# Patient Record
Sex: Male | Born: 1975 | Race: White | Hispanic: No | Marital: Married | Smoking: Current every day smoker
Health system: Southern US, Community
[De-identification: ages and names within clinical notes are randomized; demographics above are authoritative.]

## PROBLEM LIST (undated history)

## (undated) DIAGNOSIS — E785 Hyperlipidemia, unspecified: Secondary | ICD-10-CM

## (undated) DIAGNOSIS — I639 Cerebral infarction, unspecified: Secondary | ICD-10-CM

## (undated) DIAGNOSIS — E162 Hypoglycemia, unspecified: Secondary | ICD-10-CM

## (undated) DIAGNOSIS — Z9889 Other specified postprocedural states: Secondary | ICD-10-CM

## (undated) DIAGNOSIS — R112 Nausea with vomiting, unspecified: Secondary | ICD-10-CM

## (undated) HISTORY — PX: APPENDECTOMY: SHX54

## (undated) HISTORY — PX: HERNIA REPAIR: SHX51

---

## 2006-11-13 ENCOUNTER — Inpatient Hospital Stay (HOSPITAL_COMMUNITY): Admission: EM | Admit: 2006-11-13 | Discharge: 2006-11-14 | Payer: Self-pay | Admitting: Emergency Medicine

## 2006-11-13 ENCOUNTER — Encounter (INDEPENDENT_AMBULATORY_CARE_PROVIDER_SITE_OTHER): Payer: Self-pay | Admitting: *Deleted

## 2008-07-03 ENCOUNTER — Emergency Department (HOSPITAL_COMMUNITY): Admission: EM | Admit: 2008-07-03 | Discharge: 2008-07-03 | Payer: Self-pay | Admitting: Emergency Medicine

## 2009-10-23 ENCOUNTER — Emergency Department (HOSPITAL_COMMUNITY): Admission: EM | Admit: 2009-10-23 | Discharge: 2009-10-23 | Payer: Self-pay | Admitting: Emergency Medicine

## 2010-01-10 ENCOUNTER — Emergency Department (HOSPITAL_COMMUNITY): Admission: EM | Admit: 2010-01-10 | Discharge: 2010-01-10 | Payer: Self-pay | Admitting: Emergency Medicine

## 2010-05-13 ENCOUNTER — Emergency Department (HOSPITAL_COMMUNITY): Admission: EM | Admit: 2010-05-13 | Discharge: 2010-05-14 | Payer: Self-pay | Admitting: Emergency Medicine

## 2010-06-18 ENCOUNTER — Emergency Department (HOSPITAL_COMMUNITY): Admission: EM | Admit: 2010-06-18 | Discharge: 2010-06-18 | Payer: Self-pay | Admitting: Emergency Medicine

## 2010-10-15 ENCOUNTER — Emergency Department (HOSPITAL_COMMUNITY)
Admission: EM | Admit: 2010-10-15 | Discharge: 2010-10-15 | Payer: Self-pay | Source: Home / Self Care | Admitting: Emergency Medicine

## 2011-01-18 LAB — POCT I-STAT, CHEM 8
Creatinine, Ser: 1.2 mg/dL (ref 0.4–1.5)
HCT: 52 % (ref 39.0–52.0)
Hemoglobin: 17.7 g/dL — ABNORMAL HIGH (ref 13.0–17.0)
TCO2: 25 mmol/L (ref 0–100)

## 2011-01-18 LAB — URINE MICROSCOPIC-ADD ON

## 2011-01-18 LAB — URINALYSIS, ROUTINE W REFLEX MICROSCOPIC
Bilirubin Urine: NEGATIVE
Glucose, UA: NEGATIVE mg/dL
Ketones, ur: NEGATIVE mg/dL
Leukocytes, UA: NEGATIVE
Nitrite: NEGATIVE
Specific Gravity, Urine: 1.02 (ref 1.005–1.030)
Urobilinogen, UA: 0.2 mg/dL (ref 0.0–1.0)
pH: 6 (ref 5.0–8.0)

## 2011-01-20 LAB — BASIC METABOLIC PANEL
BUN: 7 mg/dL (ref 6–23)
CO2: 26 mEq/L (ref 19–32)
Calcium: 10.2 mg/dL (ref 8.4–10.5)
GFR calc Af Amer: >60 mL/min (ref >60)
GFR calc non Af Amer: >60 mL/min (ref >60)

## 2011-01-20 LAB — POCT CARDIAC MARKERS
Myoglobin, poc: 50 ng/mL (ref 12–200)
Troponin i, poc: <0.05 ng/mL (ref 0.00–0.09)

## 2011-01-20 LAB — DIFFERENTIAL
Basophils Absolute: 0.1 10*3/uL (ref 0.0–0.1)
Eosinophils Relative: 3 % (ref 0–5)
Lymphocytes Relative: 32 % (ref 12–46)
Lymphs Abs: 2.8 10*3/uL (ref 0.7–4.0)
Neutro Abs: 5 10*3/uL (ref 1.7–7.7)
Neutrophils Relative %: 58 % (ref 43–77)

## 2011-01-20 LAB — CBC
HCT: 46 % (ref 39.0–52.0)
MCH: 32.2 pg (ref 26.0–34.0)
MCHC: 35.2 g/dL (ref 30.0–36.0)
WBC: 8.6 10*3/uL (ref 4.0–10.5)

## 2011-01-20 LAB — D-DIMER, QUANTITATIVE: D-Dimer, Quant: 0.22 ug/mL-FEU (ref 0.00–0.48)

## 2011-03-22 NOTE — Discharge Summary (Signed)
NAMESEAB, Gillespie NO.:  [id number]   MEDICAL RECORD NO.:  [id number]          PATIENT TYPE:  INP   LOCATION:  4737                         FACILITY:  [facility]   PHYSICIAN:  Genene Churn. Love, M.D.    DATE OF BIRTH:  1976-04-02   DATE OF ADMISSION:  11/13/2006  DATE OF DISCHARGE:  11/14/2006                               DISCHARGE SUMMARY   HISTORY OF PRESENT ILLNESS:  This was the first [facility]  admission for this 35 year old right-handed, white, married male  admitted from the emergency room for evaluation of left hemiparesis  after being at home with that onset of left-sided weakness and numbness.  A code stroke was called.  He was brought from his home in [place] to [facility] emergency room.   HISTORY OF PRESENT ILLNESS:  [name] has no known history of high blood  pressure, diabetes, heart disease or stroke.  He has a history of  migraine headaches, and on November 12, 2006, while watching television,  he developed right frontal right greater than left sided headache,  noticed twisting and tightening of the left side of his mouth, weakness  on the left side and numbness.  He celled his personal physician who  recommended that he go to the hospital.  Code stroke was instituted in  [facility] emergency room.  Ambulance service brought him to  [facility].  His NIH stroke scale was 6.  He had no associated  history of head trauma, drug or alcohol abuse.   PAST MEDICAL HISTORY:  1. One half to one pack per day of cigarettes.  2. Penicillin allergy.  3. Appendectomy.  4. Hernia repair.  5. Migraine headaches.   ALLERGIES:  PENICILLIN, DIMETAPP AND PHENERGAN.   PHYSICAL EXAMINATION:  GENERAL:  He had a left facial contraction,  decreased vision to his left, left-sided weakness which was variable in  strength with poor effort at times and decreased pinprick on the left  side.  HIS NIH stroke scale was still 6.  It  was felt, however, that his  examination did show give-way phenomenon that was very inconsistent.   LABORATORY DATA:  His white blood cell count was 9000, hemoglobin 15.3,  hematocrit 43.3, platelets 240.  Pro time 12.7, INR was 0.9.  His PTT  was 31.  Sodium 139, potassium 3.2, chloride 107, CO2 content 24, BUN  10, creatinine 0.92, glucose 124.  Liver function tests revealed an SGOT  of 29, SGPT of 56, total protein 6.9, albumin 3.7 and calcium 9.1.  His  hemoglobin A1c was 5.0 and his CK was 158, CK-MB 1.7, relative index  1.1, troponin 0.01.  Cholesterol was 201.  His LDL was high at 132.  His  HDL was 545.  CT scan of the brain was normal.  MRI study of the brain  and intracranial MRA were normal.  A 2-D echocardiogram and Doppler  study of the carotids were normal.  His EKG showed normal sinus rhythm.  Chest x-ray showed no acute findings.  Heart size was at  the upper  limits of normal.  His drug screen was negative for any drugs, alcohol  level was less than 5.  Homocystine level was 8.9.  His urine showed  yellow clear.  No significant abnormalities.   HOSPITAL COURSE:  The patient was admitted to telemetry.  Symptoms had  begun to resolve in the emergency room.  With his history of migraines,  there was question whether or not he had a migraine TIA.  There was no  evidence of any stroke.  It was decided to place him on aspirin therapy  and ask him to discontinue cigarettes, and to have a follow-up lipid  profile in the future.   IMPRESSION:  1. Left-sided weakness and numbness (code 345.10 and 782.0), resolved      without vascular the without TIA.  2. Consider migraine (code 246. 10).  3. Hyperlipidemia (code 270.4).  4. Cigarette use (code 496).   PLAN:  Plan at this time is to discharge the patient on aspirin.  He may  return to work on Monday and drive.  Ask him to discontinue cigarettes  and to change his diet.  He should have repeat lipid profile in  approximately 3  months.  He is discharged improved from his prehospital  status .           ______________________________  Genene Churn. Sandria Manly, M.D.     JML/MEDQ  D:  11/14/2006  T:  11/14/2006  Job:  [id number]   cc:   Donna Bernard, M.D.

## 2011-03-22 NOTE — H&P (Signed)
NAMEAHAAN, Andrew Gillespie NO.:  [id number]   MEDICAL RECORD NO.:  [id number]          PATIENT TYPE:  EMS   LOCATION:  MAJO                         FACILITY:  [facility]   PHYSICIAN:  Genene Churn. Love, M.D.    DATE OF BIRTH:  11/07/1975   DATE OF ADMISSION:  11/13/2006  DATE OF DISCHARGE:                              HISTORY & PHYSICAL   This is the first [facility] admission for this 35 year old  right-handed white married male from [facility] admitted as a  code stroke by the EMT service to the [facility] emergency room  for evaluation of left hemiparesis.   HISTORY OF PRESENT ILLNESS:  Andrew Gillespie has a history of migraine  headaches during his teenage years associated with nausea and vomiting.  The evening of admission at 11:00 p.m. on November 12, 2006, he was  watching the news with his wife and noted the onset of headache in the  mid frontal region, nausea, lightheaded sensation, tightening in the  left side of his face, followed by left-sided weakness and numbness.  His primary care physician, Dr. Simone Gillespie, was called, and it is  recommended that a code stroke be activated.  From [facility], he  was brought by ambulance to [facility].  NIH stroke scale was  6.  There was no history of head trauma, drug or alcohol abuse.  He does  smoke cigarettes, a half to one pack per day.  He has no known history  of hypertension, diabetes, heart disease or prior stroke.   PAST MEDICAL HISTORY:  1. Significant for cigarette smoking, one-half to one pack per day.  2. PENICILLIN allergy.  3. Appendectomy in 1995.  4. Hernia repair in 1998.   MEDICATIONS:  He takes no medications.   ALLERGIES:  1. PENICILLIN.  2. DIMETAPP.  3. PHENERGAN.   SOCIAL HISTORY:  He does not drink alcohol.   He has been hospitalized once in the past for an allergic reaction to  Demerol and Phenergan.   FAMILY HISTORY:  His mother is 32, living and  well.  His father is 75  and has had diabetes mellitus and heart disease.  He has brothers 72, 57  and 71.  He has 3 children, daughters 35 and 7 and a son 55, and his wife  is currently pregnant.   OCCUPATIONAL HISTORY:  He works as an Research scientist (medical).   PHYSICAL EXAMINATION:  GENERAL:  A well-developed white male.  VITAL SIGNS:  Blood pressure in the right and left arm of 130/80, heart  rate 64 and regular.  There were no bruits.  NEUROLOGIC:  Mental status - he was alert and oriented x3.  Cranial  nerve examination revealed left facial, but it was a contracted face.  He had decreased vision to his left.  His tongue was midline.  His gags  were present.  Sternocleidomastoid and trapezius testing were normal.  He kept his body contracted, making it hard to evaluate.  His motor  examination revealed 5/5 strength on the  right.  He had a drift on the  left falling to the bed with his left arm and his left leg.  He had  decreased pinprick of left face, arm and leg.  Deep tendon reflexes were  2+ to 3+.  Plantar responses were downgoing.  HEART:  He had no heart murmur.  LUNGS:  Clear to auscultation.  ABDOMEN:  Examination of the abdomen revealed no enlargement of the  liver, spleen or kidneys.  Bowel sounds were normal.  GU:  He was circumcised.   LABORATORY DATA:  CT scan was normal.  White blood cell count was 9000,  hemoglobin 15.3, hematocrit 43.3, platelets 240,000.  Pro time was 12.7  with an INR of 0.9.  Sodium 139, potassium 3.2, chloride 107, CO2  content 24, BUN 10, creatinine 0.92, glucose 124.  Liver function tests  were normal.   IMPRESSION:  1. Left-sided weakness, code 342.10 with variable examination.      Consider psychosomatic versus migraine.  2. History of migraine headaches.  At this time, the patient is not a      candidate for TPA.  He is over 3 hours outside the time of onset.      A stat MRI of the brain and intracranial MRA will be  obtained.           ______________________________  Genene Churn. Sandria Manly, M.D.     JML/MEDQ  D:  11/13/2006  T:  11/13/2006  Job:  [id number]

## 2011-08-17 ENCOUNTER — Encounter: Payer: Self-pay | Admitting: *Deleted

## 2011-08-17 ENCOUNTER — Emergency Department (HOSPITAL_COMMUNITY)
Admission: EM | Admit: 2011-08-17 | Discharge: 2011-08-17 | Disposition: A | Discharge status: Home / Self Care | Payer: Self-pay | Attending: Emergency Medicine | Admitting: Emergency Medicine

## 2011-08-17 ENCOUNTER — Emergency Department (HOSPITAL_COMMUNITY): Payer: Self-pay

## 2011-08-17 DIAGNOSIS — R1031 Right lower quadrant pain: Secondary | ICD-10-CM | POA: Insufficient documentation

## 2011-08-17 DIAGNOSIS — R000 Tachycardia, unspecified: Secondary | ICD-10-CM | POA: Insufficient documentation

## 2011-08-17 DIAGNOSIS — F17200 Nicotine dependence, unspecified, uncomplicated: Secondary | ICD-10-CM | POA: Insufficient documentation

## 2011-08-17 DIAGNOSIS — R109 Unspecified abdominal pain: Secondary | ICD-10-CM | POA: Insufficient documentation

## 2011-08-17 LAB — BASIC METABOLIC PANEL
CO2: 27 mEq/L (ref 19–32)
Chloride: 107 mEq/L (ref 96–112)
Sodium: 140 mEq/L (ref 135–145)

## 2011-08-17 LAB — DIFFERENTIAL
Basophils Relative: 0 % (ref 0–1)
Eosinophils Absolute: 0.2 10*3/uL (ref 0.0–0.7)
Lymphs Abs: 1.9 10*3/uL (ref 0.7–4.0)

## 2011-08-17 LAB — CBC
HCT: 45.8 % (ref 39.0–52.0)
MCH: 31.2 pg (ref 26.0–34.0)
MCV: 90.5 fL (ref 78.0–100.0)
Platelets: 206 10*3/uL (ref 150–400)
RBC: 5.06 MIL/uL (ref 4.22–5.81)
WBC: 8.6 10*3/uL (ref 4.0–10.5)

## 2011-08-17 LAB — URINALYSIS, ROUTINE W REFLEX MICROSCOPIC
Glucose, UA: NEGATIVE mg/dL
Ketones, ur: NEGATIVE mg/dL
Nitrite: NEGATIVE

## 2011-08-17 MED ORDER — IBUPROFEN 800 MG PO TABS: 800.0000 mg | ORAL_TABLET | Freq: Three times a day (TID) | ORAL | Status: AC

## 2011-08-17 MED ORDER — HYDROMORPHONE HCL 1 MG/ML IJ SOLN
1.0000 mg | Freq: Once | INTRAMUSCULAR | Status: AC
  Administered 2011-08-17: 1 mg via INTRAVENOUS
  Filled 2011-08-17: qty 1

## 2011-08-17 MED ORDER — ONDANSETRON HCL 4 MG/2ML IJ SOLN
4.0000 mg | Freq: Once | INTRAMUSCULAR | Status: AC
  Administered 2011-08-17: 4 mg via INTRAVENOUS
  Filled 2011-08-17: qty 2

## 2011-08-17 MED ORDER — SODIUM CHLORIDE 0.9 % IV SOLN
999.0000 mL | Freq: Once | INTRAVENOUS | Status: AC
  Administered 2011-08-17: 999 mL via INTRAVENOUS

## 2011-08-17 NOTE — ED Provider Notes (Signed)
History  Scribed for Dr. Jeraldine Loots, the patient was seen in room APA15. The chart was scribed by Gilman Schmidt. The patients care was started at 1643. CSN: [id number] Arrival date & time: 08/17/2011  4:36 PM  Chief Complaint  Patient presents with  . Abdominal Pain   HPI Andrew Gillespie is a 35 y.o. male with a history of kidney stones who presents to the Emergency Department complaining of sharp right lower abdominal pain onset two days. Pain is exacerbated and shifts by movement. Symptoms are alleviated by hunching over. Additionally notes vomiting (yesterday), urinary frequency, and pressure when urinating. Denies any diarrhea, headache, confusion, difficulty breathing, swelling, or tingling. Pt reports having bladder infection at age 56. There are no other associated symptoms and no other alleviating or aggravating factors.   History reviewed. No pertinent past medical history.  Past Surgical History  Procedure Date  . Appendectomy   . Hernia repair     History reviewed. No pertinent family history.  History  Substance Use Topics  . Smoking status: Current Everyday Smoker -- 0.5 packs/day    Types: Cigarettes  . Smokeless tobacco: Not on file  . Alcohol Use: No      Review of Systems  Cardiovascular: Negative for leg swelling.  Gastrointestinal: Positive for nausea and vomiting. Negative for diarrhea.  Genitourinary: Positive for frequency. Negative for scrotal swelling, penile pain and testicular pain.       Pressure when urinating   Neurological: Negative for headaches.  Psychiatric/Behavioral: Negative for confusion.  All other systems reviewed and are negative.    Allergies  Dimetapp; Penicillins; and Phenergan  Home Medications  No current outpatient prescriptions on file.  BP 119/74  Pulse 103  Temp(Src) 98.2 F (36.8 C) (Oral)  Resp 18  Ht 5\' 9"  (1.753 m)  Wt 165 lb (74.844 kg)  BMI 24.37 kg/m2  SpO2 99%  Physical Exam  Constitutional: He is oriented to  person, place, and time. He appears well-developed and well-nourished.  Non-toxic appearance. He does not have a sickly appearance.  HENT:  Head: Normocephalic and atraumatic.  Eyes: Conjunctivae, EOM and lids are normal. Pupils are equal, round, and reactive to light.  Neck: Trachea normal, normal range of motion and full passive range of motion without pain. Neck supple.  Cardiovascular: Normal rate, regular rhythm and normal heart sounds.   No murmur heard. Pulmonary/Chest: Effort normal and breath sounds normal. No respiratory distress.  Abdominal: Soft. Normal appearance. He exhibits no distension. There is tenderness in the right lower quadrant. There is no rebound and no CVA tenderness.  Musculoskeletal: Normal range of motion.  Neurological: He is alert and oriented to person, place, and time. He has normal strength.  Skin: Skin is warm, dry and intact. No rash noted.    ED Course  Procedures  DIAGNOSTIC STUDIES: Oxygen Saturation is 99% on room air, normal by my interpretation.    COORDINATION OF CARE: 16:43  - Patient evaluated by ED physician, UA, Zofran, Dilaudid, Labs, and Chem 8 ordered  Results for orders placed during the hospital encounter of 08/17/11  URINALYSIS, ROUTINE W REFLEX MICROSCOPIC      Component Value Range   Color, Urine YELLOW  YELLOW    Appearance CLEAR  CLEAR    Specific Gravity, Urine 1.025  1.005 - 1.030    pH 6.0  5.0 - 8.0    Glucose, UA NEGATIVE  NEGATIVE (mg/dL)   Hgb urine dipstick NEGATIVE  NEGATIVE    Bilirubin Urine NEGATIVE  NEGATIVE    Ketones, ur NEGATIVE  NEGATIVE (mg/dL)   Protein, ur NEGATIVE  NEGATIVE (mg/dL)   Urobilinogen, UA 0.2  0.0 - 1.0 (mg/dL)   Nitrite NEGATIVE  NEGATIVE    Leukocytes, UA NEGATIVE  NEGATIVE   CBC      Component Value Range   WBC 8.6  4.0 - 10.5 (K/uL)   RBC 5.06  4.22 - 5.81 (MIL/uL)   Hemoglobin 15.8  13.0 - 17.0 (g/dL)   HCT 16.1  09.6 - 04.5 (%)   MCV 90.5  78.0 - 100.0 (fL)   MCH 31.2  26.0 -  34.0 (pg)   MCHC 34.5  30.0 - 36.0 (g/dL)   RDW 40.9  81.1 - 91.4 (%)   Platelets 206  150 - 400 (K/uL)  DIFFERENTIAL      Component Value Range   Neutrophils Relative 70  43 - 77 (%)   Neutro Abs 6.0  1.7 - 7.7 (K/uL)   Lymphocytes Relative 22  12 - 46 (%)   Lymphs Abs 1.9  0.7 - 4.0 (K/uL)   Monocytes Relative 5  3 - 12 (%)   Monocytes Absolute 0.5  0.1 - 1.0 (K/uL)   Eosinophils Relative 2  0 - 5 (%)   Eosinophils Absolute 0.2  0.0 - 0.7 (K/uL)   Basophils Relative 0  0 - 1 (%)   Basophils Absolute 0.0  0.0 - 0.1 (K/uL)      No diagnosis found.  I personally performed the services described in this documentation, which was scribed in my presence. The recorded information has been reviewed and considered.  CT: No acute findings  MDM  This generally well-appearing male presents with right lower quadrant pain and right flank pain for the past 2 days. I exam the patient is in no distress, and is noted to be tachycardic on initial exam. His evaluation does not demonstrate acute findings. The absence of acute findings, as well as the distress is suggestive of a musculoskeletal etiology of the patient's pain.  Absent acute findings, and with no suggestion of acute GU pathology, the patient will be discharged to follow up with primary care physician in 2 days. He will receive anti-inflammatories instructions for pain control.      Gerhard Munch, MD 08/17/11 [phone]

## 2011-08-17 NOTE — ED Provider Notes (Signed)
History     CSN: [id number] Arrival date & time: 08/17/2011  4:36 PM  Chief Complaint  Patient presents with  . Abdominal Pain    (Consider location/radiation/quality/duration/timing/severity/associated sxs/prior treatment) HPI  History reviewed. No pertinent past medical history.  Past Surgical History  Procedure Date  . Appendectomy   . Hernia repair     History reviewed. No pertinent family history.  History  Substance Use Topics  . Smoking status: Current Everyday Smoker -- 0.5 packs/day    Types: Cigarettes  . Smokeless tobacco: Not on file  . Alcohol Use: No      Review of Systems  Allergies  Dimetapp; Penicillins; and Phenergan  Home Medications  No current outpatient prescriptions on file.  BP 119/74  Pulse 103  Temp(Src) 98.2 F (36.8 C) (Oral)  Resp 18  Ht 5\' 9"  (1.753 m)  Wt 165 lb (74.844 kg)  BMI 24.37 kg/m2  SpO2 99%  Physical Exam  ED Course  Procedures   Results for orders placed during the hospital encounter of 08/17/11  URINALYSIS, ROUTINE W REFLEX MICROSCOPIC      Component Value Range   Color, Urine YELLOW  YELLOW    Appearance CLEAR  CLEAR    Specific Gravity, Urine 1.025  1.005 - 1.030    pH 6.0  5.0 - 8.0    Glucose, UA NEGATIVE  NEGATIVE (mg/dL)   Hgb urine dipstick NEGATIVE  NEGATIVE    Bilirubin Urine NEGATIVE  NEGATIVE    Ketones, ur NEGATIVE  NEGATIVE (mg/dL)   Protein, ur NEGATIVE  NEGATIVE (mg/dL)   Urobilinogen, UA 0.2  0.0 - 1.0 (mg/dL)   Nitrite NEGATIVE  NEGATIVE    Leukocytes, UA NEGATIVE  NEGATIVE   CBC      Component Value Range   WBC 8.6  4.0 - 10.5 (K/uL)   RBC 5.06  4.22 - 5.81 (MIL/uL)   Hemoglobin 15.8  13.0 - 17.0 (g/dL)   HCT 78.2  95.6 - 21.3 (%)   MCV 90.5  78.0 - 100.0 (fL)   MCH 31.2  26.0 - 34.0 (pg)   MCHC 34.5  30.0 - 36.0 (g/dL)   RDW 08.6  57.8 - 46.9 (%)   Platelets 206  150 - 400 (K/uL)  DIFFERENTIAL      Component Value Range   Neutrophils Relative 70  43 - 77 (%)   Neutro Abs  6.0  1.7 - 7.7 (K/uL)   Lymphocytes Relative 22  12 - 46 (%)   Lymphs Abs 1.9  0.7 - 4.0 (K/uL)   Monocytes Relative 5  3 - 12 (%)   Monocytes Absolute 0.5  0.1 - 1.0 (K/uL)   Eosinophils Relative 2  0 - 5 (%)   Eosinophils Absolute 0.2  0.0 - 0.7 (K/uL)   Basophils Relative 0  0 - 1 (%)   Basophils Absolute 0.0  0.0 - 0.1 (K/uL)   Ct Abdomen Pelvis Wo Contrast  08/17/2011  *RADIOLOGY REPORT*  Clinical Data: Right side flank pain.  CT ABDOMEN AND PELVIS WITHOUT CONTRAST  Technique:  Multidetector CT imaging of the abdomen and pelvis was performed following the standard protocol without intravenous contrast.  Comparison: 06/18/2010  Findings: The liver and spleen have normal uninfused features.  The stomach, duodenum, pancreas, gallbladder, and adrenal glands are normal.  No abnormalities seen in either kidney on the study without contrast.  Specifically, there is no evidence for hydronephrosis or renal stones.  No stones are seen in either ureter and there  is no evidence for stone disease in the bladder.  No abdominal aortic aneurysm.  No free fluid or lymphadenopathy in the abdomen.  Abdominal bowel loops are unremarkable.  Imaging through the pelvis shows no free intraperitoneal fluid.  No pelvic sidewall lymphadenopathy.  No appreciable diverticular disease in the colon.  There is no colonic diverticulitis. Terminal ileum is unremarkable. Nonvisualization of the appendix is consistent with the reported history of appendectomy.  Bone windows reveal no worrisome lytic or sclerotic osseous lesions.  IMPRESSION: No acute findings in the abdomen or pelvis.  Specifically, no findings to explain the patient's history of right flank pain.  Original Report Authenticated By: ERIC A. MANSELL, M.D.     No diagnosis found.    MDM  This well-appearing male presents with right lower quadrant pain, principally about the inguinal crease. His radiographic and laboratory studies are unremarkable. The patient  achieved some pain relief in the ED. He was discharged with diagnosis of abdominal pain. This provided excellent return precautions, and will follow up with his primary care physician.        Gerhard Munch, MD 08/18/11 [phone]

## 2011-08-17 NOTE — ED Notes (Signed)
Pt c/o lower abdominal pain, urinary frequency and painful urination x 2 days. Also c/o nausea.

## 2011-11-10 ENCOUNTER — Emergency Department (HOSPITAL_COMMUNITY)
Admission: EM | Admit: 2011-11-10 | Discharge: 2011-11-11 | Disposition: A | Discharge status: Home / Self Care | Payer: Self-pay | Attending: Emergency Medicine | Admitting: Emergency Medicine

## 2011-11-10 ENCOUNTER — Encounter (HOSPITAL_COMMUNITY): Payer: Self-pay | Admitting: Emergency Medicine

## 2011-11-10 DIAGNOSIS — M5380 Other specified dorsopathies, site unspecified: Secondary | ICD-10-CM | POA: Insufficient documentation

## 2011-11-10 DIAGNOSIS — F17200 Nicotine dependence, unspecified, uncomplicated: Secondary | ICD-10-CM | POA: Insufficient documentation

## 2011-11-10 DIAGNOSIS — M62830 Muscle spasm of back: Secondary | ICD-10-CM

## 2011-11-10 DIAGNOSIS — M549 Dorsalgia, unspecified: Secondary | ICD-10-CM | POA: Insufficient documentation

## 2011-11-10 NOTE — ED Notes (Signed)
Patient complaining of lower back pain x 2 days. Denies injury. Also states "when I bend my neck forward, I feel a pulling in the back of my legs and my toes go numb."

## 2011-11-11 MED ORDER — TRAMADOL-ACETAMINOPHEN 37.5-325 MG PO TABS: ORAL_TABLET | ORAL | Status: AC

## 2011-11-11 MED ORDER — KETOROLAC TROMETHAMINE 60 MG/2ML IM SOLN
60.0000 mg | Freq: Once | INTRAMUSCULAR | Status: AC
  Administered 2011-11-11: 60 mg via INTRAMUSCULAR
  Filled 2011-11-11: qty 2

## 2011-11-11 MED ORDER — CYCLOBENZAPRINE HCL 10 MG PO TABS: 10.0000 mg | ORAL_TABLET | Freq: Three times a day (TID) | ORAL | Status: AC | PRN

## 2011-11-11 MED ORDER — DIAZEPAM 5 MG PO TABS
10.0000 mg | ORAL_TABLET | Freq: Once | ORAL | Status: AC
  Administered 2011-11-11: 10 mg via ORAL
  Filled 2011-11-11: qty 2

## 2011-11-11 MED ORDER — MORPHINE SULFATE 4 MG/ML IJ SOLN
4.0000 mg | Freq: Once | INTRAMUSCULAR | Status: AC
  Administered 2011-11-11: 4 mg via INTRAMUSCULAR
  Filled 2011-11-11: qty 1

## 2011-11-11 NOTE — ED Provider Notes (Signed)
History     CSN: [id number]  Arrival date & time 11/10/11  2345   First MD Initiated Contact with Patient 11/11/11 0023      Chief Complaint  Patient presents with  . Back Pain    (Consider location/radiation/quality/duration/timing/severity/associated sxs/prior treatment) HPI  Patient relates he works at [employer] and does a lot of lifting, however he does not recall a specific injury or problem. He relates he started getting discomfort in the right side of his back a couple days ago this got progressively worse. He states this morning he was unable to get out of bed without assistance from his wife. He states when he flexes his head forward he gets pain in his legs and his toes go numb. He denies any prior history of back problems  PCP Dr. Lubertha South  History reviewed. No pertinent past medical history.  Past Surgical History  Procedure Date  . Appendectomy   . Hernia repair     History reviewed. No pertinent family history.  History  Substance Use Topics  . Smoking status: Current Everyday Smoker -- 0.5 packs/day    Types: Cigarettes  . Smokeless tobacco: Not on file  . Alcohol Use: No  Employed Lives with spouse    Review of Systems  All other systems reviewed and are negative.    Allergies  Dimetapp; Penicillins; and Phenergan  Home Medications  No current outpatient prescriptions on file.  BP 118/80  Pulse 84  Temp(Src) 97.8 F (36.6 C) (Oral)  Resp 16  Ht 5\' 10"  (1.778 m)  Wt 175 lb (79.379 kg)  BMI 25.11 kg/m2  SpO2 98%  Vital signs normal    Physical Exam  Nursing note and vitals reviewed. Constitutional: He is oriented to person, place, and time. He appears well-developed and well-nourished.  Non-toxic appearance. He does not appear ill. No distress.  HENT:  Head: Normocephalic and atraumatic.  Right Ear: External ear normal.  Left Ear: External ear normal.  Nose: Nose normal. No mucosal edema or rhinorrhea.  Mouth/Throat: Oropharynx  is clear and moist and mucous membranes are normal. No dental abscesses or uvula swelling.  Eyes: Conjunctivae and EOM are normal. Pupils are equal, round, and reactive to light.  Neck: Normal range of motion and full passive range of motion without pain. Neck supple.  Cardiovascular: Normal rate, regular rhythm and normal heart sounds.  Exam reveals no gallop and no friction rub.   No murmur heard. Pulmonary/Chest: Effort normal and breath sounds normal. No respiratory distress. He has no wheezes. He has no rhonchi. He has no rales. He exhibits no tenderness and no crepitus.  Abdominal: Soft. Normal appearance and bowel sounds are normal. He exhibits no distension. There is no tenderness. There is no rebound and no guarding.  Musculoskeletal: Normal range of motion. He exhibits no edema and no tenderness.       Moves all extremities well. Patient has 1+ patellar reflexes bilaterally. No pain with straight leg raising. Patient has some pain on range of motion of the waist especially when he flexes forward then to the left and the least painful is to the right. He's tender diffusely along his trapezius muscle on the right and extends along the paraspinous muscles on the right into the lower lumbar region.These muscles are the areas that he states are painful.  Neurological: He is alert and oriented to person, place, and time. He has normal strength. No cranial nerve deficit.  Skin: Skin is warm, dry and intact. No  rash noted. No erythema. No pallor.  Psychiatric: He has a normal mood and affect. His speech is normal and behavior is normal. His mood appears not anxious.    ED Course  Procedures (including critical care time)  Pt had CT AP in October, no boney abnormality was noted at that time.  Pt agreeable to getting IM meds, states his wife can drive him home.    Medications  ketorolac (TORADOL) injection 60 mg (not administered)  diazepam (VALIUM) tablet 10 mg (not administered)  morphine  4 MG/ML injection 4 mg (not administered)     Diagnoses that have been ruled out:  Diagnoses that are still under consideration:  Final diagnoses:  Back pain  Muscle spasm of back   New Prescriptions   CYCLOBENZAPRINE (FLEXERIL) 10 MG TABLET    Take 1 tablet (10 mg total) by mouth 3 (three) times daily as needed for muscle spasms.   TRAMADOL-ACETAMINOPHEN (ULTRACET) 37.5-325 MG PER TABLET    2 tabs po QID prn pain   Plan discharge Devoria Albe, MD, Armando Gang    MDM          Ward Givens, MD 11/11/11 0100

## 2012-07-02 ENCOUNTER — Emergency Department (HOSPITAL_COMMUNITY)
Admission: EM | Admit: 2012-07-02 | Discharge: 2012-07-03 | Disposition: A | Discharge status: Home / Self Care | Payer: Self-pay | Attending: Emergency Medicine | Admitting: Emergency Medicine

## 2012-07-02 ENCOUNTER — Encounter (HOSPITAL_COMMUNITY): Payer: Self-pay

## 2012-07-02 DIAGNOSIS — W5501XA Bitten by cat, initial encounter: Secondary | ICD-10-CM

## 2012-07-02 DIAGNOSIS — F17200 Nicotine dependence, unspecified, uncomplicated: Secondary | ICD-10-CM | POA: Insufficient documentation

## 2012-07-02 DIAGNOSIS — S61209A Unspecified open wound of unspecified finger without damage to nail, initial encounter: Secondary | ICD-10-CM | POA: Insufficient documentation

## 2012-07-02 DIAGNOSIS — IMO0001 Reserved for inherently not codable concepts without codable children: Secondary | ICD-10-CM | POA: Insufficient documentation

## 2012-07-02 DIAGNOSIS — Z880 Allergy status to penicillin: Secondary | ICD-10-CM | POA: Insufficient documentation

## 2012-07-02 DIAGNOSIS — Y92009 Unspecified place in unspecified non-institutional (private) residence as the place of occurrence of the external cause: Secondary | ICD-10-CM | POA: Insufficient documentation

## 2012-07-02 NOTE — ED Notes (Addendum)
Pt bitten by kitten small puncture wound noted to right pinky finger. Andrew Gillespie is owned by patient.

## 2012-07-03 MED ORDER — CIPROFLOXACIN HCL 500 MG PO TABS: 500.0000 mg | ORAL_TABLET | Freq: Two times a day (BID) | ORAL | Status: DC

## 2012-07-03 MED ORDER — CIPROFLOXACIN HCL 250 MG PO TABS
500.0000 mg | ORAL_TABLET | Freq: Once | ORAL | Status: AC
  Administered 2012-07-03: 500 mg via ORAL
  Filled 2012-07-03: qty 2

## 2012-07-03 MED ORDER — CLINDAMYCIN HCL 150 MG PO CAPS
150.0000 mg | ORAL_CAPSULE | Freq: Four times a day (QID) | ORAL | Status: DC
Start: 2012-07-03 — End: 2012-07-08

## 2012-07-03 MED ORDER — CLINDAMYCIN HCL 150 MG PO CAPS
300.0000 mg | ORAL_CAPSULE | Freq: Once | ORAL | Status: AC
  Administered 2012-07-03: 300 mg via ORAL
  Filled 2012-07-03: qty 2

## 2012-07-03 NOTE — ED Provider Notes (Signed)
History     CSN: [id number]  Arrival date & time 07/02/12  2259   First MD Initiated Contact with Patient 07/02/12 2349      Chief Complaint  Patient presents with  . Animal Bite    (Consider location/radiation/quality/duration/timing/severity/associated sxs/prior treatment) HPI BODI PALMERI is a 36 y.o. male who presents to the Emergency Department complaining of cat bite to little finger on right hand that occurred as he was feeding the cats. Cat is no immunized but lives at the home.   PCP Dr. Gerda Diss History reviewed. No pertinent past medical history.  Past Surgical History  Procedure Date  . Appendectomy   . Hernia repair     No family history on file.  History  Substance Use Topics  . Smoking status: Current Everyday Smoker -- 0.5 packs/day    Types: Cigarettes  . Smokeless tobacco: Not on file  . Alcohol Use: No      Review of Systems  Constitutional: Negative for fever.       10 Systems reviewed and are negative for acute change except as noted in the HPI.  HENT: Negative for congestion.   Eyes: Negative for discharge and redness.  Respiratory: Negative for cough and shortness of breath.   Cardiovascular: Negative for chest pain.  Gastrointestinal: Negative for vomiting and abdominal pain.  Musculoskeletal: Negative for back pain.  Skin: Negative for rash.       Puncture wound to 5th right finger  Neurological: Negative for syncope, numbness and headaches.  Psychiatric/Behavioral:       No behavior change.    Allergies  Dimetapp; Penicillins; and Promethazine hcl  Home Medications  No current outpatient prescriptions on file.  BP 115/75  Pulse 90  Temp 98.2 F (36.8 C) (Oral)  Resp 18  Ht 5\' 9"  (1.753 m)  Wt 170 lb (77.111 kg)  BMI 25.10 kg/m2  SpO2 97%  Physical Exam  Nursing note and vitals reviewed. Constitutional: He appears well-developed and well-nourished.       Awake, alert, nontoxic appearance.  HENT:  Head: Atraumatic.    Eyes: Right eye exhibits no discharge. Left eye exhibits no discharge.  Neck: Neck supple.  Cardiovascular: Normal heart sounds.   Pulmonary/Chest: Effort normal and breath sounds normal. He exhibits no tenderness.  Abdominal: Soft. There is no tenderness. There is no rebound.  Musculoskeletal: He exhibits no tenderness.       Baseline ROM, no obvious new focal weakness.  Neurological:       Mental status and motor strength appears baseline for patient and situation.  Skin: No rash noted.       10 mm scratch/puncture wound to right 5th finger.  Psychiatric: He has a normal mood and affect.    ED Course  Procedures (including critical care time)  Labs Reviewed - No data to display No results found.   No diagnosis found.    MDM  Patient with cat bite to right 5th finger. He is allergic to penicillin. Initiated treatment with clindamycin and ciprofloxacin. Patient to notify animal control for quarantine of cat. Pt stable in ED with no significant deterioration in condition.The patient appears reasonably screened and/or stabilized for discharge and I doubt any other medical condition or other [facility] requiring further screening, evaluation, or treatment in the ED at this time prior to discharge.  MDM Reviewed: nursing note and vitals             Nicoletta Dress. Colon Branch, MD 07/03/12 [id number]

## 2012-07-08 ENCOUNTER — Encounter (HOSPITAL_COMMUNITY): Payer: Self-pay | Admitting: Emergency Medicine

## 2012-07-08 DIAGNOSIS — Z23 Encounter for immunization: Secondary | ICD-10-CM | POA: Insufficient documentation

## 2012-07-08 DIAGNOSIS — S61209A Unspecified open wound of unspecified finger without damage to nail, initial encounter: Secondary | ICD-10-CM | POA: Insufficient documentation

## 2012-07-08 DIAGNOSIS — F17200 Nicotine dependence, unspecified, uncomplicated: Secondary | ICD-10-CM | POA: Insufficient documentation

## 2012-07-08 DIAGNOSIS — IMO0001 Reserved for inherently not codable concepts without codable children: Secondary | ICD-10-CM | POA: Insufficient documentation

## 2012-07-08 NOTE — ED Notes (Signed)
Pt was bit by cat last Thursday; seen at [facility], reports feeling strange so came here for evaluation.

## 2012-07-09 ENCOUNTER — Emergency Department (HOSPITAL_COMMUNITY)
Admission: EM | Admit: 2012-07-09 | Discharge: 2012-07-09 | Disposition: A | Discharge status: Home / Self Care | Payer: Self-pay | Attending: Emergency Medicine | Admitting: Emergency Medicine

## 2012-07-09 DIAGNOSIS — Z23 Encounter for immunization: Secondary | ICD-10-CM

## 2012-07-09 DIAGNOSIS — W5501XA Bitten by cat, initial encounter: Secondary | ICD-10-CM

## 2012-07-09 MED ORDER — RABIES VACCINE, PCEC IM SUSR
1.0000 mL | Freq: Once | INTRAMUSCULAR | Status: AC
  Administered 2012-07-09: 1 mL via INTRAMUSCULAR
  Filled 2012-07-09: qty 1

## 2012-07-09 MED ORDER — RABIES IMMUNE GLOBULIN 150 UNIT/ML IM INJ
1500.0000 [IU] | INJECTION | Freq: Once | INTRAMUSCULAR | Status: AC
  Administered 2012-07-09: 1500 [IU] via INTRAMUSCULAR
  Filled 2012-07-09: qty 10

## 2012-07-09 NOTE — ED Notes (Signed)
Rabies schedule faxed to pharmacy and to [facility], but pt now sts going to [place] for follow up

## 2012-07-09 NOTE — ED Notes (Signed)
Message left on [facility] mail[facility] at [facility] about f/u for patient.

## 2012-07-09 NOTE — ED Provider Notes (Signed)
History     CSN: [id number]  Arrival date & time 07/08/12  2244   First MD Initiated Contact with Patient 07/09/12 0132      Chief Complaint  Patient presents with  . Animal Bite    (Consider location/radiation/quality/duration/timing/severity/associated sxs/prior treatment) HPI Comments: Patient presents emergency department and status post cat bite to right fifth finger that occurred about one week ago.  Patient was evaluated at [facility] and discharged on clindamycin and Cipro do to a penicillin allergy.  Patient reports that they are uncomfortable notifying animal control because they have many stray cats and they do not want the wrong to be caught.  He reports feeling "funny" and he was advised to return to the emergency department by his PCP.  Patient denies any fevers, night sweats, chills, nausea, vomiting, diarrhea, decreased appetite, headache, sore throat, mental status changes, confusion, hyperactivity, seizures or paralysis.  Patient is a 36 y.o. male presenting with animal bite. The history is provided by the patient.  Animal Bite     History reviewed. No pertinent past medical history.  Past Surgical History  Procedure Date  . Appendectomy   . Hernia repair     History reviewed. No pertinent family history.  History  Substance Use Topics  . Smoking status: Current Everyday Smoker -- 0.5 packs/day    Types: Cigarettes  . Smokeless tobacco: Not on file  . Alcohol Use: No      Review of Systems  All other systems reviewed and are negative.    Allergies  Dimetapp; Penicillins; and Promethazine hcl  Home Medications   Current Outpatient Rx  Name Route Sig Dispense Refill  . CIPROFLOXACIN HCL 500 MG PO TABS Oral Take 500 mg by mouth 2 (two) times daily. For 7 days; Start date 07/04/12    . CLINDAMYCIN HCL 150 MG PO CAPS Oral Take 150 mg by mouth every 6 (six) hours. For 7 days; Start date 07/04/12      BP 138/74  Pulse 94  Temp 98 F (36.7 C) (Oral)   Resp 20  SpO2 98%  Physical Exam  Constitutional: He is oriented to person, place, and time. Vital signs are normal. He appears well-developed and well-nourished. No distress.  HENT:  Head: Normocephalic and atraumatic.  Mouth/Throat: Oropharynx is clear and moist. No oropharyngeal exudate.  Eyes: Conjunctivae and EOM are normal. Pupils are equal, round, and reactive to light. No scleral icterus.       No anisocoria or mydriasis   Neck: Normal range of motion. Neck supple. No tracheal deviation present. No thyromegaly present.  Cardiovascular: Normal rate, regular rhythm, normal heart sounds and intact distal pulses.   Pulmonary/Chest: Effort normal and breath sounds normal. No stridor. No respiratory distress. He has no wheezes.  Abdominal: Soft.  Musculoskeletal: Normal range of motion. He exhibits no edema and no tenderness.  Neurological: He is alert and oriented to person, place, and time. Coordination normal.  Skin: Skin is warm and dry. No rash noted. He is not diaphoretic. No erythema. No pallor.       Cat bite ~1mm on right 5th finger healing well and is non ttp, without warmth erythema or purulent drainage  Psychiatric: He has a normal mood and affect. His behavior is normal.       Normal mood & affect, normal mental status    ED Course  Procedures (including critical care time)  Labs Reviewed - No data to display No results found.   No diagnosis found.  MDM  Cat bite  Pt not willing to call animal control and quarantine feral cat. No current signs of rabies infection. Pt in NAD with normal VS. Will treat in ED with rabies vaccine and dc with specific shot schedule. Puncture wound examined and healing appropriately. Pt advised to continue taking antibiotics as directed by previous provider. Tdap up to date per pt.         Jaci Carrel, [place] 07/09/12 [phone]

## 2012-07-10 ENCOUNTER — Other Ambulatory Visit (HOSPITAL_COMMUNITY): Payer: Self-pay | Admitting: *Deleted

## 2012-07-10 NOTE — Progress Notes (Signed)
Looking for rabies vaccine orders, none seen in order review

## 2012-07-10 NOTE — ED Provider Notes (Signed)
History/physical exam/procedure(s) were performed by non-physician practitioner and as supervising physician I was immediately available for consultation/collaboration. I have reviewed all notes and am in agreement with care and plan.   Hilario Quarry, MD 07/10/12 [phone]

## 2012-07-12 ENCOUNTER — Ambulatory Visit (HOSPITAL_COMMUNITY): Payer: Self-pay

## 2012-07-13 ENCOUNTER — Encounter (HOSPITAL_COMMUNITY): Payer: Self-pay | Attending: Emergency Medicine

## 2012-07-13 VITALS — BP 116/70 | HR 92 | Temp 98.5°F | Resp 18

## 2012-07-13 DIAGNOSIS — Z23 Encounter for immunization: Secondary | ICD-10-CM | POA: Insufficient documentation

## 2012-07-13 DIAGNOSIS — IMO0001 Reserved for inherently not codable concepts without codable children: Secondary | ICD-10-CM | POA: Insufficient documentation

## 2012-07-13 DIAGNOSIS — W5501XA Bitten by cat, initial encounter: Secondary | ICD-10-CM

## 2012-07-13 MED ORDER — RABIES VACCINE, PCEC IM SUSR
1.0000 mL | Freq: Once | INTRAMUSCULAR | Status: AC
  Administered 2012-07-13: 1 mL via INTRAMUSCULAR
  Filled 2012-07-13: qty 1

## 2012-07-13 NOTE — Progress Notes (Signed)
Andrew Gillespie presents today for injection per MD orders. Rabies Vaccine 1ml administered SQ in left Upper Arm. Administration without incident. Patient tolerated well.

## 2012-07-16 ENCOUNTER — Encounter (HOSPITAL_COMMUNITY): Payer: Self-pay

## 2012-07-16 DIAGNOSIS — W5501XA Bitten by cat, initial encounter: Secondary | ICD-10-CM

## 2012-07-16 MED ORDER — RABIES VACCINE, PCEC IM SUSR
1.0000 mL | Freq: Once | INTRAMUSCULAR | Status: AC
  Administered 2012-07-16: 1 mL via INTRAMUSCULAR
  Filled 2012-07-16: qty 1

## 2012-07-16 NOTE — Progress Notes (Signed)
Andrew Gillespie presents today for injection per MD orders. rab avert administered im  in right Upper Arm. Administration without incident. Patient tolerated well.

## 2012-07-23 ENCOUNTER — Ambulatory Visit (HOSPITAL_COMMUNITY): Payer: Self-pay

## 2013-02-16 ENCOUNTER — Ambulatory Visit (INDEPENDENT_AMBULATORY_CARE_PROVIDER_SITE_OTHER): Payer: BC Managed Care – PPO | Admitting: Family Medicine

## 2013-02-16 ENCOUNTER — Encounter: Payer: Self-pay | Admitting: Family Medicine

## 2013-02-16 VITALS — BP 112/86 | HR 80 | Ht 68.0 in | Wt 189.2 lb

## 2013-02-16 DIAGNOSIS — K219 Gastro-esophageal reflux disease without esophagitis: Secondary | ICD-10-CM

## 2013-02-16 DIAGNOSIS — Z0000 Encounter for general adult medical examination without abnormal findings: Secondary | ICD-10-CM

## 2013-02-16 MED ORDER — PANTOPRAZOLE SODIUM 40 MG PO TBEC: 40.0000 mg | DELAYED_RELEASE_TABLET | Freq: Every day | ORAL | Status: DC

## 2013-02-16 NOTE — Progress Notes (Signed)
  Subjective:    Patient ID: Andrew Reach Sr., male    DOB: 1976/01/30, 37 y.o.   MRN: [id number]  HPI Unfortunately smokes a half a pack per day. On feet al days. Working third shift. Father recently sick with diabetes. Pill only. Mo on insulin. No focused exercise. Review of Systems  Constitutional: Negative for fever, activity change and appetite change.  HENT: Negative for congestion, rhinorrhea and neck pain.   Eyes: Negative for discharge.  Respiratory: Negative for cough and wheezing.   Cardiovascular: Negative for chest pain.  Gastrointestinal: Negative for vomiting, abdominal pain and blood in stool.  Genitourinary: Negative for frequency and difficulty urinating.  Skin: Negative for rash.  Allergic/Immunologic: Negative for environmental allergies and food allergies.  Neurological: Negative for weakness and headaches.  Psychiatric/Behavioral: Negative for agitation.   Heartburn difficult. Zantac and prilocec not helping. Omeprazole not helping.   no fam hx of pros or colcon ca Objective:   Physical Exam  Constitutional: He appears well-developed and well-nourished.  HENT:  Head: Normocephalic and atraumatic.  Right Ear: External ear normal.  Left Ear: External ear normal.  Nose: Nose normal.  Mouth/Throat: Oropharynx is clear and moist.  Eyes: EOM are normal. Pupils are equal, round, and reactive to light.  Neck: Normal range of motion. Neck supple. No thyromegaly present.  Cardiovascular: Normal rate, regular rhythm and normal heart sounds.   No murmur heard. Pulmonary/Chest: Effort normal and breath sounds normal. No respiratory distress. He has no wheezes.  Abdominal: Soft. Bowel sounds are normal. He exhibits no distension and no mass. There is no tenderness.  Genitourinary: Penis normal.  Musculoskeletal: Normal range of motion. He exhibits no edema.  Lymphadenopathy:    He has no cervical adenopathy.  Neurological: He is alert. He exhibits normal muscle  tone.  Skin: Skin is warm and dry. No erythema.  Psychiatric: He has a normal mood and affect. His behavior is normal. Judgment normal.          Assessment & Plan:  Impression adult physical. #2 progressive reflux unresponsive to over-the-counter meds. Plan encouraged to stop smoking. Information given. Protonic 40 mg daily. Cut down caffeine of possible. WSL

## 2013-02-16 NOTE — Patient Instructions (Signed)

## 2013-02-18 LAB — LIPID PANEL
Cholesterol: 206 mg/dL — ABNORMAL HIGH (ref 0–200)
Total CHOL/HDL Ratio: 4.1 Ratio
Triglycerides: 220 mg/dL — ABNORMAL HIGH (ref <150)
VLDL: 44 mg/dL — ABNORMAL HIGH (ref 0–40)

## 2013-02-23 ENCOUNTER — Encounter: Payer: Self-pay | Admitting: Family Medicine

## 2013-02-25 ENCOUNTER — Encounter: Payer: Self-pay | Admitting: Family Medicine

## 2014-02-21 ENCOUNTER — Encounter (HOSPITAL_COMMUNITY): Payer: Self-pay | Admitting: Emergency Medicine

## 2014-02-21 ENCOUNTER — Emergency Department (HOSPITAL_COMMUNITY)
Admission: EM | Admit: 2014-02-21 | Discharge: 2014-02-21 | Disposition: A | Discharge status: Home / Self Care | Payer: Self-pay | Attending: Emergency Medicine | Admitting: Emergency Medicine

## 2014-02-21 DIAGNOSIS — M7710 Lateral epicondylitis, unspecified elbow: Secondary | ICD-10-CM | POA: Insufficient documentation

## 2014-02-21 DIAGNOSIS — M7711 Lateral epicondylitis, right elbow: Secondary | ICD-10-CM

## 2014-02-21 DIAGNOSIS — Y929 Unspecified place or not applicable: Secondary | ICD-10-CM | POA: Insufficient documentation

## 2014-02-21 DIAGNOSIS — Z8781 Personal history of (healed) traumatic fracture: Secondary | ICD-10-CM | POA: Insufficient documentation

## 2014-02-21 DIAGNOSIS — Y990 Civilian activity done for income or pay: Secondary | ICD-10-CM | POA: Insufficient documentation

## 2014-02-21 DIAGNOSIS — F17200 Nicotine dependence, unspecified, uncomplicated: Secondary | ICD-10-CM | POA: Insufficient documentation

## 2014-02-21 DIAGNOSIS — IMO0002 Reserved for concepts with insufficient information to code with codable children: Secondary | ICD-10-CM | POA: Insufficient documentation

## 2014-02-21 DIAGNOSIS — Z880 Allergy status to penicillin: Secondary | ICD-10-CM | POA: Insufficient documentation

## 2014-02-21 DIAGNOSIS — Z79899 Other long term (current) drug therapy: Secondary | ICD-10-CM | POA: Insufficient documentation

## 2014-02-21 DIAGNOSIS — Y9389 Activity, other specified: Secondary | ICD-10-CM | POA: Insufficient documentation

## 2014-02-21 MED ORDER — IBUPROFEN 800 MG PO TABS
800.0000 mg | ORAL_TABLET | Freq: Once | ORAL | Status: AC
  Administered 2014-02-21: 800 mg via ORAL
  Filled 2014-02-21: qty 1

## 2014-02-21 MED ORDER — IBUPROFEN 800 MG PO TABS: 800.0000 mg | ORAL_TABLET | Freq: Three times a day (TID) | ORAL | Status: DC

## 2014-02-21 NOTE — ED Notes (Signed)
No elbow brace available in this facility. Notified Dr. Marnette Burgess. Stated to have patient follow up with Dr. Aline Brochure.

## 2014-02-21 NOTE — Discharge Instructions (Signed)
Repetitive Strain Injuries  Repetitive strain injuries (RSIs) result from overuse or misuse of soft tissues including muscles, tendons, or nerves. Tendons are the cord-like structures that attach muscles to bones. RSIs can affect almost any part of the body. However, RSIs are most common in the arms (thumbs, wrists, elbows, shoulders) and legs (ankles, knees). Common medical conditions that are often caused by repetitive strain include carpal tunnel syndrome, tennis or golfer's elbow, bursitis, and tendonitis. If RSIs are treated early, and therepeated activity is reduced or removed, the severity and length of your problems can usually be reduced. RSIs are also called cumulative trauma disorders (CTD).   CAUSES   Many RSIs occur due to repeating the same activity at work over weeks or months without sufficient rest, such as prolonged typing. RSIs also commonly occur when a hobby or sport is done repeatedly without sufficient rest. RSIs can also occur due to repeated strain or stress on a body part in someone who has one or more risk factors for RSIs.  RISK FACTORS  Workplace risk factors   Frequent computer use, especially if your workstation is not adjusted for your body type.   Infrequent rest breaks.   Working in a high-pressure environment.   Working at a fast pace.   Repeating the same motion, such as frequent typing.   Working in an awkward position or holding the same position for a long time.   Forceful movements such as lifting, pulling, or pushing.   Vibration caused by using power tools.   Working in cold temperatures.   Job stress.  Personal risk factors   Poor posture.   Being loose-jointed.   Not exercising regularly.   Being overweight.   Arthritis, diabetes, thyroid problems, or other long-term (chronic)medical conditions.   Vitamin deficiencies.   Keeping your fingernails long.   An unhealthy, stressful, or inactive lifestyle.   Not sleeping well.  SYMPTOMS   Symptoms often  begin at work but become more noticeable after the repeated stress has ended. For example, you may develop fatigue or soreness in your wrist while typingat work, and at night you may develop numbness and tingling in your fingers. Common symptoms include:    Burning, shooting, or aching pain, especially in the fingers, palms, wrists, forearms, or shoulders.   Tenderness.   Swelling.   Tingling, numbness, or loss of feeling.   Pain with certain activities, such as turning a doorknob or reaching above your head.   Weakness, heaviness, or loss of coordination in yourhand.   Muscle spasms or tightness.  In some cases, symptoms can become so intense that it is difficult to perform everyday tasks. Symptoms that do not improve with rest may indicate a more serious condition.   DIAGNOSIS   Your caregiver may determine the type ofRSI you have based on your medical evaluation and a description of your activities.   TREATMENT   Treatment depends on the severity and type of RSI you have. Your caregiver may recommend rest for the affected body part, medicines, and physical or occupational therapy to reduce pain, swelling, and soreness. Discuss the activities you do repeatedly with your caregiver. Your caregiver can help you decide whether you need to change your activities. An RSI may take months or years to heal, especially if the affected body part gets insufficient rest. In some cases, such as severe carpal tunnel syndrome, surgery may be recommended.  PREVENTION   Talk with your supervisor to make sure you have the proper equipment   for your work station.   Maintain good posture at your desk or work station with:   Feet flat on the floor.   Knees directly over the feet, bent at a right angle.   Lower back supported by your chair or a cushion in the curve of your lower back.   Shoulders and arms relaxed and at your sides.   Neck relaxed and not bent forwards or backwards.   Your desk and computer workstation  properly adjusted to your body type.   Your chair adjusted so there is no excess pressure on the back of your thighs.   The keyboard resting above your thighs. You should be able to reach the keys with your elbows at your side, bent at a right angle. Your arms should be supported on forearm rests, with your forearms parallel to the ground.   The computer mouse within easy reach.   The monitor directly in front of you, so that your eyes are aligned with the top of the screen. The screen should be about 15 to 25 inches from your eyes.   While typing, keep your wrist straight, in a neutral position. Move your entire arm when you move your mouse or when typing hard-to-reach keys.   Only use your computer as much as you need to for work. Do not use it during breaks.   Take breaks often from any repeated activity. Alternate with another task which requires you to use different muscles, or rest at least once every hour.   Change positions regularly. If you spend a lot of time sitting, get up, walk around, and stretch.   Do not hold pens or pencils tightly when writing.   Exercise regularly.   Maintain a normal weight.   Eat a diet with plenty of vegetables, whole grains, and fruit.   Get sufficient, restful sleep.  HOME CARE INSTRUCTIONS   If your caregiver prescribed medicine to help reduce swelling, take it as directed.   Only take over-the-counter or prescription medicines for pain, discomfort, or fever as directed by your caregiver.   Reduce, and if needed, stopthe activities that are causing your problems until you have no further symptoms.If your symptoms are work-related, you may need to talk to your supervisor about changing your activities.   When symptoms develop, put ice or a cold pack on the aching area.   Put ice in a plastic bag.   Place a towel between your skin and the bag.   Leave the ice on for 15-20 minutes.   If you were given a splint to keep your wrist from bending, wear it as  instructed. It is important to wear the splint at night. Use the splint for as long as your caregiver recommends.  SEEK MEDICAL CARE IF:   You develop new problems.   Your problems do not get better with medicine.  MAKE SURE YOU:   Understand these instructions.   Will watch your condition.   Will get help right away if you are not doing well or get worse.  Document Released: 10/11/2002 Document Revised: 04/21/2012 Document Reviewed: 12/12/2011  [employer] Patient Information 2014 [employer].

## 2014-02-21 NOTE — ED Notes (Signed)
Pt reports history many years ago of fx to right forearm, states he hit his right elbow earlier Sunday at work and has been having shooting pain through arm.

## 2014-02-21 NOTE — ED Provider Notes (Signed)
CSN: [id number]     Arrival date & time 02/21/14  0104 History   First MD Initiated Contact with Patient 02/21/14 0139     Chief Complaint  Patient presents with  . Arm Injury     (Consider location/radiation/quality/duration/timing/severity/associated sxs/prior Treatment) HPI History provided by patient. Right elbow pain, sharp and shooting radiates to right forearm. He works at [employer] and worked 16 hours today. At one point he had bumped his elbow he complained of severe pain localized to the area of the lateral condyle. Pain worse with movement of the hand, pronation and supination. He has previous history of forearm fracture and is worried that her biggest elbow tonight may have reaggravated something.  No weakness or numbness. He does do lifting and repetitive motions, cooking pizza and folding boxes. No history of same. No other pain injury trauma. No rash.  History reviewed. No pertinent past medical history. Past Surgical History  Procedure Laterality Date  . Appendectomy    . Hernia repair     Family History  Problem Relation Age of Onset  . Diabetes Mother   . Diabetes Father   . Heart attack Father   . Diabetes Maternal Grandfather   . Heart attack Paternal Grandfather    History  Substance Use Topics  . Smoking status: Current Every Day Smoker -- 0.50 packs/day    Types: Cigarettes    Start date: 11/04/1988  . Smokeless tobacco: Former Systems developer    Quit date: 11/13/2012  . Alcohol Use: No    Review of Systems  All other systems reviewed and are negative.     Allergies  Dimetapp; Penicillins; and Promethazine hcl  Home Medications   Prior to Admission medications   Medication Sig Start Date End Date Taking? Authorizing Provider  ibuprofen (ADVIL,MOTRIN) 800 MG tablet Take 1 tablet (800 mg total) by mouth 3 (three) times daily. 02/21/14   Teressa Lower, MD  pantoprazole (PROTONIX) 40 MG tablet Take 1 tablet (40 mg total) by mouth daily. 02/16/13 02/16/14   Mikey Kirschner, MD   BP 136/90  Pulse 89  Temp(Src) 97.9 F (36.6 C) (Oral)  Resp 20  Ht 5\' 9"  (1.753 m)  Wt 185 lb (83.915 kg)  BMI 27.31 kg/m2  SpO2 97% Physical Exam  Constitutional: He is oriented to person, place, and time. He appears well-developed and well-nourished.  HENT:  Head: Normocephalic and atraumatic.  Eyes: EOM are normal. Pupils are equal, round, and reactive to light.  Neck: Neck supple.  Cardiovascular: Regular rhythm and intact distal pulses.   Pulmonary/Chest: Effort normal. No respiratory distress.  Musculoskeletal:  Right upper extremity: Tenderness over the area the lateral condyle. No significant areas of tenderness otherwise. Good range of motion at the elbow. Reproducible symptoms with pronation, supination flexion and extension of the wrist. No tenderness or deformity the wrist. No swelling, deformity, erythema or increased warmth to touch at the elbow. Distal pulses and motor sensory and distally intact.  Neurological: He is alert and oriented to person, place, and time.  Skin: Skin is warm and dry.    ED Course  Procedures (including critical care time) Labs Review Labs Reviewed - No data to display  Imaging Review No results found.  Discussed with patient and x-rays not indicated and he agrees. Motrin and sling provided. No elbow brace available at this facility. Patient requesting referral to orthopedics Dr. Aline Brochure which was given. He agrees to all discharge and followup instructions. Work note provided for no heavy lifting.  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   MDM   Final diagnoses:  Lateral epicondylitis of right elbow   no deformity or significant mechanism to suggest indication for imaging. Patient is working extensive long hours at [employer] - he has tenderness over the lateral aspect of his elbow. No neurovascular deficits. Medication and sling provided.  Vital signs  and nursing notes reviewed and considered   Teressa Lower, MD 02/21/14 [phone]

## 2014-11-11 ENCOUNTER — Emergency Department (HOSPITAL_COMMUNITY)
Admission: EM | Admit: 2014-11-11 | Discharge: 2014-11-11 | Disposition: A | Discharge status: Home / Self Care | Payer: Commercial Indemnity | Attending: Emergency Medicine | Admitting: Emergency Medicine

## 2014-11-11 ENCOUNTER — Encounter (HOSPITAL_COMMUNITY): Payer: Self-pay

## 2014-11-11 DIAGNOSIS — Z720 Tobacco use: Secondary | ICD-10-CM | POA: Insufficient documentation

## 2014-11-11 DIAGNOSIS — M5432 Sciatica, left side: Secondary | ICD-10-CM | POA: Diagnosis not present

## 2014-11-11 DIAGNOSIS — Z791 Long term (current) use of non-steroidal anti-inflammatories (NSAID): Secondary | ICD-10-CM | POA: Insufficient documentation

## 2014-11-11 DIAGNOSIS — Z880 Allergy status to penicillin: Secondary | ICD-10-CM | POA: Insufficient documentation

## 2014-11-11 DIAGNOSIS — Z79899 Other long term (current) drug therapy: Secondary | ICD-10-CM | POA: Diagnosis not present

## 2014-11-11 DIAGNOSIS — M545 Low back pain: Secondary | ICD-10-CM | POA: Diagnosis present

## 2014-11-11 DIAGNOSIS — Z7952 Long term (current) use of systemic steroids: Secondary | ICD-10-CM | POA: Insufficient documentation

## 2014-11-11 MED ORDER — PREDNISONE 10 MG PO TABS: ORAL_TABLET | ORAL | Status: DC

## 2014-11-11 MED ORDER — CYCLOBENZAPRINE HCL 5 MG PO TABS: 5.0000 mg | ORAL_TABLET | Freq: Three times a day (TID) | ORAL | Status: DC | PRN

## 2014-11-11 MED ORDER — OXYCODONE-ACETAMINOPHEN 5-325 MG PO TABS: 1.0000 | ORAL_TABLET | ORAL | Status: DC | PRN

## 2014-11-11 MED ORDER — PREDNISONE 50 MG PO TABS
60.0000 mg | ORAL_TABLET | Freq: Once | ORAL | Status: AC
  Administered 2014-11-11: 60 mg via ORAL
  Filled 2014-11-11 (×2): qty 1

## 2014-11-11 MED ORDER — OXYCODONE-ACETAMINOPHEN 5-325 MG PO TABS
1.0000 | ORAL_TABLET | Freq: Once | ORAL | Status: AC
  Administered 2014-11-11: 1 via ORAL
  Filled 2014-11-11: qty 1

## 2014-11-11 NOTE — ED Notes (Signed)
Patient with continued pain at this time. Respirations even and unlabored. Skin warm/dry. Discharge instructions reviewed with patient at this time. Patient given opportunity to voice concerns/ask questions.  Patient discharged at this time and left Emergency Department with steady gait.

## 2014-11-11 NOTE — ED Notes (Signed)
Lumbar pain w/burning radiating pain bilaterally down legs to bottoms of feet.  No known injury, woke w/ pain 3 days ago.  States he sleeps with lower half of body in one direction and torso in another. Has tried OTC "Sciatic pain relief" and wife's TENS unit.  States both have given marginal relief.

## 2014-11-11 NOTE — ED Provider Notes (Signed)
CSN: [id number]     Arrival date & time 11/11/14  1902 History   First MD Initiated Contact with Patient 11/11/14 1917     Chief Complaint  Patient presents with  . Back Pain     (Consider location/radiation/quality/duration/timing/severity/associated sxs/prior Treatment) The history is provided by the patient and the spouse.   Andrew Everts Burkholder Sr. is a 39 y.o. male  Presenting with new onset of low back pain with radiation into his left leg, currently to the knee, but has been into his foot since this pain started 3 days ago.  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  He denies weakness in his legs but has a burning pain as described.  He denies urinary or fecal retention or incontinence.  He has taken an otc herbal sciatica pain reliever along with using heat and an otc TENS unit which provides temporary improvement.  He has no significant past medical history including no cancer history.  No IVDU.    History reviewed. No pertinent past medical history. Past Surgical History  Procedure Laterality Date  . Appendectomy    . Hernia repair     Family History  Problem Relation Age of Onset  . Diabetes Mother   . Diabetes Father   . Heart attack Father   . Diabetes Maternal Grandfather   . Heart attack Paternal Grandfather    History  Substance Use Topics  . Smoking status: Current Every Day Smoker -- 1.00 packs/day    Types: Cigarettes    Start date: 11/04/1988  . Smokeless tobacco: Former Systems developer    Quit date: 11/13/2012  . Alcohol Use: No    Review of Systems  Constitutional: Negative for fever.  Respiratory: Negative for shortness of breath.   Cardiovascular: Negative for chest pain and leg swelling.  Gastrointestinal: Negative for abdominal pain, constipation and abdominal distention.  Genitourinary: Negative for dysuria, urgency, frequency, flank pain and difficulty  urinating.  Musculoskeletal: Positive for back pain. Negative for joint swelling and gait problem.  Skin: Negative for rash.  Neurological: Negative for weakness and numbness.      Allergies  Dimetapp; Penicillins; and Promethazine hcl  Home Medications   Prior to Admission medications   Medication Sig Start Date End Date Taking? Authorizing Provider  cyclobenzaprine (FLEXERIL) 5 MG tablet Take 1 tablet (5 mg total) by mouth 3 (three) times daily as needed. 11/11/14   Evalee Jefferson, PA-C  ibuprofen (ADVIL,MOTRIN) 800 MG tablet Take 1 tablet (800 mg total) by mouth 3 (three) times daily. 02/21/14   Teressa Lower, MD  oxyCODONE-acetaminophen (PERCOCET/ROXICET) 5-325 MG per tablet Take 1 tablet by mouth every 4 (four) hours as needed. 11/11/14   Evalee Jefferson, PA-C  pantoprazole (PROTONIX) 40 MG tablet Take 1 tablet (40 mg total) by mouth daily. 02/16/13 02/16/14  Mikey Kirschner, MD  predniSONE (DELTASONE) 10 MG tablet 6, 5, 4, 3, 2 then 1 tablet by mouth daily for 6 days total. 11/11/14   Evalee Jefferson, PA-C   BP 123/83 mmHg  Pulse 100  Temp(Src) 98 F (36.7 C) (Oral)  Resp 18  Ht 5\' 10"  (1.778 m)  Wt 190 lb (86.183 kg)  BMI 27.26 kg/m2  SpO2 100% Physical Exam  Constitutional: He appears well-developed and well-nourished.  HENT:  Head: Normocephalic.  Eyes: Conjunctivae are normal.  Neck: Normal range of motion. Neck supple.  Cardiovascular: Normal rate and  intact distal pulses.   Pedal pulses normal.  Pulmonary/Chest: Effort normal.  Abdominal: Soft. Bowel sounds are normal. He exhibits no distension and no mass.  Musculoskeletal: Normal range of motion. He exhibits no edema.       Lumbar back: He exhibits tenderness. He exhibits no swelling, no edema and no spasm.  Neurological: He is alert. He has normal strength. He displays no atrophy and no tremor. No sensory deficit. Gait normal.  Reflex Scores:      Patellar reflexes are 2+ on the right side and 2+ on the left side.      Achilles  reflexes are 2+ on the right side and 2+ on the left side. No strength deficit noted in hip and knee flexor and extensor muscle groups.  Ankle flexion and extension intact.  Skin: Skin is warm and dry.  Psychiatric: He has a normal mood and affect.  Nursing note and vitals reviewed.   ED Course  Procedures (including critical care time) Labs Review Labs Reviewed - No data to display  Imaging Review No results found.   EKG Interpretation None      MDM   Final diagnoses:  Sciatica, left    No neuro deficit on exam or by history to suggest emergent or surgical presentation.  Also discussed worsened sx that should prompt immediate re-evaluation including distal weakness, bowel/bladder retention/incontinence.  Pt was prescribed prednisone taper, flexeril, oxycodone.  Heat, activity as tolerated.  F/u with pcp if not improving over the next 5 days with tx.        Evalee Jefferson, PA-C 11/12/14 [id number]  Virgel Manifold, MD 11/12/14 [phone]

## 2014-11-11 NOTE — ED Notes (Signed)
Patient c/o of mid lower back pain and "burning" which has progressively worsened over the last 3 days

## 2014-11-11 NOTE — Discharge Instructions (Signed)
Sciatica °Sciatica is pain, weakness, numbness, or tingling along the path of the sciatic nerve. The nerve starts in the lower back and runs down the back of each leg. The nerve controls the muscles in the lower leg and in the back of the knee, while also providing sensation to the back of the thigh, lower leg, and the sole of your foot. Sciatica is a symptom of another medical condition. For instance, nerve damage or certain conditions, such as a herniated disk or bone spur on the spine, pinch or put pressure on the sciatic nerve. This causes the pain, weakness, or other sensations normally associated with sciatica. Generally, sciatica only affects one side of the body. °CAUSES  °· Herniated or slipped disc. °· Degenerative disk disease. °· A pain disorder involving the narrow muscle in the buttocks (piriformis syndrome). °· Pelvic injury or fracture. °· Pregnancy. °· Tumor (rare). °SYMPTOMS  °Symptoms can vary from mild to very severe. The symptoms usually travel from the low back to the buttocks and down the back of the leg. Symptoms can include: °· Mild tingling or dull aches in the lower back, leg, or hip. °· Numbness in the back of the calf or sole of the foot. °· Burning sensations in the lower back, leg, or hip. °· Sharp pains in the lower back, leg, or hip. °· Leg weakness. °· Severe back pain inhibiting movement. °These symptoms may get worse with coughing, sneezing, laughing, or prolonged sitting or standing. Also, being overweight may worsen symptoms. °DIAGNOSIS  °Your caregiver will perform a physical exam to look for common symptoms of sciatica. He or she may ask you to do certain movements or activities that would trigger sciatic nerve pain. Other tests may be performed to find the cause of the sciatica. These may include: °· Blood tests. °· X-rays. °· Imaging tests, such as an MRI or CT scan. °TREATMENT  °Treatment is directed at the cause of the sciatic pain. Sometimes, treatment is not necessary  and the pain and discomfort goes away on its own. If treatment is needed, your caregiver may suggest: °· Over-the-counter medicines to relieve pain. °· Prescription medicines, such as anti-inflammatory medicine, muscle relaxants, or narcotics. °· Applying heat or ice to the painful area. °· Steroid injections to lessen pain, irritation, and inflammation around the nerve. °· Reducing activity during periods of pain. °· Exercising and stretching to strengthen your abdomen and improve flexibility of your spine. Your caregiver may suggest losing weight if the extra weight makes the back pain worse. °· Physical therapy. °· Surgery to eliminate what is pressing or pinching the nerve, such as a bone spur or part of a herniated disk. °HOME CARE INSTRUCTIONS  °· Only take over-the-counter or prescription medicines for pain or discomfort as directed by your caregiver. °· Apply ice to the affected area for 20 minutes, 3-4 times a day for the first 48-72 hours. Then try heat in the same way. °· Exercise, stretch, or perform your usual activities if these do not aggravate your pain. °· Attend physical therapy sessions as directed by your caregiver. °· Keep all follow-up appointments as directed by your caregiver. °· Do not wear high heels or shoes that do not provide proper support. °· Check your mattress to see if it is too soft. A firm mattress may lessen your pain and discomfort. °SEEK IMMEDIATE MEDICAL CARE IF:  °· You lose control of your bowel or bladder (incontinence). °· You have increasing weakness in the lower back, pelvis, buttocks,   or legs.  You have redness or swelling of your back.  You have a burning sensation when you urinate.  You have pain that gets worse when you lie down or awakens you at night.  Your pain is worse than you have experienced in the past.  Your pain is lasting longer than 4 weeks.  You are suddenly losing weight without reason. MAKE SURE YOU:  Understand these  instructions.  Will watch your condition.  Will get help right away if you are not doing well or get worse. Document Released: 10/15/2001 Document Revised: 04/21/2012 Document Reviewed: 03/01/2012 [facility] Patient Information 2015 [place], [place]. This information is not intended to replace advice given to you by your health care provider. Make sure you discuss any questions you have with your health care provider.   Take your next dose of prednisone tomorrow evening.  Use the the other medicines as directed.  Do not drive within 4 hours of taking oxycodone as this will make you drowsy.  Avoid lifting,  Bending,  Twisting or any other activity that worsens your pain over the next week.  Apply a heating pad to your low back for 20 minutes every 4-6 hours.  You should get rechecked if your symptoms are not better over the next week,  Or you develop increased pain,  Weakness in your leg(s) or loss of bladder or bowel function - these are symptoms of a worse injury.

## 2015-03-02 ENCOUNTER — Encounter (HOSPITAL_COMMUNITY): Payer: Self-pay

## 2015-03-02 ENCOUNTER — Emergency Department (HOSPITAL_COMMUNITY)
Admission: EM | Admit: 2015-03-02 | Discharge: 2015-03-03 | Disposition: A | Discharge status: Home / Self Care | Payer: Commercial Indemnity | Attending: Emergency Medicine | Admitting: Emergency Medicine

## 2015-03-02 DIAGNOSIS — R519 Headache, unspecified: Secondary | ICD-10-CM

## 2015-03-02 DIAGNOSIS — Z720 Tobacco use: Secondary | ICD-10-CM | POA: Diagnosis not present

## 2015-03-02 DIAGNOSIS — R110 Nausea: Secondary | ICD-10-CM | POA: Insufficient documentation

## 2015-03-02 DIAGNOSIS — Z79899 Other long term (current) drug therapy: Secondary | ICD-10-CM | POA: Insufficient documentation

## 2015-03-02 DIAGNOSIS — Z880 Allergy status to penicillin: Secondary | ICD-10-CM | POA: Insufficient documentation

## 2015-03-02 DIAGNOSIS — R51 Headache: Secondary | ICD-10-CM | POA: Insufficient documentation

## 2015-03-02 DIAGNOSIS — Z7952 Long term (current) use of systemic steroids: Secondary | ICD-10-CM | POA: Insufficient documentation

## 2015-03-02 MED ORDER — SODIUM CHLORIDE 0.9 % IV SOLN
1000.0000 mL | Freq: Once | INTRAVENOUS | Status: AC
  Administered 2015-03-03: 1000 mL via INTRAVENOUS

## 2015-03-02 MED ORDER — SODIUM CHLORIDE 0.9 % IV SOLN: 1000.0000 mL | INTRAVENOUS | Status: DC

## 2015-03-02 MED ORDER — DIPHENHYDRAMINE HCL 50 MG/ML IJ SOLN
25.0000 mg | Freq: Once | INTRAMUSCULAR | Status: AC
  Administered 2015-03-03: 25 mg via INTRAVENOUS
  Filled 2015-03-02: qty 1

## 2015-03-02 MED ORDER — METOCLOPRAMIDE HCL 5 MG/ML IJ SOLN
10.0000 mg | Freq: Once | INTRAMUSCULAR | Status: AC
  Administered 2015-03-03: 10 mg via INTRAVENOUS
  Filled 2015-03-02: qty 2

## 2015-03-02 NOTE — ED Notes (Signed)
Pt c/o headache  X 9 days.  Pt took some imitrex through the day without relief.

## 2015-03-02 NOTE — ED Provider Notes (Signed)
CSN: [id number]     Arrival date & time 03/02/15  2337 History  This chart was scribed for Delora Fuel, MD by Eustaquio Maize, ED Scribe. This patient was seen in room APA02/APA02 and the patient's care was started at 11:53 PM.    Chief Complaint  Patient presents with  . Headache   The history is provided by the patient. No language interpreter was used.     HPI Comments: Andrew Leinberger Sr. is a 39 y.o. male who presents to the Emergency Department complaining of constant occiput headache that began 9 days ago. He describes it as a sharp, throbbing sensation and rates it as a 9/10 on the pain scale. The pain is exacerbated with sudden movements. Pt reports applying ice pack to the head which mildly relieved his symptoms but the headache returned upon sitting up. Pt also complains of nausea. He denies blurry vision, vomiting, weakness, numbness, tingling, or any other symptoms. Pt has never had headaches like this in the past. He saw PCP, Dr. Wolfgang Phoenix, 1 day ago, and was prescribed Imitrex. Pt took 2 Imitrex today without relief.    History reviewed. No pertinent past medical history. Past Surgical History  Procedure Laterality Date  . Appendectomy    . Hernia repair     Family History  Problem Relation Age of Onset  . Diabetes Mother   . Diabetes Father   . Heart attack Father   . Diabetes Maternal Grandfather   . Heart attack Paternal Grandfather    History  Substance Use Topics  . Smoking status: Current Every Day Smoker -- 1.00 packs/day    Types: Cigarettes    Start date: 11/04/1988  . Smokeless tobacco: Former Systems developer    Quit date: 11/13/2012  . Alcohol Use: No    Review of Systems  Eyes: Negative for visual disturbance.  Gastrointestinal: Positive for nausea. Negative for vomiting.  Neurological: Positive for headaches. Negative for weakness and numbness.  All other systems reviewed and are negative.     Allergies  Dimetapp; Penicillins; and Promethazine hcl  Home  Medications   Prior to Admission medications   Medication Sig Start Date End Date Taking? Authorizing Provider  SUMAtriptan (IMITREX) 50 MG tablet Take 50 mg by mouth every 2 (two) hours as needed for migraine. May repeat in 2 hours if headache persists or recurs.   Yes Historical Provider, MD  cyclobenzaprine (FLEXERIL) 5 MG tablet Take 1 tablet (5 mg total) by mouth 3 (three) times daily as needed. 11/11/14   Evalee Jefferson, PA-C  ibuprofen (ADVIL,MOTRIN) 800 MG tablet Take 1 tablet (800 mg total) by mouth 3 (three) times daily. 02/21/14   Teressa Lower, MD  oxyCODONE-acetaminophen (PERCOCET/ROXICET) 5-325 MG per tablet Take 1 tablet by mouth every 4 (four) hours as needed. 11/11/14   Evalee Jefferson, PA-C  pantoprazole (PROTONIX) 40 MG tablet Take 1 tablet (40 mg total) by mouth daily. 02/16/13 02/16/14  Mikey Kirschner, MD  predniSONE (DELTASONE) 10 MG tablet 6, 5, 4, 3, 2 then 1 tablet by mouth daily for 6 days total. 11/11/14   Evalee Jefferson, PA-C   Triage Vitals: BP 109/80 mmHg  Pulse 91  Temp(Src) 97.9 F (36.6 C) (Oral)  Resp 16  Ht 5\' 10"  (1.778 m)  Wt 180 lb (81.647 kg)  BMI 25.83 kg/m2  SpO2 96%   Physical Exam  Constitutional: He is oriented to person, place, and time. He appears well-developed and well-nourished. No distress.  HENT:  Head: Normocephalic and atraumatic.  Eyes: Conjunctivae and EOM are normal. Pupils are equal, round, and reactive to light.  Fundi normal.   Neck: Normal range of motion. Neck supple. No JVD present.  Moderate tenderness right paracervical muscle.   Cardiovascular: Normal rate, regular rhythm and normal heart sounds.   No murmur heard. Pulmonary/Chest: Effort normal and breath sounds normal. He has no wheezes. He has no rales. He exhibits no tenderness.  Abdominal: Soft. Bowel sounds are normal. He exhibits no distension and no mass. There is no tenderness.  Musculoskeletal: Normal range of motion. He exhibits no edema.  Lymphadenopathy:    He has no cervical  adenopathy.  Neurological: He is alert and oriented to person, place, and time. No cranial nerve deficit. He exhibits normal muscle tone. Coordination normal.  Skin: Skin is warm and dry. No rash noted.  Psychiatric: He has a normal mood and affect. His behavior is normal. Judgment and thought content normal.  Nursing note and vitals reviewed.   ED Course  Procedures (including critical care time)  DIAGNOSTIC STUDIES: Oxygen Saturation is 96% on RA, normal by my interpretation.    COORDINATION OF CARE: 11:58 PM-Discussed treatment plan which includes CT Head with pt at bedside and pt agreed to plan.   Imaging Review Ct Head Wo Contrast  03/03/2015   CLINICAL DATA:  39 year old male with 9 day history of occipital headache  EXAM: CT HEAD WITHOUT CONTRAST  TECHNIQUE: Contiguous axial images were obtained from the base of the skull through the vertex without intravenous contrast.  COMPARISON:  Prior CT scan of the head 11/13/2006  FINDINGS: Negative for acute intracranial hemorrhage, acute infarction, mass, mass effect, hydrocephalus or midline shift. Gray-white differentiation is preserved throughout. No acute soft tissue or calvarial abnormality. The globes and orbits are symmetric and unremarkable. Normal aeration of the mastoid air cells and visualized paranasal sinuses.  IMPRESSION: Negative head CT.   Electronically Signed   By: Jacqulynn Cadet M.D.   On: 03/03/2015 01:18   Images viewed by me.  MDM   Final diagnoses:  Headache, unspecified headache type    Headache suspicious for muscle contraction headache. He does have tenderness over his right paracervical muscles. Because he does not usually get headaches, he will be sent for CT scan. Headache cocktail given consisting of IV fluids, metoclopramide, and diphenhydramine.  Following above-noted treatment, headache is substantially improved. CT is unremarkable. He is discharged with prescription for metoclopramide. He is given a  dose of dexamethasone prior to discharge.   I personally performed the services described in this documentation, which was scribed in my presence. The recorded information has been reviewed and is accurate.       Delora Fuel, MD [id number]

## 2015-03-03 ENCOUNTER — Emergency Department (HOSPITAL_COMMUNITY): Payer: Commercial Indemnity

## 2015-03-03 MED ORDER — DEXAMETHASONE SODIUM PHOSPHATE 10 MG/ML IJ SOLN
10.0000 mg | Freq: Once | INTRAMUSCULAR | Status: AC
  Administered 2015-03-03: 10 mg via INTRAVENOUS
  Filled 2015-03-03: qty 1

## 2015-03-03 MED ORDER — METOCLOPRAMIDE HCL 10 MG PO TABS: 10.0000 mg | ORAL_TABLET | Freq: Four times a day (QID) | ORAL | Status: DC | PRN

## 2015-03-03 NOTE — Discharge Instructions (Signed)
Tension Headache A tension headache is a feeling of pain, pressure, or aching often felt over the front and sides of the head. The pain can be dull or can feel tight (constricting). It is the most common type of headache. Tension headaches are not normally associated with nausea or vomiting and do not get worse with physical activity. Tension headaches can last 30 minutes to several days.  CAUSES  The exact cause is not known, but it may be caused by chemicals and hormones in the brain that lead to pain. Tension headaches often begin after stress, anxiety, or depression. Other triggers may include:  Alcohol.  Caffeine (too much or withdrawal).  Respiratory infections (colds, flu, sinus infections).  Dental problems or teeth clenching.  Fatigue.  Holding your head and neck in one position too long while using a computer. SYMPTOMS   Pressure around the head.   Dull, aching head pain.   Pain felt over the front and sides of the head.   Tenderness in the muscles of the head, neck, and shoulders. DIAGNOSIS  A tension headache is often diagnosed based on:   Symptoms.   Physical examination.   A CT scan or MRI of your head. These tests may be ordered if symptoms are severe or unusual. TREATMENT  Medicines may be given to help relieve symptoms.  HOME CARE INSTRUCTIONS   Only take over-the-counter or prescription medicines for pain or discomfort as directed by your caregiver.   Lie down in a dark, quiet room when you have a headache.   Keep a journal to find out what may be triggering your headaches. For example, write down:  What you eat and drink.  How much sleep you get.  Any change to your diet or medicines.  Try massage or other relaxation techniques.   Ice packs or heat applied to the head and neck can be used. Use these 3 to 4 times per day for 15 to 20 minutes each time, or as needed.   Limit stress.   Sit up straight, and do not tense your muscles.    Quit smoking if you smoke.  Limit alcohol use.  Decrease the amount of caffeine you drink, or stop drinking caffeine.  Eat and exercise regularly.  Get 7 to 9 hours of sleep, or as recommended by your caregiver.  Avoid excessive use of pain medicine as recurrent headaches can occur.  SEEK MEDICAL CARE IF:   You have problems with the medicines you were prescribed.  Your medicines do not work.  You have a change from the usual headache.  You have nausea or vomiting. SEEK IMMEDIATE MEDICAL CARE IF:   Your headache becomes severe.  You have a fever.  You have a stiff neck.  You have loss of vision.  You have muscular weakness or loss of muscle control.  You lose your balance or have trouble walking.  You feel faint or pass out.  You have severe symptoms that are different from your first symptoms. MAKE SURE YOU:   Understand these instructions.  Will watch your condition.  Will get help right away if you are not doing well or get worse. Document Released: 10/21/2005 Document Revised: 01/13/2012 Document Reviewed: 10/11/2011 [facility] Patient Information 2015 [place], [place]. This information is not intended to replace advice given to you by your health care provider. Make sure you discuss any questions you have with your health care provider.  Metoclopramide tablets What is this medicine? METOCLOPRAMIDE (met oh kloe PRA mide)  is used to treat the symptoms of gastroesophageal reflux disease (GERD) like heartburn. It is also used to treat people with slow emptying of the stomach and intestinal tract. This medicine may be used for other purposes; ask your health care provider or pharmacist if you have questions. COMMON BRAND NAME(S): Reglan What should I tell my health care provider before I take this medicine? They need to know if you have any of these conditions: -breast cancer -depression -diabetes -heart failure -high blood pressure -kidney  disease -liver disease -Parkinson's disease or a movement disorder -pheochromocytoma -seizures -stomach obstruction, bleeding, or perforation -an unusual or allergic reaction to metoclopramide, procainamide, sulfites, other medicines, foods, dyes, or preservatives -pregnant or trying to get pregnant -breast-feeding How should I use this medicine? Take this medicine by mouth with a glass of water. Follow the directions on the prescription label. Take this medicine on an empty stomach, about 30 minutes before eating. Take your doses at regular intervals. Do not take your medicine more often than directed. Do not stop taking except on the advice of your doctor or health care professional. A special MedGuide will be given to you by the pharmacist with each prescription and refill. Be sure to read this information carefully each time. Talk to your pediatrician regarding the use of this medicine in children. Special care may be needed. Overdosage: If you think you have taken too much of this medicine contact a poison control center or emergency room at once. NOTE: This medicine is only for you. Do not share this medicine with others. What if I miss a dose? If you miss a dose, take it as soon as you can. If it is almost time for your next dose, take only that dose. Do not take double or extra doses. What may interact with this medicine? -acetaminophen -cyclosporine -digoxin -medicines for blood pressure -medicines for diabetes, including insulin -medicines for hay fever and other allergies -medicines for depression, especially an Monoamine Oxidase Inhibitor (MAOI) -medicines for Parkinson's disease, like levodopa -medicines for sleep or for pain -tetracycline This list may not describe all possible interactions. Give your health care provider a list of all the medicines, herbs, non-prescription drugs, or dietary supplements you use. Also tell them if you smoke, drink alcohol, or use illegal  drugs. Some items may interact with your medicine. What should I watch for while using this medicine? It may take a few weeks for your stomach condition to start to get better. However, do not take this medicine for longer than 12 weeks. The longer you take this medicine, and the more you take it, the greater your chances are of developing serious side effects. If you are an elderly patient, a male patient, or you have diabetes, you may be at an increased risk for side effects from this medicine. Contact your doctor immediately if you start having movements you cannot control such as lip smacking, rapid movements of the tongue, involuntary or uncontrollable movements of the eyes, head, arms and legs, or muscle twitches and spasms. Patients and their families should watch out for worsening depression or thoughts of suicide. Also watch out for any sudden or severe changes in feelings such as feeling anxious, agitated, panicky, irritable, hostile, aggressive, impulsive, severely restless, overly excited and hyperactive, or not being able to sleep. If this happens, especially at the beginning of treatment or after a change in dose, call your doctor. Do not treat yourself for high fever. Ask your doctor or health care professional for  advice. You may get drowsy or dizzy. Do not drive, use machinery, or do anything that needs mental alertness until you know how this drug affects you. Do not stand or sit up quickly, especially if you are an older patient. This reduces the risk of dizzy or fainting spells. Alcohol can make you more drowsy and dizzy. Avoid alcoholic drinks. What side effects may I notice from receiving this medicine? Side effects that you should report to your doctor or health care professional as soon as possible: -allergic reactions like skin rash, itching or hives, swelling of the face, lips, or tongue -abnormal production of milk in females -breast enlargement in both males and  females -change in the way you walk -difficulty moving, speaking or swallowing -drooling, lip smacking, or rapid movements of the tongue -excessive sweating -fever -involuntary or uncontrollable movements of the eyes, head, arms and legs -irregular heartbeat or palpitations -muscle twitches and spasms -unusually weak or tired Side effects that usually do not require medical attention (report to your doctor or health care professional if they continue or are bothersome): -change in sex drive or performance -depressed mood -diarrhea -difficulty sleeping -headache -menstrual changes -restless or nervous This list may not describe all possible side effects. Call your doctor for medical advice about side effects. You may report side effects to FDA at 1-800-FDA-1088. Where should I keep my medicine? Keep out of the reach of children. Store at room temperature between 20 and 25 degrees C (68 and 77 degrees F). Protect from light. Keep container tightly closed. Throw away any unused medicine after the expiration date. NOTE: This sheet is a summary. It may not cover all possible information. If you have questions about this medicine, talk to your doctor, pharmacist, or health care provider.  2015, [employer]. (2012-02-18 13:04:38)

## 2015-07-05 ENCOUNTER — Ambulatory Visit (INDEPENDENT_AMBULATORY_CARE_PROVIDER_SITE_OTHER): Payer: Commercial Indemnity | Admitting: Family Medicine

## 2015-07-05 ENCOUNTER — Encounter: Payer: Self-pay | Admitting: Family Medicine

## 2015-07-05 VITALS — BP 112/76 | Ht 68.0 in | Wt 188.0 lb

## 2015-07-05 DIAGNOSIS — F329 Major depressive disorder, single episode, unspecified: Secondary | ICD-10-CM | POA: Diagnosis not present

## 2015-07-05 DIAGNOSIS — Z13220 Encounter for screening for lipoid disorders: Secondary | ICD-10-CM | POA: Diagnosis not present

## 2015-07-05 DIAGNOSIS — R5382 Chronic fatigue, unspecified: Secondary | ICD-10-CM

## 2015-07-05 DIAGNOSIS — G4700 Insomnia, unspecified: Secondary | ICD-10-CM | POA: Diagnosis not present

## 2015-07-05 DIAGNOSIS — R5383 Other fatigue: Secondary | ICD-10-CM | POA: Insufficient documentation

## 2015-07-05 DIAGNOSIS — F32A Depression, unspecified: Secondary | ICD-10-CM

## 2015-07-05 DIAGNOSIS — F3181 Bipolar II disorder: Secondary | ICD-10-CM | POA: Insufficient documentation

## 2015-07-05 DIAGNOSIS — F331 Major depressive disorder, recurrent, moderate: Secondary | ICD-10-CM | POA: Insufficient documentation

## 2015-07-05 MED ORDER — BUPROPION HCL ER (SR) 150 MG PO TB12: 150.0000 mg | ORAL_TABLET | Freq: Two times a day (BID) | ORAL | Status: DC

## 2015-07-05 MED ORDER — CLONAZEPAM 1 MG PO TABS: 1.0000 mg | ORAL_TABLET | Freq: Every day | ORAL | Status: DC

## 2015-07-05 MED ORDER — RANITIDINE HCL 300 MG PO TABS: 300.0000 mg | ORAL_TABLET | Freq: Every day | ORAL | Status: DC

## 2015-07-05 NOTE — Progress Notes (Signed)
   Subjective:    Patient ID: Andrew Mode Sr., male    DOB: 04/06/1976, 39 y.o.   MRN: [id number]  HPI The patient comes in today for a wellness visit.    A review of their health history was completed.  A review of medications was also completed.  Any needed refills; no  Eating habits: not health conscious  Falls/  MVA accidents in past few months: none  Regular exercise: none  Specialist pt sees on regular basis: none  Preventative health issues were discussed.   Additional concerns: was taking protonix for reflux but stopped because of side effects. It caused diarrhea.   Anxiety and depression since father passed away 5 months ago.   pt having a lot of difficulty  nsomnia worse, feeeling down all the time  Having depr thought s at times  Feeling down  Worse ever felt in the past for this   Harder tpo concerntrate, but not himself at work  A beer on occasion.  Finds self ovrprotective with the kids  Appetite diminished,  Patient has had significant loose stools with protonic. Yet reflux has increased markedly.  Also noting fatigue and tiredness. Review of Systems No headache no chest pain no back pain no abdominal pain no change in bowel habits    Objective:   Physical Exam  Alert vital stable HEENT normal lungs clear. Heart regular in rhythm. Abdomen benign.      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 Appropriate blood work. To assess #4. Initiate anti-depressive rationale discussed initiate medicine for insomnia. Follow-up in one month for wellness exam. WSL

## 2015-07-15 ENCOUNTER — Emergency Department (HOSPITAL_COMMUNITY)
Admission: EM | Admit: 2015-07-15 | Discharge: 2015-07-16 | Disposition: A | Discharge status: Home / Self Care | Payer: Commercial Indemnity | Attending: Emergency Medicine | Admitting: Emergency Medicine

## 2015-07-15 ENCOUNTER — Emergency Department (HOSPITAL_COMMUNITY): Payer: Commercial Indemnity

## 2015-07-15 ENCOUNTER — Encounter (HOSPITAL_COMMUNITY): Payer: Self-pay | Admitting: Emergency Medicine

## 2015-07-15 DIAGNOSIS — Z720 Tobacco use: Secondary | ICD-10-CM | POA: Diagnosis not present

## 2015-07-15 DIAGNOSIS — Z79899 Other long term (current) drug therapy: Secondary | ICD-10-CM | POA: Diagnosis not present

## 2015-07-15 DIAGNOSIS — M79651 Pain in right thigh: Secondary | ICD-10-CM | POA: Insufficient documentation

## 2015-07-15 DIAGNOSIS — M25551 Pain in right hip: Secondary | ICD-10-CM | POA: Diagnosis present

## 2015-07-15 DIAGNOSIS — Z880 Allergy status to penicillin: Secondary | ICD-10-CM | POA: Diagnosis not present

## 2015-07-15 MED ORDER — CYCLOBENZAPRINE HCL 10 MG PO TABS
10.0000 mg | ORAL_TABLET | Freq: Two times a day (BID) | ORAL | Status: DC | PRN
Start: 2015-07-15 — End: 2016-04-23

## 2015-07-15 MED ORDER — NAPROXEN 500 MG PO TABS: 500.0000 mg | ORAL_TABLET | Freq: Two times a day (BID) | ORAL | Status: DC

## 2015-07-15 NOTE — ED Provider Notes (Signed)
CSN: [id number]     Arrival date & time 07/15/15  2048 History  This chart was scribed for Fredia Sorrow, MD by Hansel Feinstein, ED Scribe. This patient was seen in room APA08/APA08 and the patient's care was started at 10:08 PM.     Chief Complaint  Patient presents with  . Hip Pain   Patient is a 39 y.o. male presenting with hip pain. The history is provided by the patient. No language interpreter was used.  Hip Pain This is a new problem. The current episode started yesterday. Pertinent negatives include no chest pain, no abdominal pain, no headaches and no shortness of breath. Exacerbated by: sitting. The symptoms are relieved by position (standing). He has tried nothing for the symptoms.    HPI Comments: Andrew Pollan Sr. is a 39 y.o. male who presents to the Emergency Department complaining of moderate right hip 7/10 pain that radiates to the lateral thigh onset last night. Pt states that he thought that he pulled a muscle. He states that pain is worsened with sitting and relieved when standing. Pt denies recent trauma, injury, heavy lifting, falls, twisting.  Pt denies fever, congestion, rhinorrhea, sore throat, visual disturbance, cough, SOB, CP, leg swelling, abdominal pain, diarrhea, nausea, vomiting, dysuria, back pain, neck pain, rash, HA, bruising easily, hematuria, numbness or focal weakness to bilateral feet.    History reviewed. No pertinent past medical history. Past Surgical History  Procedure Laterality Date  . Appendectomy    . Hernia repair     Family History  Problem Relation Age of Onset  . Diabetes Mother   . Diabetes Father   . Heart attack Father   . Diabetes Maternal Grandfather   . Heart attack Paternal Grandfather    Social History  Substance Use Topics  . Smoking status: Current Every Day Smoker -- 1.00 packs/day    Types: Cigarettes    Start date: 11/04/1988  . Smokeless tobacco: Former Systems developer    Quit date: 11/13/2012  . Alcohol Use: No    Review of  Systems  Constitutional: Negative for fever.  HENT: Negative for congestion, rhinorrhea and sore throat.   Eyes: Negative for visual disturbance.  Respiratory: Negative for cough and shortness of breath.   Cardiovascular: Negative for chest pain and leg swelling.  Gastrointestinal: Negative for nausea, vomiting, abdominal pain and diarrhea.  Genitourinary: Negative for dysuria and hematuria.  Musculoskeletal: Positive for arthralgias. Negative for back pain, joint swelling and neck pain.  Skin: Negative for rash.  Neurological: Negative for weakness, numbness and headaches.  Hematological: Does not bruise/bleed easily.  Psychiatric/Behavioral: Negative for confusion.   Allergies  Dimetapp; Penicillins; and Promethazine hcl  Home Medications   Prior to Admission medications   Medication Sig Start Date End Date Taking? Authorizing Provider  buPROPion (WELLBUTRIN SR) 150 MG 12 hr tablet Take 1 tablet (150 mg total) by mouth 2 (two) times daily. 07/05/15  Yes Mikey Kirschner, MD  ranitidine (ZANTAC) 300 MG tablet Take 1 tablet (300 mg total) by mouth at bedtime. 07/05/15  Yes Mikey Kirschner, MD  clonazePAM (KLONOPIN) 1 MG tablet Take 1 tablet (1 mg total) by mouth at bedtime. Patient taking differently: Take 1 mg by mouth at bedtime as needed (Sleep).  07/05/15   Mikey Kirschner, MD  cyclobenzaprine (FLEXERIL) 10 MG tablet Take 1 tablet (10 mg total) by mouth 2 (two) times daily as needed for muscle spasms. 07/15/15   Fredia Sorrow, MD  metoCLOPramide (REGLAN) 10 MG tablet  Take 1 tablet (10 mg total) by mouth every 6 (six) hours as needed for nausea (or headache). 01/26/39   Delora Fuel, MD  naproxen (NAPROSYN) 500 MG tablet Take 1 tablet (500 mg total) by mouth 2 (two) times daily. 07/15/15   Fredia Sorrow, MD  SUMAtriptan (IMITREX) 50 MG tablet Take 50 mg by mouth every 2 (two) hours as needed for migraine. May repeat in 2 hours if headache persists or recurs.    Historical Provider, MD    BP 119/84 mmHg  Pulse 94  Temp(Src) 98.2 F (36.8 C) (Oral)  Resp 20  Ht 5\' 8"  (1.727 m)  Wt 188 lb (85.276 kg)  BMI 28.59 kg/m2  SpO2 100% Physical Exam  Constitutional: He is oriented to person, place, and time. He appears well-developed and well-nourished.  HENT:  Head: Normocephalic and atraumatic.  Mouth/Throat: Oropharynx is clear and moist.  Eyes: Conjunctivae and EOM are normal. Pupils are equal, round, and reactive to light. No scleral icterus.  Eyes tracking normally.   Neck: Normal range of motion. Neck supple.  Cardiovascular: Normal rate, regular rhythm and normal heart sounds.   No murmur heard. Pulses:      Dorsalis pedis pulses are 2+ on the right side.  Pulmonary/Chest: Effort normal and breath sounds normal. No respiratory distress.  Lungs CTA bilaterally.   Abdominal: Soft. Bowel sounds are normal. He exhibits no distension. There is no tenderness.  Musculoskeletal: Normal range of motion. He exhibits no edema.  No swelling to the right or left ankle. No swelling in the right knee. Kneecap is not dislocated. No obvious bruising or deformity to the lateral aspect of the right thigh. No tenderness to the lower back.   Neurological: He is alert and oriented to person, place, and time. No cranial nerve deficit. He exhibits normal muscle tone. Coordination normal.  Skin: Skin is warm and dry.  Psychiatric: He has a normal mood and affect. His behavior is normal.  Nursing note and vitals reviewed.   ED Course  Procedures (including critical care time) DIAGNOSTIC STUDIES: Oxygen Saturation is 100% on RA, normal by my interpretation.    COORDINATION OF CARE: 10:14 PM Discussed treatment plan with pt at bedside and pt agreed to plan.   Labs Review Labs Reviewed - No data to display  Imaging Review No results found. I have personally reviewed and evaluated these images and lab results as part of my medical decision-making.   EKG Interpretation None       MDM   Final diagnoses:  Pain of right thigh   Patient symptoms suggestive of muscular skeletal lateral right thigh pain. X-rays ordered to confirm no bony abnormalities. If no bony abnormalities. Patient will be treated with Naprosyn and Flexeril work note to rest the leg and follow-up if not improving in a few days.  No clinical evidence suggestive of a deep vein thrombosis.  Patient without any history of acute injury. Suspect it may just be a strain.   I personally performed the services described in this documentation, which was scribed in my presence. The recorded information has been reviewed and is accurate.    Fredia Sorrow, MD 07/15/15 [id number]

## 2015-07-15 NOTE — Discharge Instructions (Signed)
X-rays without any significant findings. Take the Naprosyn on a regular basis take the Flexeril as a muscle relaxer. Work note provided to be out of work for the next 3 days. Rest the leg is much as possible. Return for any new or worse symptoms or if not improved in a few days.

## 2015-07-15 NOTE — ED Notes (Signed)
Pain to right hip. States he does not know "how it happened" thought it was a " pulled muscle" but has gotten worse. Has taken tylenol, 1000mg  at 1pm.

## 2015-08-04 LAB — BASIC METABOLIC PANEL
BUN/Creatinine Ratio: 11 (ref 8–19)
BUN: 11 mg/dL (ref 6–20)
CALCIUM: 9 mg/dL (ref 8.7–10.2)
CHLORIDE: 105 mmol/L (ref 97–108)
CO2: 18 mmol/L (ref 18–29)
Creatinine, Ser: 0.98 mg/dL (ref 0.76–1.27)
GFR calc non Af Amer: 97 mL/min/{1.73_m2} (ref >59)
GFR, EST AFRICAN AMERICAN: 113 mL/min/{1.73_m2} (ref >59)
Glucose: 94 mg/dL (ref 65–99)
Potassium: 3.9 mmol/L (ref 3.5–5.2)
Sodium: 141 mmol/L (ref 134–144)

## 2015-08-04 LAB — LIPID PANEL
CHOL/HDL RATIO: 5.4 ratio — AB (ref 0.0–5.0)
Cholesterol, Total: 212 mg/dL — ABNORMAL HIGH (ref 100–199)
HDL: 39 mg/dL — ABNORMAL LOW (ref >39)
LDL CALC: 102 mg/dL — AB (ref 0–99)
Triglycerides: 355 mg/dL — ABNORMAL HIGH (ref 0–149)
VLDL Cholesterol Cal: 71 mg/dL — ABNORMAL HIGH (ref 5–40)

## 2015-08-04 LAB — HEPATIC FUNCTION PANEL
ALBUMIN: 4.2 g/dL (ref 3.5–5.5)
ALT: 20 IU/L (ref 0–44)
AST: 12 IU/L (ref 0–40)
Alkaline Phosphatase: 95 IU/L (ref 39–117)
BILIRUBIN TOTAL: 0.3 mg/dL (ref 0.0–1.2)
Bilirubin, Direct: 0.08 mg/dL (ref 0.00–0.40)
Total Protein: 6.7 g/dL (ref 6.0–8.5)

## 2015-08-04 LAB — TSH: TSH: 2.15 u[IU]/mL (ref 0.450–4.500)

## 2015-08-07 ENCOUNTER — Ambulatory Visit (INDEPENDENT_AMBULATORY_CARE_PROVIDER_SITE_OTHER): Payer: Commercial Indemnity | Admitting: Family Medicine

## 2015-08-07 ENCOUNTER — Encounter: Payer: Self-pay | Admitting: Family Medicine

## 2015-08-07 VITALS — BP 120/78 | Ht 68.0 in | Wt 190.5 lb

## 2015-08-07 DIAGNOSIS — Z0000 Encounter for general adult medical examination without abnormal findings: Secondary | ICD-10-CM

## 2015-08-07 NOTE — Patient Instructions (Signed)
Results for orders placed or performed in visit on 07/05/15  Lipid panel  Result Value Ref Range   Cholesterol, Total 212 (H) 100 - 199 mg/dL   Triglycerides 355 (H) 0 - 149 mg/dL   HDL 39 (L) >39 mg/dL   VLDL Cholesterol Cal 71 (H) 5 - 40 mg/dL   LDL Calculated 102 (H) 0 - 99 mg/dL   Chol/HDL Ratio 5.4 (H) 0.0 - 5.0 ratio units  Hepatic function panel  Result Value Ref Range   Total Protein 6.7 6.0 - 8.5 g/dL   Albumin 4.2 3.5 - 5.5 g/dL   Bilirubin Total 0.3 0.0 - 1.2 mg/dL   Bilirubin, Direct 0.08 0.00 - 0.40 mg/dL   Alkaline Phosphatase 95 39 - 117 IU/L   AST 12 0 - 40 IU/L   ALT 20 0 - 44 IU/L  Basic metabolic panel  Result Value Ref Range   Glucose 94 65 - 99 mg/dL   BUN 11 6 - 20 mg/dL   Creatinine, Ser 0.98 0.76 - 1.27 mg/dL   GFR calc non Af Amer 97 >59 mL/min/1.73   GFR calc Af Amer 113 >59 mL/min/1.73   BUN/Creatinine Ratio 11 8 - 19   Sodium 141 134 - 144 mmol/L   Potassium 3.9 3.5 - 5.2 mmol/L   Chloride 105 97 - 108 mmol/L   CO2 18 18 - 29 mmol/L   Calcium 9.0 8.7 - 10.2 mg/dL  TSH  Result Value Ref Range   TSH 2.150 0.450 - 4.500 uIU/mL

## 2015-08-07 NOTE — Progress Notes (Signed)
Subjective:    Patient ID: Andrew Mode Sr., male    DOB: [birth year], 39 y.o.   MRN: [id number]  HPI The patient comes in today for a wellness visit. Patient reports depression is tremendously improved on medication. Also notes reflux is tremendously improved   A review of their health history was completed.  A review of medications was also completed.  Any needed refills: none  Eating habits: not good  Falls/  MVA accidents in past few months: none  Regular exercise: none  Specialist pt sees on regular basis: none  Preventative health issues were discussed.   Additional concerns: Possibly borderline diabetic  Results for orders placed or performed in visit on 07/05/15  Lipid panel  Result Value Ref Range   Cholesterol, Total 212 (H) 100 - 199 mg/dL   Triglycerides 355 (H) 0 - 149 mg/dL   HDL 39 (L) >39 mg/dL   VLDL Cholesterol Cal 71 (H) 5 - 40 mg/dL   LDL Calculated 102 (H) 0 - 99 mg/dL   Chol/HDL Ratio 5.4 (H) 0.0 - 5.0 ratio units  Hepatic function panel  Result Value Ref Range   Total Protein 6.7 6.0 - 8.5 g/dL   Albumin 4.2 3.5 - 5.5 g/dL   Bilirubin Total 0.3 0.0 - 1.2 mg/dL   Bilirubin, Direct 0.08 0.00 - 0.40 mg/dL   Alkaline Phosphatase 95 39 - 117 IU/L   AST 12 0 - 40 IU/L   ALT 20 0 - 44 IU/L  Basic metabolic panel  Result Value Ref Range   Glucose 94 65 - 99 mg/dL   BUN 11 6 - 20 mg/dL   Creatinine, Ser 0.98 0.76 - 1.27 mg/dL   GFR calc non Af Amer 97 >59 mL/min/1.73   GFR calc Af Amer 113 >59 mL/min/1.73   BUN/Creatinine Ratio 11 8 - 19   Sodium 141 134 - 144 mmol/L   Potassium 3.9 3.5 - 5.2 mmol/L   Chloride 105 97 - 108 mmol/L   CO2 18 18 - 29 mmol/L   Calcium 9.0 8.7 - 10.2 mg/dL  TSH  Result Value Ref Range   TSH 2.150 0.450 - 4.500 uIU/mL    Review of Systems  Constitutional: Negative for fever, activity change and appetite change.  HENT: Negative for congestion and rhinorrhea.   Eyes: Negative for discharge.  Respiratory:  Negative for cough and wheezing.   Cardiovascular: Negative for chest pain.  Gastrointestinal: Negative for vomiting, abdominal pain and blood in stool.  Genitourinary: Negative for frequency and difficulty urinating.  Musculoskeletal: Negative for neck pain.  Skin: Negative for rash.  Allergic/Immunologic: Negative for environmental allergies and food allergies.  Neurological: Negative for weakness and headaches.  Psychiatric/Behavioral: Negative for agitation.  All other systems reviewed and are negative.      Objective:   Physical Exam  Constitutional: He appears well-developed and well-nourished.  HENT:  Head: Normocephalic and atraumatic.  Right Ear: External ear normal.  Left Ear: External ear normal.  Nose: Nose normal.  Mouth/Throat: Oropharynx is clear and moist.  Eyes: EOM are normal. Pupils are equal, round, and reactive to light.  Neck: Normal range of motion. Neck supple. No thyromegaly present.  Cardiovascular: Normal rate, regular rhythm and normal heart sounds.   No murmur heard. Pulmonary/Chest: Effort normal and breath sounds normal. No respiratory distress. He has no wheezes.  Abdominal: Soft. Bowel sounds are normal. He exhibits no distension and no mass. There is no tenderness.  Genitourinary: Penis normal.  Prostate  within normal limits  Musculoskeletal: Normal range of motion. He exhibits no edema.  Lymphadenopathy:    He has no cervical adenopathy.  Neurological: He is alert. He exhibits normal muscle tone.  Skin: Skin is warm and dry. No erythema.  Psychiatric: He has a normal mood and affect. His behavior is normal. Judgment normal.  Vitals reviewed.         Assessment & Plan:  Impression 1 wellness exam #2 depression much improved #3 reflux much improved plan blood work discussed at length. Diet exercise discussed. Maintain same meds recheck in several months WSL

## 2015-11-07 ENCOUNTER — Ambulatory Visit: Payer: Managed Care, Other (non HMO) | Admitting: Family Medicine

## 2016-04-23 ENCOUNTER — Encounter (HOSPITAL_COMMUNITY): Payer: Self-pay | Admitting: Emergency Medicine

## 2016-04-23 ENCOUNTER — Emergency Department (HOSPITAL_COMMUNITY)
Admission: EM | Admit: 2016-04-23 | Discharge: 2016-04-23 | Disposition: A | Discharge status: Home / Self Care | Payer: Managed Care, Other (non HMO) | Attending: Emergency Medicine | Admitting: Emergency Medicine

## 2016-04-23 ENCOUNTER — Emergency Department (HOSPITAL_COMMUNITY): Payer: Managed Care, Other (non HMO)

## 2016-04-23 DIAGNOSIS — F17210 Nicotine dependence, cigarettes, uncomplicated: Secondary | ICD-10-CM | POA: Insufficient documentation

## 2016-04-23 DIAGNOSIS — Z79899 Other long term (current) drug therapy: Secondary | ICD-10-CM | POA: Insufficient documentation

## 2016-04-23 DIAGNOSIS — M25531 Pain in right wrist: Secondary | ICD-10-CM | POA: Insufficient documentation

## 2016-04-23 DIAGNOSIS — M79601 Pain in right arm: Secondary | ICD-10-CM | POA: Diagnosis present

## 2016-04-23 MED ORDER — DICLOFENAC SODIUM 50 MG PO TBEC: 50.0000 mg | DELAYED_RELEASE_TABLET | Freq: Two times a day (BID) | ORAL | Status: DC

## 2016-04-23 NOTE — ED Provider Notes (Signed)
CSN: [id number]     Arrival date & time 04/23/16  2024 History   First MD Initiated Contact with Patient 04/23/16 2135     Chief Complaint  Patient presents with  . Arm Pain     (Consider location/radiation/quality/duration/timing/severity/associated sxs/prior Treatment) Patient is a 40 y.o. male presenting with arm pain. The history is provided by the patient. No language interpreter was used.  Arm Pain This is a new problem. The current episode started yesterday. The problem has been gradually worsening. Associated symptoms include arthralgias and numbness. Nothing aggravates the symptoms. He has tried nothing for the symptoms. The treatment provided moderate relief.  Pt reports he fractured his wrist 18 years ago.  Pt reports recent increase in pain and swelling  History reviewed. No pertinent past medical history. Past Surgical History  Procedure Laterality Date  . Appendectomy    . Hernia repair     Family History  Problem Relation Age of Onset  . Diabetes Mother   . Diabetes Father   . Heart attack Father   . Diabetes Maternal Grandfather   . Heart attack Paternal Grandfather    Social History  Substance Use Topics  . Smoking status: Current Every Day Smoker -- 1.00 packs/day    Types: Cigarettes    Start date: 11/04/1988  . Smokeless tobacco: Former Systems developer    Quit date: 11/13/2012  . Alcohol Use: No    Review of Systems  Musculoskeletal: Positive for arthralgias.  Neurological: Positive for numbness.  All other systems reviewed and are negative.     Allergies  Dimetapp; Penicillins; and Promethazine hcl  Home Medications   Prior to Admission medications   Medication Sig Start Date End Date Taking? Authorizing Provider  buPROPion (WELLBUTRIN SR) 150 MG 12 hr tablet Take 1 tablet (150 mg total) by mouth 2 (two) times daily. 07/05/15   Mikey Kirschner, MD  clonazePAM (KLONOPIN) 1 MG tablet Take 1 tablet (1 mg total) by mouth at bedtime. Patient taking  differently: Take 1 mg by mouth at bedtime as needed (Sleep).  07/05/15   Mikey Kirschner, MD  cyclobenzaprine (FLEXERIL) 10 MG tablet Take 1 tablet (10 mg total) by mouth 2 (two) times daily as needed for muscle spasms. 07/15/15   Fredia Sorrow, MD  metoCLOPramide (REGLAN) 10 MG tablet Take 1 tablet (10 mg total) by mouth every 6 (six) hours as needed for nausea (or headache). [id number]   Delora Fuel, MD  naproxen (NAPROSYN) 500 MG tablet Take 1 tablet (500 mg total) by mouth 2 (two) times daily. 07/15/15   Fredia Sorrow, MD  ranitidine (ZANTAC) 300 MG tablet Take 1 tablet (300 mg total) by mouth at bedtime. 07/05/15   Mikey Kirschner, MD  SUMAtriptan (IMITREX) 50 MG tablet Take 50 mg by mouth every 2 (two) hours as needed for migraine. May repeat in 2 hours if headache persists or recurs.    Historical Provider, MD   BP 130/80 mmHg  Pulse 90  Temp(Src) 98.3 F (36.8 C) (Oral)  Resp 18  Ht 5\' 9"  (1.753 m)  Wt 86.183 kg  BMI 28.05 kg/m2  SpO2 97% Physical Exam  Constitutional: He is oriented to person, place, and time. He appears well-developed and well-nourished.  HENT:  Head: Normocephalic.  Eyes: EOM are normal.  Neck: Normal range of motion.  Pulmonary/Chest: Effort normal.  Abdominal: He exhibits no distension.  Musculoskeletal: He exhibits tenderness.  Decreased range of motion,  Swelling,  Pain with movement, nv and ns intact  Neurological: He is alert and oriented to person, place, and time.  Psychiatric: He has a normal mood and affect.  Nursing note and vitals reviewed.   ED Course  Procedures (including critical care time) Labs Review Labs Reviewed - No data to display  Imaging Review Dg Wrist Complete Right  04/23/2016  CLINICAL DATA:  Remote history of a wrist fracture. Intermittent wrist pain. EXAM: RIGHT WRIST - COMPLETE 3+ VIEW COMPARISON:  None. FINDINGS: Evidence of a remote distal radius fracture and ulnar styloid avulsion fracture. Moderate radiocarpal  degenerative changes, likely posttraumatic. No acute bony findings. The intercarpal joint spaces are maintained. IMPRESSION: Remote posttraumatic changes involving the distal radius and ulna. 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 Electronically Signed   By: Marijo Sanes M.D.   On: 04/23/2016 21:12   I have personally reviewed and evaluated these images and lab results as part of my medical decision-making.   EKG Interpretation None      MDM   Final diagnoses:  Right wrist pain    Wrist splint Meds ordered this encounter  Medications  . diclofenac (VOLTAREN) 50 MG EC tablet    Sig: Take 1 tablet (50 mg total) by mouth 2 (two) times daily.    Dispense:  20 tablet    Refill:  0    Order Specific Question:  Supervising Provider    Answer:  Noemi Chapel [[id number]]      Hollace Kinnier Hardy, PA-C 04/23/16 2156  Ezequiel Essex, MD 04/24/16 [phone]

## 2016-04-23 NOTE — Discharge Instructions (Signed)
Wrist Pain There are many things that can cause wrist pain. Some common causes include:  An injury to the wrist area, such as a sprain, strain, or fracture.  Overuse of the joint.  A condition that causes increased pressure on a nerve in the wrist (carpal tunnel syndrome).  Wear and tear of the joints that occurs with aging (osteoarthritis).  A variety of other types of arthritis. Sometimes, the cause of wrist pain is not known. The pain often goes away when you follow your health care provider's instructions for relieving pain at home. If your wrist pain continues, tests may need to be done to diagnose your condition. HOME CARE INSTRUCTIONS Pay attention to any changes in your symptoms. Take these actions to help with your pain:  Rest the wrist area for at least 48 hours or as told by your health care provider.  If directed, apply ice to the injured area:  Put ice in a plastic bag.  Place a towel between your skin and the bag.  Leave the ice on for 20 minutes, 2-3 times per day.  Keep your arm raised (elevated) above the level of your heart while you are sitting or lying down.  If a splint or elastic bandage has been applied, use it as told by your health care provider.  Remove the splint or bandage only as told by your health care provider.  Loosen the splint or bandage if your fingers become numb or have a tingling feeling, or if they turn cold or blue.  Take over-the-counter and prescription medicines only as told by your health care provider.  Keep all follow-up visits as told by your health care provider. This is important. SEEK MEDICAL CARE IF:  Your pain is not helped by treatment.  Your pain gets worse. SEEK IMMEDIATE MEDICAL CARE IF:  Your fingers become swollen.  Your fingers turn white, very red, or cold and blue.  Your fingers are numb or have a tingling feeling.  You have difficulty moving your fingers.   This information is not intended to replace  advice given to you by your health care provider. Make sure you discuss any questions you have with your health care provider.   Document Released: 07/31/2005 Document Revised: 07/12/2015 Document Reviewed: 03/08/2015 Elsevier Interactive Patient Education 2016 [employer]  

## 2016-04-23 NOTE — ED Notes (Signed)
Pt c/o rt wrist pain when he puts pressure on the area. Pt denies any injury.

## 2016-07-09 IMAGING — DX DG HIP (WITH OR WITHOUT PELVIS) 5+V BILAT
5 series · 5 of 5 positions shown · non-contrast
Comparison: None.

CLINICAL DATA: Right posterior thigh pain starting below the
buttocks and shooting down to the knee. Pain started yesterday at 11
p.m.. No known injury.

EXAM:
DG HIP (WITH OR WITHOUT PELVIS) 5+V BILAT

[pelvis ap]
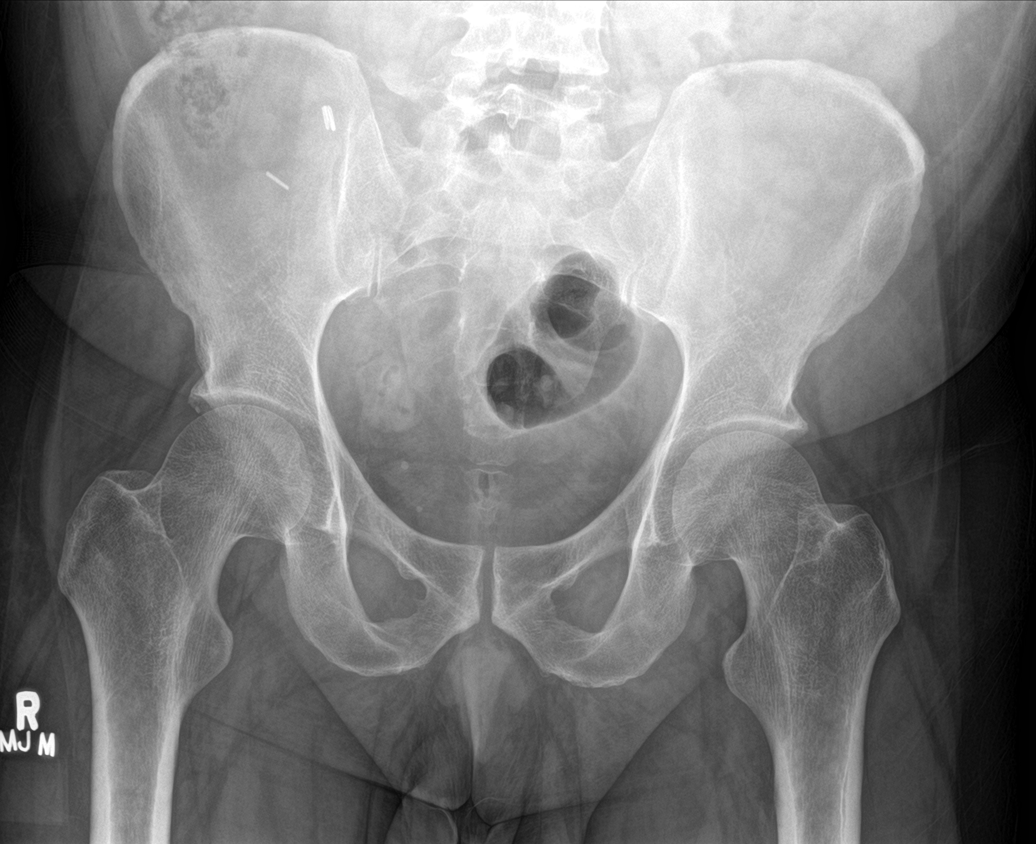

[hip ap (1 of 2)]
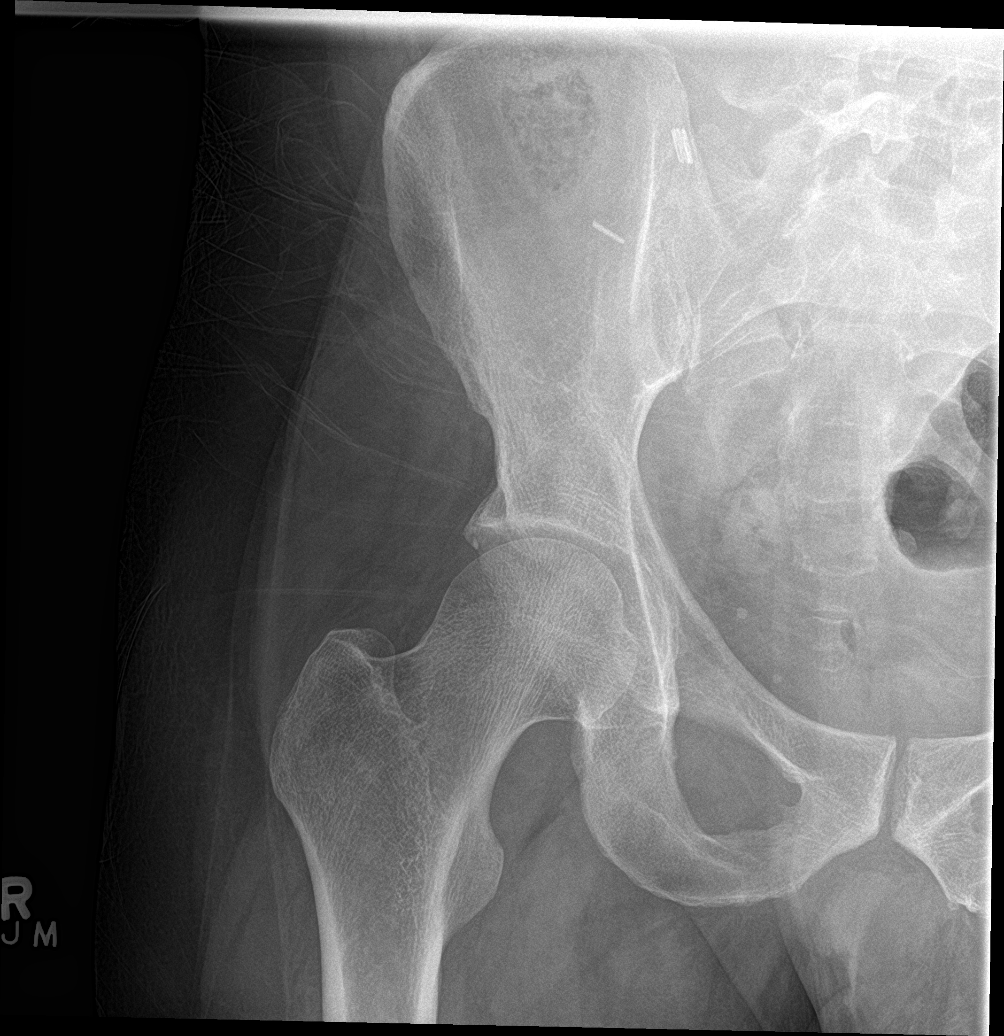

[hip lat (1 of 2)]
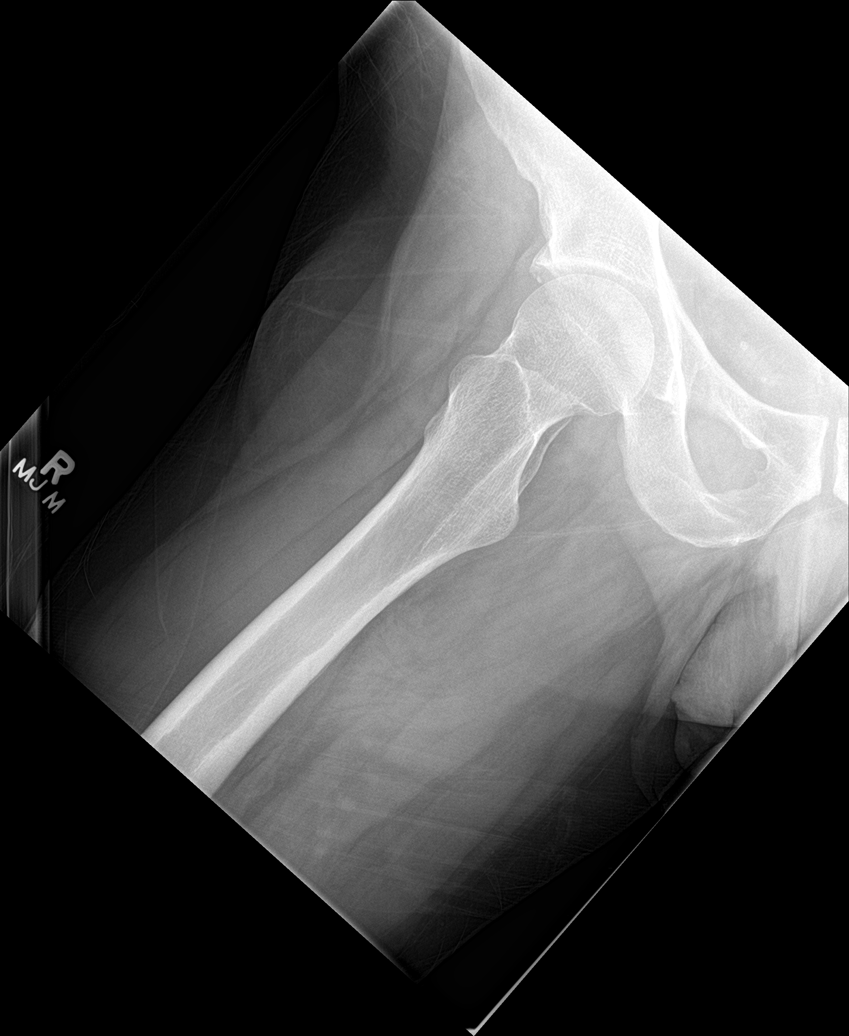

[hip ap (2 of 2)]
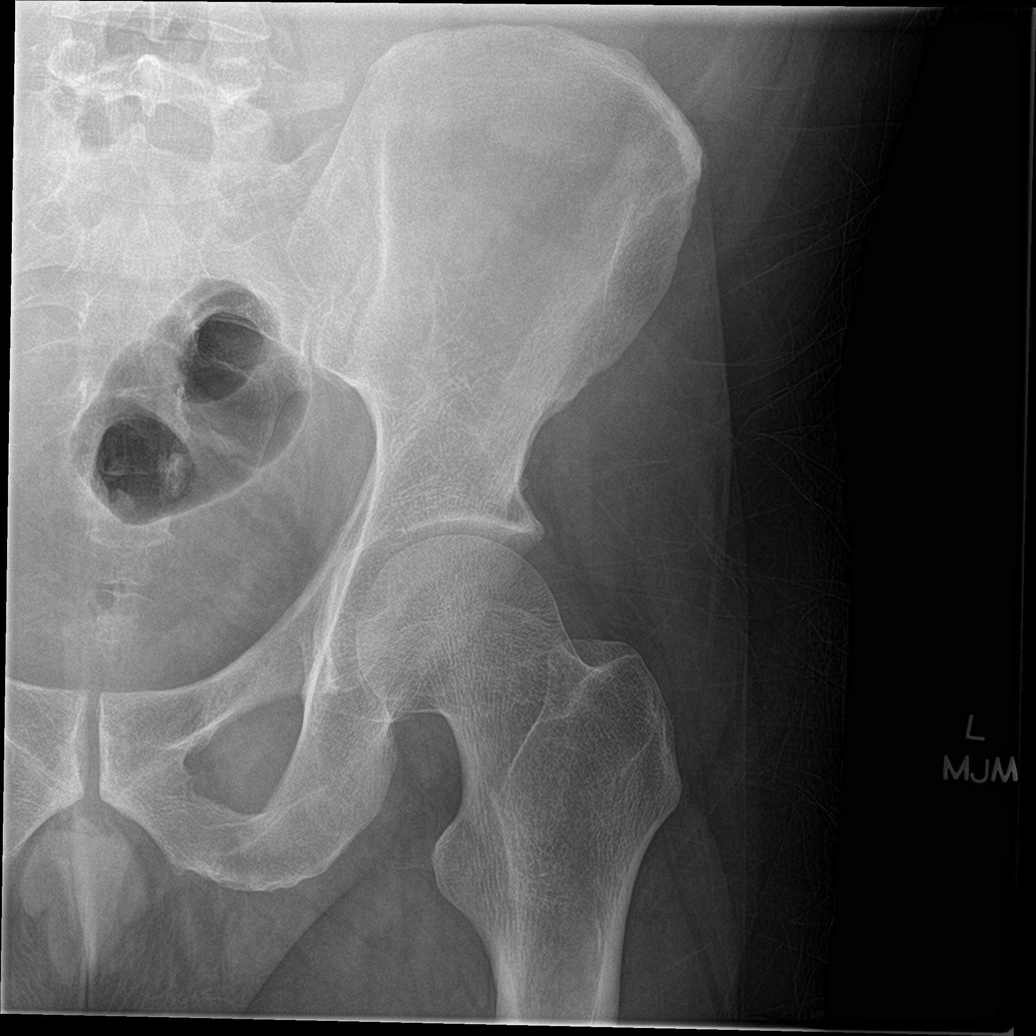

[hip lat (2 of 2)]
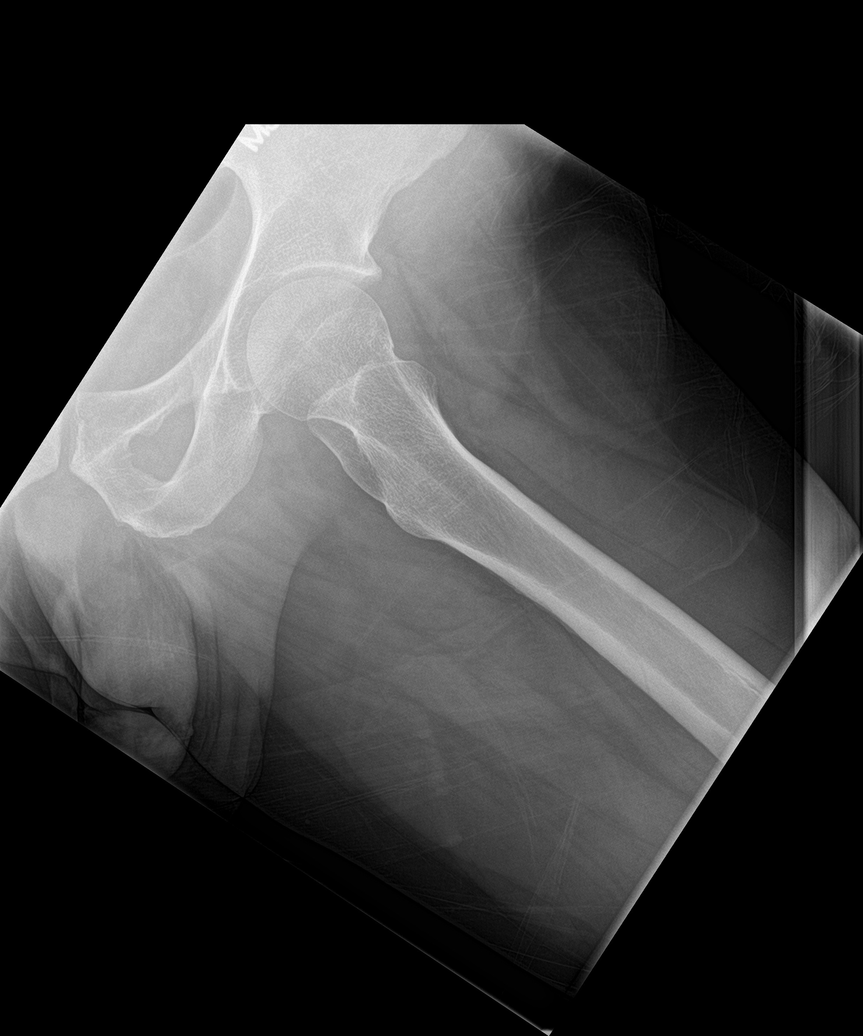

[5 of 5 positions shown; findings below may reference images not displayed]

FINDINGS: There is no evidence of hip fracture or dislocation. There is no
evidence of arthropathy or other focal bone abnormality.
IMPRESSION: Negative.

## 2016-07-09 IMAGING — DX DG FEMUR 2+V*R*
4 series · 4 of 4 positions shown · non-contrast
Comparison: None.

CLINICAL DATA: Right posterior thigh pain starting below the
buttocks and shooting down to the knee. Pain started 11 p.m.
yesterday. No known injury.

EXAM:
RIGHT FEMUR 2 VIEWS

[femur ap (1 of 2)]
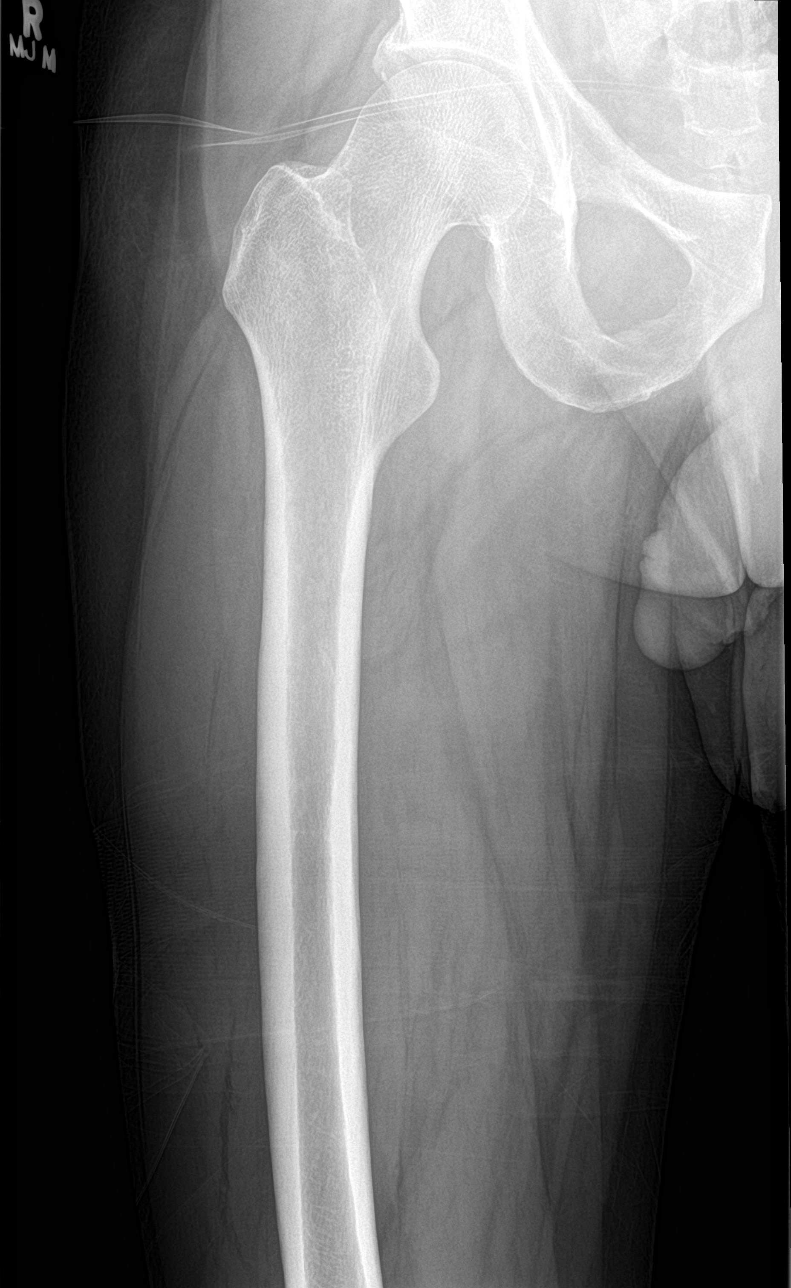

[femur ap (2 of 2)]
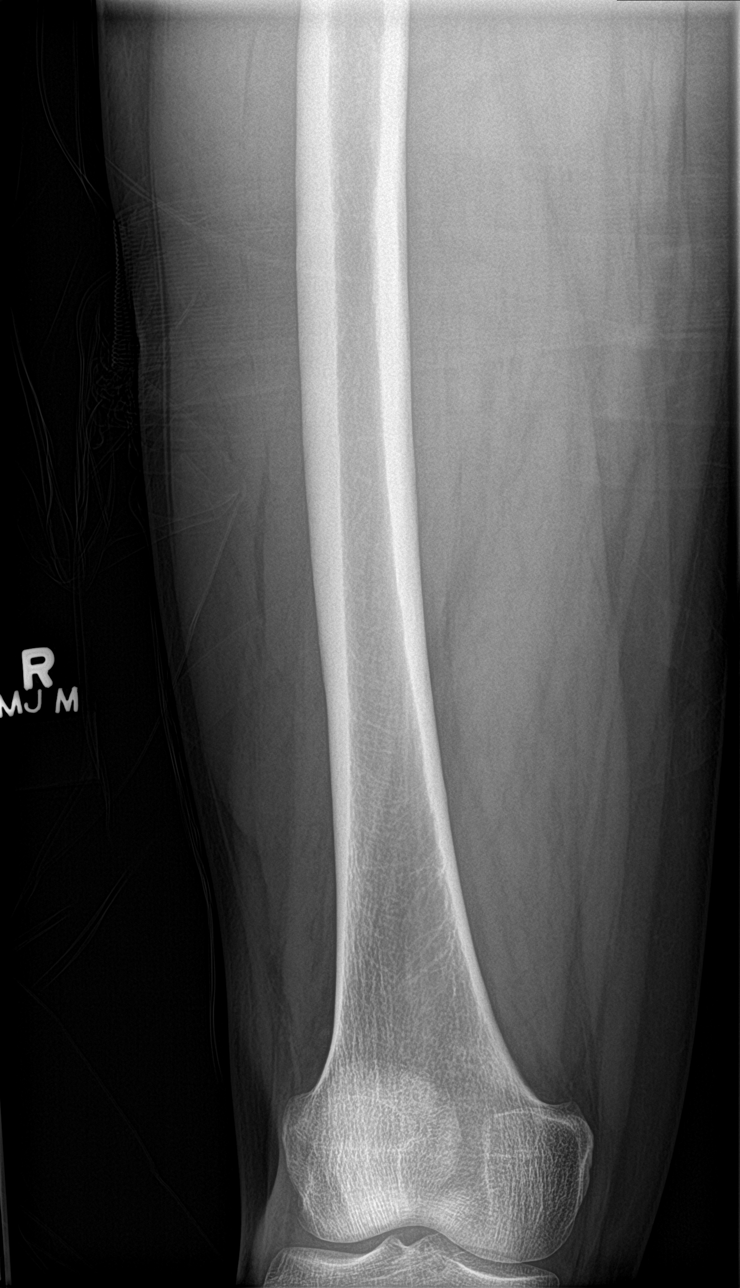

[femur lat (1 of 2)]
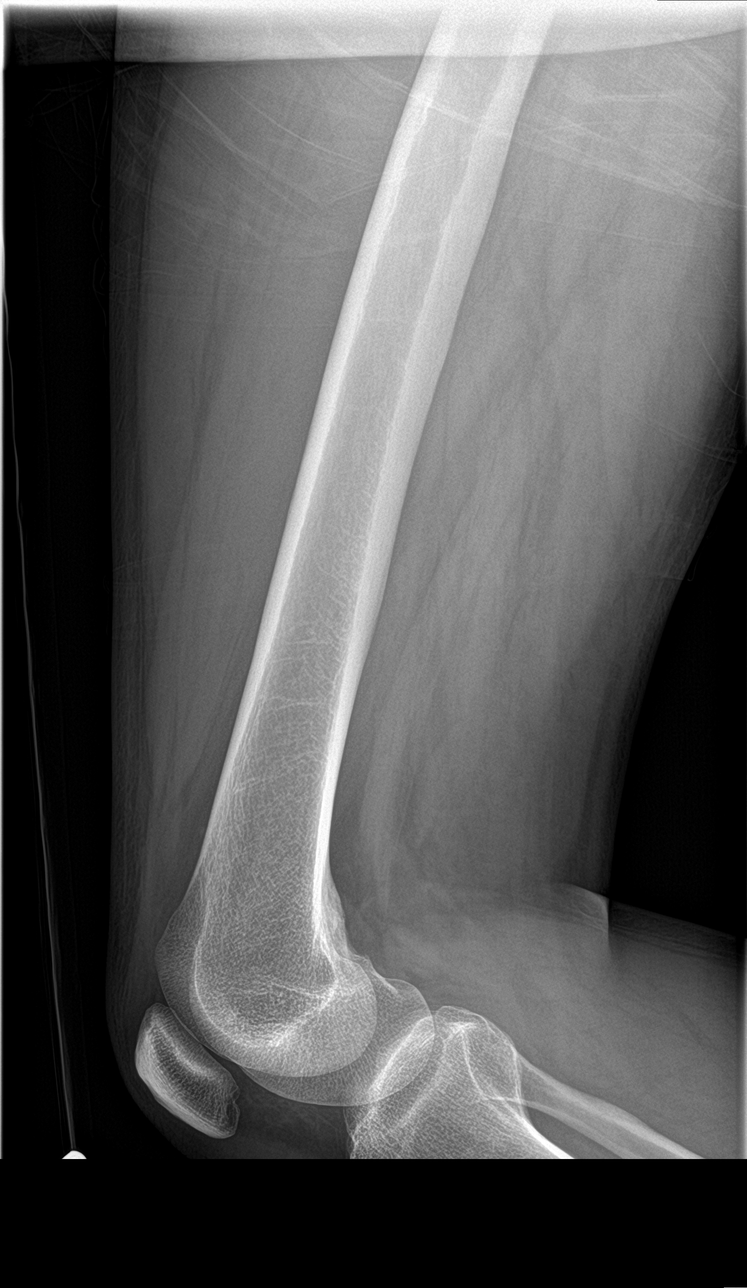

[femur lat (2 of 2)]
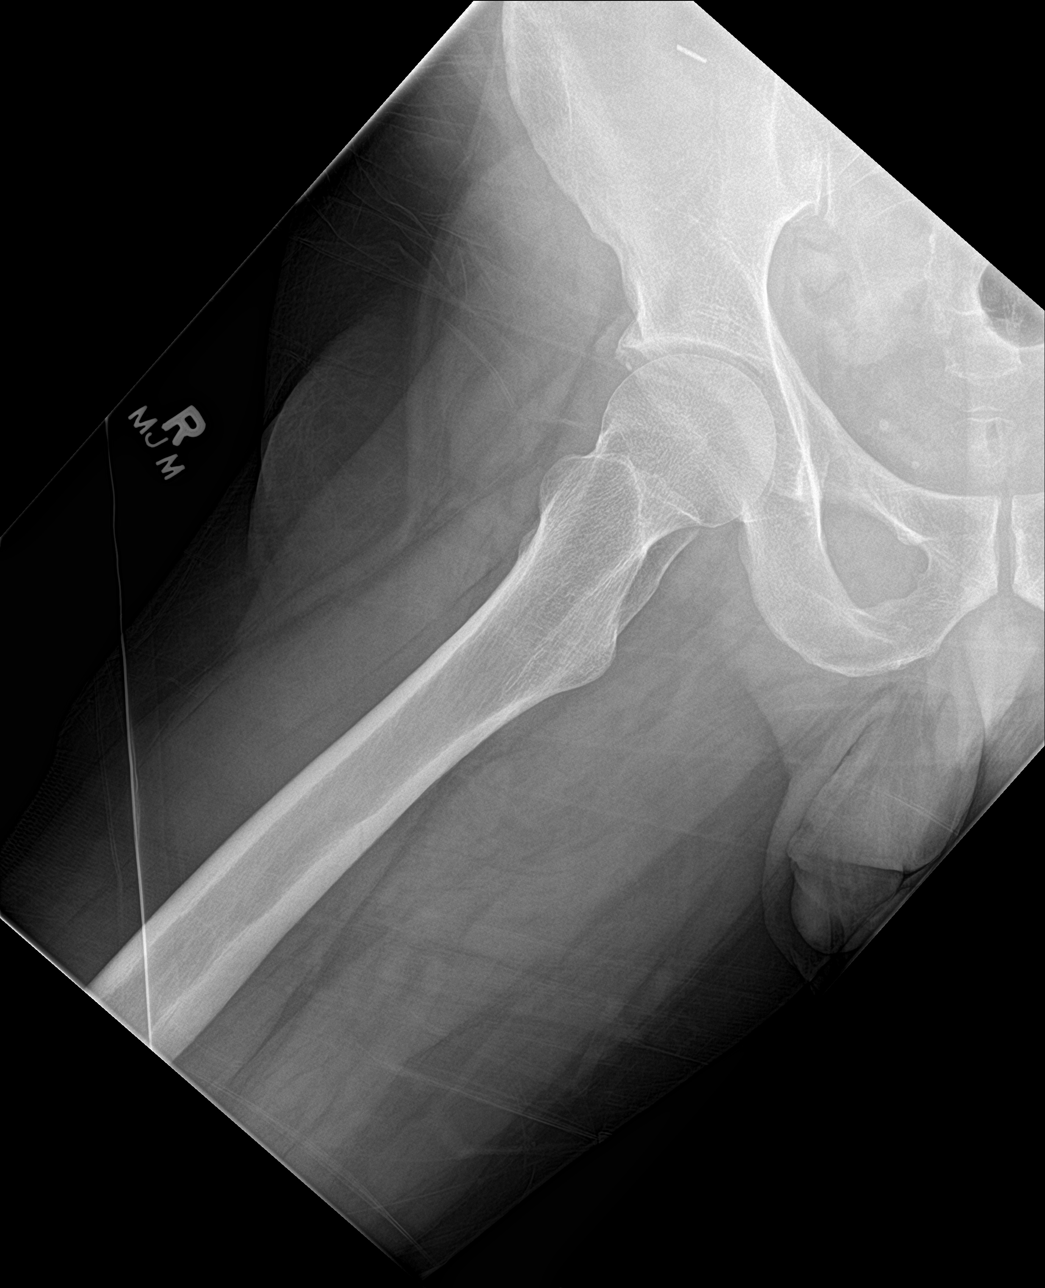

[4 of 4 positions shown; findings below may reference images not displayed]

FINDINGS: There is no evidence of fracture or other focal bone lesions. Soft
tissues are unremarkable.
IMPRESSION: Negative.

## 2016-07-18 ENCOUNTER — Encounter (HOSPITAL_COMMUNITY): Payer: Self-pay

## 2016-07-18 ENCOUNTER — Emergency Department (HOSPITAL_COMMUNITY)
Admission: EM | Admit: 2016-07-18 | Discharge: 2016-07-19 | Disposition: A | Discharge status: Home / Self Care | Payer: Managed Care, Other (non HMO) | Attending: Emergency Medicine | Admitting: Emergency Medicine

## 2016-07-18 DIAGNOSIS — Y939 Activity, unspecified: Secondary | ICD-10-CM | POA: Insufficient documentation

## 2016-07-18 DIAGNOSIS — Z23 Encounter for immunization: Secondary | ICD-10-CM | POA: Insufficient documentation

## 2016-07-18 DIAGNOSIS — S61210A Laceration without foreign body of right index finger without damage to nail, initial encounter: Secondary | ICD-10-CM | POA: Diagnosis not present

## 2016-07-18 DIAGNOSIS — Z79899 Other long term (current) drug therapy: Secondary | ICD-10-CM | POA: Diagnosis not present

## 2016-07-18 DIAGNOSIS — F17210 Nicotine dependence, cigarettes, uncomplicated: Secondary | ICD-10-CM | POA: Diagnosis not present

## 2016-07-18 DIAGNOSIS — S61219A Laceration without foreign body of unspecified finger without damage to nail, initial encounter: Secondary | ICD-10-CM

## 2016-07-18 DIAGNOSIS — Y999 Unspecified external cause status: Secondary | ICD-10-CM | POA: Insufficient documentation

## 2016-07-18 DIAGNOSIS — S6991XA Unspecified injury of right wrist, hand and finger(s), initial encounter: Secondary | ICD-10-CM | POA: Diagnosis present

## 2016-07-18 DIAGNOSIS — Y929 Unspecified place or not applicable: Secondary | ICD-10-CM | POA: Insufficient documentation

## 2016-07-18 DIAGNOSIS — W268XXA Contact with other sharp object(s), not elsewhere classified, initial encounter: Secondary | ICD-10-CM | POA: Diagnosis not present

## 2016-07-18 NOTE — ED Triage Notes (Signed)
Laceration to right index finger.  Happened around 1 hour ago.  Cut it at work over an oven hood vent when cleaning it.  Bandaged and bleeding controlled.

## 2016-07-19 ENCOUNTER — Encounter (HOSPITAL_COMMUNITY): Payer: Self-pay | Admitting: *Deleted

## 2016-07-19 ENCOUNTER — Emergency Department (HOSPITAL_COMMUNITY)
Admission: EM | Admit: 2016-07-19 | Discharge: 2016-07-19 | Disposition: A | Discharge status: Home / Self Care | Payer: Managed Care, Other (non HMO) | Source: Home / Self Care | Attending: Emergency Medicine | Admitting: Emergency Medicine

## 2016-07-19 DIAGNOSIS — X58XXXD Exposure to other specified factors, subsequent encounter: Secondary | ICD-10-CM

## 2016-07-19 DIAGNOSIS — S61219D Laceration without foreign body of unspecified finger without damage to nail, subsequent encounter: Secondary | ICD-10-CM

## 2016-07-19 DIAGNOSIS — F17210 Nicotine dependence, cigarettes, uncomplicated: Secondary | ICD-10-CM

## 2016-07-19 DIAGNOSIS — S61210D Laceration without foreign body of right index finger without damage to nail, subsequent encounter: Secondary | ICD-10-CM

## 2016-07-19 MED ORDER — TETANUS-DIPHTH-ACELL PERTUSSIS 5-2.5-18.5 LF-MCG/0.5 IM SUSP
0.5000 mL | Freq: Once | INTRAMUSCULAR | Status: AC
  Administered 2016-07-19: 0.5 mL via INTRAMUSCULAR
  Filled 2016-07-19: qty 0.5

## 2016-07-19 MED ORDER — GELATIN ABSORBABLE 12-7 MM EX MISC
1.0000 | Freq: Once | CUTANEOUS | Status: AC
Start: 2016-07-19 — End: 2016-07-19
  Administered 2016-07-19: 1 via TOPICAL
  Filled 2016-07-19: qty 1

## 2016-07-19 NOTE — Discharge Instructions (Signed)
Keep the finger clean and bandaged.  Return if needed

## 2016-07-19 NOTE — ED Provider Notes (Signed)
[facility] DEPT Provider Note   CSN: [id number] Arrival date & time: 07/18/16  2146     History   Chief Complaint Chief Complaint  Patient presents with  . Laceration    HPI Andrew Dufek Sr. is a 40 y.o. male.  The history is provided by the patient. No language interpreter was used.  Laceration   The incident occurred 3 to 5 hours ago. The laceration is 1 cm in size. The laceration mechanism is unknown.The pain is mild. The pain has been constant since onset. He reports no foreign bodies present. His tetanus status is out of date.   Pt cut his finger at work.  Pt reports bleeding would not stop History reviewed. No pertinent past medical history.  Patient Active Problem List   Diagnosis Date Noted  . Fatigue 07/05/2015  . Insomnia 07/05/2015  . Depression 07/05/2015  . Esophageal reflux 02/16/2013    Past Surgical History:  Procedure Laterality Date  . APPENDECTOMY    . HERNIA REPAIR         Home Medications    Prior to Admission medications   Medication Sig Start Date End Date Taking? Authorizing Provider  diclofenac (VOLTAREN) 50 MG EC tablet Take 1 tablet (50 mg total) by mouth 2 (two) times daily. 04/23/16   Fransico Meadow, PA-C  ranitidine (ZANTAC) 300 MG tablet Take 1 tablet (300 mg total) by mouth at bedtime. 07/05/15   Mikey Kirschner, MD  SUMAtriptan (IMITREX) 50 MG tablet Take 50 mg by mouth every 2 (two) hours as needed for migraine. May repeat in 2 hours if headache persists or recurs.    Historical Provider, MD    Family History Family History  Problem Relation Age of Onset  . Diabetes Maternal Grandfather   . Heart attack Paternal Grandfather   . Diabetes Mother   . Diabetes Father   . Heart attack Father     Social History Social History  Substance Use Topics  . Smoking status: Current Every Day Smoker    Packs/day: 1.00    Types: Cigarettes    Start date: 11/04/1988  . Smokeless tobacco: Former Systems developer    Quit date: 11/13/2012  .  Alcohol use No     Allergies   Dimetapp [albertsons di bromm]; Penicillins; and Promethazine hcl   Review of Systems Review of Systems  All other systems reviewed and are negative.    Physical Exam Updated Vital Signs BP 113/73 (BP Location: Left Arm)   Pulse 83   Temp 98.1 F (36.7 C) (Oral)   Resp 18   Ht 5\' 9"  (1.753 m)   Wt 86.2 kg   SpO2 100%   BMI 28.06 kg/m   Physical Exam  Constitutional: He appears well-developed and well-nourished.  Neurological: He is alert.  Skin:  11mm laceration nongapping laceration no bleeding  nv and ns intact  Nursing note and vitals reviewed.    ED Treatments / Results  Labs (all labs ordered are listed, but only abnormal results are displayed) Labs Reviewed - No data to display  EKG  EKG Interpretation None       Radiology No results found.  Procedures Procedures (including critical care time)  Medications Ordered in ED Medications  gelatin adsorbable (GELFOAM/SURGIFOAM) sponge 12-7 mm 1 each (not administered)  Tdap (BOOSTRIX) injection 0.5 mL (not administered)     Initial Impression / Assessment and Plan / ED Course  I have reviewed the triage vital signs and the nursing notes.  Pertinent labs & imaging results that were available during my care of the patient were reviewed by me and considered in my medical decision making (see chart for details).  Clinical Course    Gelfoam to wound tetanus  Final Clinical Impressions(s) / ED Diagnoses   Final diagnoses:  Laceration of finger, initial encounter    New Prescriptions New Prescriptions   No medications on file     Fransico Meadow, PA-C 07/19/16 Alexander, Andrew Gillespie 07/19/16 0022

## 2016-07-19 NOTE — ED Notes (Signed)
Pt has small laceration to right index finger with dried blood.  States he took off the dressing and it began bleeding.  Asked pt if he applied any pressure, which he denies.  Pt washed hands in sink and pressure applied with washcloth and bleeding stopped immediately.  Will apply bandaid after provider assessment.

## 2016-07-19 NOTE — ED Triage Notes (Signed)
Pt was seen here for a small laceration to his right hand index finger. Area has no stitches or dermabond. He states this finger was wrapped and he was sent home. Pt pulled his dressing off and pulled this wound apart. No bleeding noted in triage.

## 2016-07-21 NOTE — ED Provider Notes (Signed)
[facility] DEPT Provider Note   CSN: [id number] Arrival date & time: 07/19/16  1721     History   Chief Complaint Chief Complaint  Patient presents with  . Follow-up    HPI Andrew Punzalan Sr. is a 40 y.o. male.  HPI  Andrew Kocik Sr. is a 40 y.o. male who presents to the Emergency Department requesting recheck of a finger laceration.  He was seen here on 9/15 for a laceration of his right index finger.  Gelfoam was applied and Td updated.  Pt removed the gel foam shortly before arrival and noticed bleeding to the wound.  He denies numbness, swelling    History reviewed. No pertinent past medical history.  Patient Active Problem List   Diagnosis Date Noted  . Fatigue 07/05/2015  . Insomnia 07/05/2015  . Depression 07/05/2015  . Esophageal reflux 02/16/2013    Past Surgical History:  Procedure Laterality Date  . APPENDECTOMY    . HERNIA REPAIR         Home Medications    Prior to Admission medications   Medication Sig Start Date End Date Taking? Authorizing Provider  diclofenac (VOLTAREN) 50 MG EC tablet Take 1 tablet (50 mg total) by mouth 2 (two) times daily. 04/23/16   Fransico Meadow, PA-C  ranitidine (ZANTAC) 300 MG tablet Take 1 tablet (300 mg total) by mouth at bedtime. 07/05/15   Mikey Kirschner, MD  SUMAtriptan (IMITREX) 50 MG tablet Take 50 mg by mouth every 2 (two) hours as needed for migraine. May repeat in 2 hours if headache persists or recurs.    Historical Provider, MD    Family History Family History  Problem Relation Age of Onset  . Diabetes Maternal Grandfather   . Heart attack Paternal Grandfather   . Diabetes Mother   . Diabetes Father   . Heart attack Father     Social History Social History  Substance Use Topics  . Smoking status: Current Every Day Smoker    Packs/day: 1.00    Types: Cigarettes    Start date: 11/04/1988  . Smokeless tobacco: Former Systems developer    Quit date: 11/13/2012  . Alcohol use No     Allergies     Dimetapp [albertsons di bromm]; Penicillins; and Promethazine hcl   Review of Systems Review of Systems  Constitutional: Negative for chills and fever.  Musculoskeletal: Negative for arthralgias, back pain and joint swelling.  Skin: Positive for wound.       Finger Laceration   Neurological: Negative for dizziness, weakness and numbness.  Hematological: Does not bruise/bleed easily.  All other systems reviewed and are negative.    Physical Exam Updated Vital Signs BP 121/78 (BP Location: Left Arm)   Pulse 95   Temp 98.4 F (36.9 C) (Oral)   Resp 16   Ht 5\' 9"  (1.753 m)   Wt 86.2 kg   SpO2 100%   BMI 28.06 kg/m   Physical Exam  Constitutional: He is oriented to person, place, and time. He appears well-developed and well-nourished. No distress.  HENT:  Head: Normocephalic and atraumatic.  Cardiovascular: Normal rate, regular rhythm, normal heart sounds and intact distal pulses.   No murmur heard. Pulmonary/Chest: Effort normal and breath sounds normal. No respiratory distress.  Musculoskeletal: He exhibits no edema or tenderness.  Neurological: He is alert and oriented to person, place, and time. He exhibits normal muscle tone. Coordination normal.  Skin: Skin is warm. Laceration noted.  <1 cm superficial lac to the  distal right index finger.  Wound edges well approximated.  No active bleeding.  No erythema.  Sensation intact.    Nursing note and vitals reviewed.    ED Treatments / Results  Labs (all labs ordered are listed, but only abnormal results are displayed) Labs Reviewed - No data to display  EKG  EKG Interpretation None       Radiology No results found.  Procedures Procedures (including critical care time)  Medications Ordered in ED Medications - No data to display   Initial Impression / Assessment and Plan / ED Course  I have reviewed the triage vital signs and the nursing notes.  Pertinent labs & imaging results that were available during  my care of the patient were reviewed by me and considered in my medical decision making (see chart for details).  Clinical Course    Small, superficial lac to the index finger.  Bleeding controlled, NV intact.  No concerning sx's for infection.  Finger bandaged.  Pt reassured.    Final Clinical Impressions(s) / ED Diagnoses   Final diagnoses:  Laceration of finger, subsequent encounter    New Prescriptions Discharge Medication List as of 07/19/2016  6:22 PM       Uma Jerde Ammie Ferrier 07/21/16 0117    Noemi Chapel, MD 07/21/16 [phone]

## 2016-09-24 ENCOUNTER — Encounter: Payer: Self-pay | Admitting: Family Medicine

## 2016-09-24 ENCOUNTER — Ambulatory Visit (INDEPENDENT_AMBULATORY_CARE_PROVIDER_SITE_OTHER): Payer: Managed Care, Other (non HMO) | Admitting: Family Medicine

## 2016-09-24 VITALS — BP 114/76 | Ht 68.0 in | Wt 186.0 lb

## 2016-09-24 DIAGNOSIS — F321 Major depressive disorder, single episode, moderate: Secondary | ICD-10-CM

## 2016-09-24 DIAGNOSIS — Z0000 Encounter for general adult medical examination without abnormal findings: Secondary | ICD-10-CM

## 2016-09-24 DIAGNOSIS — Z23 Encounter for immunization: Secondary | ICD-10-CM

## 2016-09-24 DIAGNOSIS — K219 Gastro-esophageal reflux disease without esophagitis: Secondary | ICD-10-CM

## 2016-09-24 MED ORDER — PANTOPRAZOLE SODIUM 40 MG PO TBEC: 40.0000 mg | DELAYED_RELEASE_TABLET | Freq: Every day | ORAL | 11 refills | Status: DC

## 2016-09-24 MED ORDER — ESCITALOPRAM OXALATE 10 MG PO TABS: 10.0000 mg | ORAL_TABLET | Freq: Every day | ORAL | 5 refills | Status: DC

## 2016-09-24 NOTE — Progress Notes (Signed)
   Subjective:    Patient ID: Andrew Mode Sr., male    DOB: February 21, 1976, 40 y.o.   MRN: [id number]  HPI The patient comes in today for a wellness visit.  Watches diet so so, eats one meal a fday   Hx of hand arthritis  Zantac off and on, take sfaithfully  Full ppd   Sig breakthru reflux o zantac  Pt pretty motivated  No hx seizures   Works at [employer] staying active  Place to walk nearby    A review of their health history was completed.  A review of medications was also completed.  Any needed refills; none  Eating habits: not health conscious  Falls/  MVA accidents in past few months: none  Regular exercise: none  Specialist pt sees on regular basis: Mauri Pole posttraumatic arthritis (Hand)  Preventative health issues were discussed.   Additional concerns: none   Wants flu vaccine.   Had s e from wellbutrin . Patient still having issues with depression. Feeling down. No suicidal thoughts. Just frustrated about his ongoing challenges. All precipitated when his father passed away. Would like to try another medication  No b w yet  Review of Systems  Constitutional: Negative for activity change, appetite change and fever.  HENT: Negative for congestion and rhinorrhea.   Eyes: Negative for discharge.  Respiratory: Negative for cough and wheezing.   Cardiovascular: Negative for chest pain.  Gastrointestinal: Negative for abdominal pain, blood in stool and vomiting.  Genitourinary: Negative for difficulty urinating and frequency.  Musculoskeletal: Negative for neck pain.  Skin: Negative for rash.  Allergic/Immunologic: Negative for environmental allergies and food allergies.  Neurological: Negative for weakness and headaches.  Psychiatric/Behavioral: Negative for agitation.  All other systems reviewed and are negative.      Objective:   Physical Exam  Constitutional: He appears well-developed and well-nourished.  HENT:  Head: Normocephalic and  atraumatic.  Right Ear: External ear normal.  Left Ear: External ear normal.  Nose: Nose normal.  Mouth/Throat: Oropharynx is clear and moist.  Eyes: EOM are normal. Pupils are equal, round, and reactive to light.  Neck: Normal range of motion. Neck supple. No thyromegaly present.  Cardiovascular: Normal rate, regular rhythm and normal heart sounds.   No murmur heard. Pulmonary/Chest: Effort normal and breath sounds normal. No respiratory distress. He has no wheezes.  Abdominal: Soft. Bowel sounds are normal. He exhibits no distension and no mass. There is no tenderness.  Genitourinary: Penis normal.  Musculoskeletal: Normal range of motion. He exhibits no edema.  Lymphadenopathy:    He has no cervical adenopathy.  Neurological: He is alert. He exhibits normal muscle tone.  Skin: Skin is warm and dry. No erythema.  Psychiatric: He has a normal mood and affect. His behavior is normal. Judgment normal.  Vitals reviewed.         Assessment & Plan:  Flu vaccineImpression well adult exam.. We'll do appropriate blood work. And flu vaccine. Diet exercise discussed. #2 depression reemergence of symptoms with need for medication discussed. #3 reflux. Zantac not covering reflux. Switch to Protonix. Rationale discussed. Exercise encouraged. Initiate Lexapro 10 mg daily  Chage to protonix

## 2016-11-22 ENCOUNTER — Telehealth: Payer: Self-pay | Admitting: Pediatrics

## 2016-11-22 MED ORDER — OMEPRAZOLE 40 MG PO CPDR: 40.0000 mg | DELAYED_RELEASE_CAPSULE | Freq: Every day | ORAL | 5 refills | Status: DC

## 2016-11-22 NOTE — Telephone Encounter (Signed)
We rec'd a fax from pt's pharmacy requesting we change pantoprazole to a different PPI, ins will no longer cover pantoprazole. Please advise.

## 2016-11-22 NOTE — Telephone Encounter (Signed)
Ok try omeprazole 40 mg numb 30 five ref

## 2016-11-22 NOTE — Telephone Encounter (Signed)
Does fax suggest alternative? Pt's insurance?

## 2016-11-22 NOTE — Telephone Encounter (Signed)
No alternative. Pt has [place]

## 2016-11-26 ENCOUNTER — Ambulatory Visit (INDEPENDENT_AMBULATORY_CARE_PROVIDER_SITE_OTHER): Payer: Managed Care, Other (non HMO) | Admitting: Family Medicine

## 2016-11-26 ENCOUNTER — Telehealth: Payer: Self-pay | Admitting: Pediatrics

## 2016-11-26 ENCOUNTER — Encounter: Payer: Self-pay | Admitting: Family Medicine

## 2016-11-26 VITALS — BP 108/72 | Ht 68.0 in | Wt 192.1 lb

## 2016-11-26 DIAGNOSIS — F321 Major depressive disorder, single episode, moderate: Secondary | ICD-10-CM

## 2016-11-26 MED ORDER — ESCITALOPRAM OXALATE 20 MG PO TABS: 20.0000 mg | ORAL_TABLET | Freq: Every day | ORAL | 3 refills | Status: DC

## 2016-11-26 NOTE — Progress Notes (Signed)
   Subjective:    Patient ID: Andrew Mode Sr., male    DOB: 1976/03/22, 41 y.o.   MRN: [id number]  HPI Patient is here today for a depression follow up visit. Patient is taking Lexapro 10 mg daily. He states that it is working very well.depression   Patient states that his acid reflux has improved also.   Patient wants to discuss his anxiety, more irritable in nature than usual  Family situation overall ood, normal stresses,     and possible ADHD. Pt has a lot of adhd issues, very forgetful at times, physically hperactive,      Not walking much these days    Review of Systems No headache, no major weight loss or weight gain, no chest pain no back pain abdominal pain no change in bowel habits complete ROS otherwise negative     Objective:   Physical Exam  Alert vitals stable, NAD. Blood pressure good on repeat. HEENT normal. Lungs clear. Heart regular rate and rhythm. Alert no acute distress      Assessment & Plan:  Impression depression clinically much improved. Patient still notes difficulty with focusing and attention but notes it has improved. Also feeling down. Handling Lexapro well plan increase Lexapro to full dose. Follow-up as scheduled. If attention and focusing issues then her still a major issue will do further DSM{ discussion to see if qualifies for adult ADHD diagnosis and treatment

## 2016-11-26 NOTE — Telephone Encounter (Signed)
We rec'd fax from pharmacy last week requesting rx change from pantoprazole to another PPI, ins would not cover.  We switched to omeprazole, they will not cover that either.  I called [employer].  They state this patient's plan will not cover any PPIs at all.  Please advise how you would like to proceed.

## 2016-11-28 NOTE — Telephone Encounter (Signed)
Discussed with pt. Pt verbalized understanding.  °

## 2016-11-28 NOTE — Telephone Encounter (Signed)
Contact his pt now that many of these meds are otc some insur are not covering, Andrew Gillespie is not, needs to purchase otc omeprazle 20 mg daily I f 20 does not work go up to 5

## 2016-11-28 NOTE — Telephone Encounter (Signed)
Left message to return call 

## 2017-02-10 ENCOUNTER — Ambulatory Visit (INDEPENDENT_AMBULATORY_CARE_PROVIDER_SITE_OTHER): Payer: Managed Care, Other (non HMO) | Admitting: Family Medicine

## 2017-02-10 ENCOUNTER — Encounter: Payer: Self-pay | Admitting: Family Medicine

## 2017-02-10 VITALS — BP 112/78 | Ht 68.0 in | Wt 195.0 lb

## 2017-02-10 DIAGNOSIS — F321 Major depressive disorder, single episode, moderate: Secondary | ICD-10-CM | POA: Diagnosis not present

## 2017-02-10 DIAGNOSIS — R5382 Chronic fatigue, unspecified: Secondary | ICD-10-CM | POA: Diagnosis not present

## 2017-02-10 DIAGNOSIS — K219 Gastro-esophageal reflux disease without esophagitis: Secondary | ICD-10-CM

## 2017-02-10 DIAGNOSIS — R739 Hyperglycemia, unspecified: Secondary | ICD-10-CM | POA: Diagnosis not present

## 2017-02-10 LAB — POCT GLUCOSE (DEVICE FOR HOME USE): Glucose Fasting, POC: 117 mg/dL — AB (ref 70–99)

## 2017-02-10 LAB — POCT GLYCOSYLATED HEMOGLOBIN (HGB A1C): Hemoglobin A1C: 4.7

## 2017-02-10 MED ORDER — ESCITALOPRAM OXALATE 20 MG PO TABS: 20.0000 mg | ORAL_TABLET | Freq: Every day | ORAL | 5 refills | Status: DC

## 2017-02-10 NOTE — Progress Notes (Signed)
   Subjective:    Patient ID: Andrew Mode Sr., male    DOB: 15-Jan-1976, 41 y.o.   MRN: [id number]  HPIMed check up on depression.   Dizziness. Of and on with popping sensation in the ears, and then felt drained, if ate orange or apple or something  occas low sugar at times   Started March 25th.  Wants to have blood sugar tested. fbs today 117. Pt states he checks blood sugar at home and has gotten numbers as high as 160. A1c today 4.7   fam hx of diabetes, and g parents,,   Mood overall better, handling meds well, once every couple week, moved in better direction  Exercise not doing good  States overall reflux is improved. Compliant with medication. No obvious side effects.  Review of Systems No headache, no major weight loss or weight gain, no chest pain no back pain abdominal pain no change in bowel habits complete ROS otherwise negative     Objective:   Physical Exam Alert and oriented, vitals reviewed and stable, NAD ENT-TM's and ext canals WNL bilat via otoscopic exam Soft palate, tonsils and post pharynx WNL via oropharyngeal exam Neck-symmetric, no masses; thyroid nonpalpable and nontender Pulmonary-no tachypnea or accessory muscle use; Clear without wheezes via auscultation Card--no abnrml murmurs, rhythm reg and rate WNL Carotid pulses symmetric, without bruits        Assessment & Plan:  Impression #1 intermittent vertigo likely in her ear doubt serious etiology discussed EPRESSION CLINICALLY IMPROVED DISCUSSED MAI#3 reflux stable discussed maintain same medication 4 elevated sugars, with concern regarding diabetes. A1c very low-dose of not initiate. Plan maintain Lexapro. Maintain Prilosec. Diet exercise discussed. Try not to skip meals cut down sugar intake

## 2017-02-14 ENCOUNTER — Ambulatory Visit: Payer: Managed Care, Other (non HMO) | Admitting: Family Medicine

## 2017-03-11 ENCOUNTER — Other Ambulatory Visit: Payer: Self-pay | Admitting: *Deleted

## 2017-03-11 MED ORDER — ESCITALOPRAM OXALATE 20 MG PO TABS: 20.0000 mg | ORAL_TABLET | Freq: Every day | ORAL | 1 refills | Status: DC

## 2017-07-08 ENCOUNTER — Emergency Department (HOSPITAL_COMMUNITY)
Admission: EM | Admit: 2017-07-08 | Discharge: 2017-07-08 | Disposition: A | Discharge status: Home Health | Payer: Managed Care, Other (non HMO) | Attending: Emergency Medicine | Admitting: Emergency Medicine

## 2017-07-08 ENCOUNTER — Emergency Department (HOSPITAL_COMMUNITY): Payer: Managed Care, Other (non HMO)

## 2017-07-08 ENCOUNTER — Encounter (HOSPITAL_COMMUNITY): Payer: Self-pay | Admitting: *Deleted

## 2017-07-08 DIAGNOSIS — R202 Paresthesia of skin: Secondary | ICD-10-CM | POA: Diagnosis not present

## 2017-07-08 DIAGNOSIS — R51 Headache: Secondary | ICD-10-CM | POA: Insufficient documentation

## 2017-07-08 DIAGNOSIS — R519 Headache, unspecified: Secondary | ICD-10-CM

## 2017-07-08 DIAGNOSIS — R531 Weakness: Secondary | ICD-10-CM | POA: Diagnosis not present

## 2017-07-08 DIAGNOSIS — F17210 Nicotine dependence, cigarettes, uncomplicated: Secondary | ICD-10-CM | POA: Insufficient documentation

## 2017-07-08 LAB — CBC WITH DIFFERENTIAL/PLATELET
BASOS ABS: 0 10*3/uL (ref 0.0–0.1)
Basophils Relative: 0 %
Eosinophils Absolute: 0.1 10*3/uL (ref 0.0–0.7)
Eosinophils Relative: 1 %
HEMATOCRIT: 46.5 % (ref 39.0–52.0)
HEMOGLOBIN: 16.1 g/dL (ref 13.0–17.0)
LYMPHS PCT: 23 %
Lymphs Abs: 1.7 10*3/uL (ref 0.7–4.0)
MCH: 31.3 pg (ref 26.0–34.0)
MCHC: 34.6 g/dL (ref 30.0–36.0)
MCV: 90.5 fL (ref 78.0–100.0)
Monocytes Absolute: 0.4 10*3/uL (ref 0.1–1.0)
Monocytes Relative: 6 %
NEUTROS ABS: 5.3 10*3/uL (ref 1.7–7.7)
Neutrophils Relative %: 70 %
Platelets: 201 10*3/uL (ref 150–400)
RBC: 5.14 MIL/uL (ref 4.22–5.81)
RDW: 12.7 % (ref 11.5–15.5)
WBC: 7.6 10*3/uL (ref 4.0–10.5)

## 2017-07-08 LAB — COMPREHENSIVE METABOLIC PANEL
ALBUMIN: 4.3 g/dL (ref 3.5–5.0)
ALT: 37 U/L (ref 17–63)
ANION GAP: 7 (ref 5–15)
AST: 21 U/L (ref 15–41)
Alkaline Phosphatase: 88 U/L (ref 38–126)
BILIRUBIN TOTAL: 0.4 mg/dL (ref 0.3–1.2)
BUN: 13 mg/dL (ref 6–20)
CO2: 23 mmol/L (ref 22–32)
Calcium: 8.9 mg/dL (ref 8.9–10.3)
Chloride: 107 mmol/L (ref 101–111)
Creatinine, Ser: 0.83 mg/dL (ref 0.61–1.24)
GFR calc Af Amer: >60 mL/min (ref >60)
Glucose, Bld: 103 mg/dL — ABNORMAL HIGH (ref 65–99)
POTASSIUM: 3.9 mmol/L (ref 3.5–5.1)
Sodium: 137 mmol/L (ref 135–145)
TOTAL PROTEIN: 7.3 g/dL (ref 6.5–8.1)

## 2017-07-08 MED ORDER — METOCLOPRAMIDE HCL 10 MG PO TABS: 10.0000 mg | ORAL_TABLET | Freq: Four times a day (QID) | ORAL | 0 refills | Status: DC

## 2017-07-08 MED ORDER — METOCLOPRAMIDE HCL 5 MG/ML IJ SOLN
10.0000 mg | Freq: Once | INTRAMUSCULAR | Status: AC
  Administered 2017-07-08: 10 mg via INTRAVENOUS
  Filled 2017-07-08: qty 2

## 2017-07-08 MED ORDER — KETOROLAC TROMETHAMINE 30 MG/ML IJ SOLN
30.0000 mg | Freq: Once | INTRAMUSCULAR | Status: AC
  Administered 2017-07-08: 30 mg via INTRAVENOUS
  Filled 2017-07-08: qty 1

## 2017-07-08 MED ORDER — IBUPROFEN 800 MG PO TABS: 800.0000 mg | ORAL_TABLET | Freq: Three times a day (TID) | ORAL | 0 refills | Status: DC | PRN

## 2017-07-08 MED ORDER — SODIUM CHLORIDE 0.9 % IV BOLUS (SEPSIS)
1000.0000 mL | Freq: Once | INTRAVENOUS | Status: AC
  Administered 2017-07-08: 1000 mL via INTRAVENOUS

## 2017-07-08 MED ORDER — SODIUM CHLORIDE 0.9 % IV BOLUS (SEPSIS)
500.0000 mL | Freq: Once | INTRAVENOUS | Status: AC
  Administered 2017-07-08: 500 mL via INTRAVENOUS

## 2017-07-08 NOTE — Discharge Instructions (Signed)

## 2017-07-08 NOTE — ED Triage Notes (Signed)
Pt states he had a sudden onset of head pain yesterday afternoon at 1700. When this started he began having a sharp feeling pain on his right side (face, neck, arm, and legs). Denies any unilateral weakness. NAD noted

## 2017-07-08 NOTE — ED Triage Notes (Signed)
Pt states at 0100 today pt was unable to move his right leg, states he went back to sleep at woke up at 0900 today and was able to move his leg. Pt can move this leg presently.

## 2017-07-08 NOTE — ED Provider Notes (Signed)
Emergency Department Provider Note   I have reviewed the triage vital signs and the nursing notes.   HISTORY  Chief Complaint Headache   HPI Andrew Gillespie. is a 41 y.o. male with PMH of depression, insomnia, GERD, and prior HA history presents to the emergency department for evaluation of "pins and needles" sensation on the right side of his body with associated sharp HA. Patient states at 5 PM yesterday he had a sudden onset sharp pain in the back right side of his head. He denies any provoking factors. No vision changes. No speech changes. Afterwards he noticed a "pins and needles" sensation any time someone would touch him. Denies any numbness or weakness. The headache was present for approximately 3 hours. The headache resolves spontaneously. Patient was up watching TV at 1 AM with his daughter when the HA returned. This time the patient had a tingling sensation on the right and weakness in the right leg. He did not seek care at this time but did go to bed. When he woke up with AM he had no residual weakness. He reports intermittently having sharp pain in the back of his head last for several minutes and then goes away. No radiation of symptoms. No modifying factors. No fever or chills.    History reviewed. No pertinent past medical history.  Patient Active Problem List   Diagnosis Date Noted  . Fatigue 07/05/2015  . Insomnia 07/05/2015  . Depression 07/05/2015  . Esophageal reflux 02/16/2013    Past Surgical History:  Procedure Laterality Date  . APPENDECTOMY    . HERNIA REPAIR      Current Outpatient Rx  . Order #: [id number] Class: Historical Med  . Order #: [id number] Class: Print  . Order #: [id number] Class: Normal  . Order #: [id number] Class: Normal  . Order #: [id number] Class: Historical Med  . Order #: [id number] Class: Print  . Order #: [id number] Class: Print    Allergies Penicillins; Dimetapp [albertsons di bromm]; Promethazine hcl; and Wellbutrin  [bupropion]  Family History  Problem Relation Age of Onset  . Diabetes Maternal Grandfather   . Heart attack Paternal Grandfather   . Diabetes Mother   . Diabetes Father   . Heart attack Father     Social History Social History  Substance Use Topics  . Smoking status: Current Every Day Smoker    Packs/day: 1.00    Types: Cigarettes    Start date: 11/04/1988  . Smokeless tobacco: Former Systems developer    Quit date: 11/13/2012  . Alcohol use No    Review of Systems  Constitutional: No fever/chills Eyes: No visual changes. ENT: No sore throat. Cardiovascular: Denies chest pain. Respiratory: Denies shortness of breath. Gastrointestinal: No abdominal pain.  No nausea, no vomiting.  No diarrhea.  No constipation. Genitourinary: Negative for dysuria. Musculoskeletal: Negative for back pain. Skin: Negative for rash. Neurological: Negative for intermittent HA, tingling on right side of body, and right leg weakness since resolved.   10-point ROS otherwise negative.  ____________________________________________   PHYSICAL EXAM:  VITAL SIGNS: ED Triage Vitals  Enc Vitals Group     BP 07/08/17 1222 110/82     Pulse Rate 07/08/17 1222 89     Resp 07/08/17 1222 18     Temp 07/08/17 1222 97.9 F (36.6 C)     Temp Source 07/08/17 1222 Oral     SpO2 07/08/17 1222 97 %     Weight 07/08/17 1222 190 lb (86.2 kg)     Height 07/08/17 1222 5'  9" (1.753 m)     Pain Score 07/08/17 1224 8   Constitutional: Alert and oriented. Well appearing and in no acute distress. Eyes: Conjunctivae are normal. PERRL. EOMI. Head: Atraumatic. Nose: No congestion/rhinnorhea. Mouth/Throat: Mucous membranes are moist.  Neck: No stridor.  No meningeal signs. Cardiovascular: Normal rate, regular rhythm. Good peripheral circulation. Grossly normal heart sounds.   Respiratory: Normal respiratory effort.  No retractions. Lungs CTAB. Gastrointestinal: Soft and nontender. No distention.  Musculoskeletal: No lower  extremity tenderness nor edema. No gross deformities of extremities. Neurologic:  Normal speech and language. No gross focal neurologic deficits are appreciated. Normal CN exam 2-12. Reports normal sensation but "pins a needles" sensation when touched on the right face, arm, and leg. No pronator drift. Normal finger-to-nose.  Skin:  Skin is warm, dry and intact. No rash noted. Psychiatric: Mood and affect are normal. Speech and behavior are normal.  ____________________________________________   LABS (all labs ordered are listed, but only abnormal results are displayed)  Labs Reviewed  COMPREHENSIVE METABOLIC PANEL - Abnormal; Notable for the following:       Result Value   Glucose, Bld 103 (*)    All other components within normal limits  CBC WITH DIFFERENTIAL/PLATELET   ____________________________________________  EKG   EKG Interpretation  Date/Time:  Tuesday July 08 2017 12:29:03 EDT Ventricular Rate:  86 PR Interval:    QRS Duration: 95 QT Interval:  357 QTC Calculation: 427 R Axis:   68 Text Interpretation:  Sinus rhythm No STEMI.  Confirmed by Nanda Quinton [phone]) on 07/08/2017 1:03:38 PM       ____________________________________________  RADIOLOGY  Ct Head Wo Contrast  Result Date: 07/08/2017 CLINICAL DATA:  Acute onset headache yesterday. EXAM: CT HEAD WITHOUT CONTRAST TECHNIQUE: Contiguous axial images were obtained from the base of the skull through the vertex without intravenous contrast. COMPARISON:  03/03/2015 FINDINGS: Brain: No evidence of acute infarction, hemorrhage, hydrocephalus, extra-axial collection, or mass lesion/mass effect. Vascular:  No hyperdense vessel or other acute findings. Skull: No evidence of fracture or other significant bone abnormality. Sinuses/Orbits:  No acute findings. Other: None. IMPRESSION: Negative noncontrast head CT. Electronically Signed   By: Earle Gell M.D.   On: 07/08/2017 13:28   Mr Brain Wo Contrast  Result Date:  07/08/2017 CLINICAL DATA:  Right-sided headache for 2 days. EXAM: MRI HEAD WITHOUT CONTRAST TECHNIQUE: Multiplanar, multiecho pulse sequences of the brain and surrounding structures were obtained without intravenous contrast. COMPARISON:  Head CT 07/08/2017.  Brain MRI 11/13/2006 FINDINGS: Brain: No acute infarction, hemorrhage, hydrocephalus, extra-axial collection or mass lesion. 3 or 4 FLAIR hyperintensities in the cerebral white matter from nonspecific remote insult, commonly seen to this degree at this age. No specific demyelinating pattern. Negative for atrophy. Vascular: Major flow voids are preserved. Skull and upper cervical spine: Negative for marrow lesion Sinuses/Orbits: Negative. IMPRESSION: No acute finding or specific explanation for headache. Electronically Signed   By: Monte Fantasia M.D.   On: 07/08/2017 14:28    ____________________________________________   PROCEDURES  Procedure(s) performed:   Procedures  None ____________________________________________   INITIAL IMPRESSION / ASSESSMENT AND PLAN / ED COURSE  Pertinent labs & imaging results that were available during my care of the patient were reviewed by me and considered in my medical decision making (see chart for details).  Patient resents to the emergency department for evaluation of several complaints including intermittent sharp headache, tingling sensation on the right side of the face/arm/leg, and RLE weakness at 1 AM today that  resolved after waking this AM. Headache is somewhat atypical. Low suspicion for SAH, despite sharp/sudden quality, given the intermittent nature. Possible complex migraine type HA. Patient with few risk factors for CVA/TIA. No infection symptoms to suggest meningitis/encephalitis.   03:02 PM Patient feeling much better after HA medication. No longer has tingling sensation on the right side of the body. No weakness. Patient is ambulatory without difficulty. MRI negative. Plan for  neurology follow-up as an outpatient.   At this time, I do not feel there is any life-threatening condition present. I have reviewed and discussed all results (EKG, imaging, lab, urine as appropriate), exam findings with patient. I have reviewed nursing notes and appropriate previous records.  I feel the patient is safe to be discharged home without further emergent workup. Discussed usual and customary return precautions. Patient and family (if present) verbalize understanding and are comfortable with this plan.  Patient will follow-up with their primary care provider. If they do not have a primary care provider, information for follow-up has been provided to them. All questions have been answered.  ____________________________________________  FINAL CLINICAL IMPRESSION(S) / ED DIAGNOSES  Final diagnoses:  Acute nonintractable headache, unspecified headache type  Right sided weakness  Paresthesias     MEDICATIONS GIVEN DURING THIS VISIT:  Medications  sodium chloride 0.9 % bolus 1,000 mL (0 mLs Intravenous Stopped 07/08/17 1339)  ketorolac (TORADOL) 30 MG/ML injection 30 mg (30 mg Intravenous Given 07/08/17 1435)  metoCLOPramide (REGLAN) injection 10 mg (10 mg Intravenous Given 07/08/17 1435)  sodium chloride 0.9 % bolus 500 mL (0 mLs Intravenous Stopped 07/08/17 1520)     NEW OUTPATIENT MEDICATIONS STARTED DURING THIS VISIT:  Discharge Medication List as of 07/08/2017  3:06 PM    START taking these medications   Details  ibuprofen (ADVIL,MOTRIN) 800 MG tablet Take 1 tablet (800 mg total) by mouth every 8 (eight) hours as needed., Starting Tue 07/08/2017, Print    metoCLOPramide (REGLAN) 10 MG tablet Take 1 tablet (10 mg total) by mouth every 6 (six) hours., Starting Tue 07/08/2017, Print        Note:  This document was prepared using Dragon voice recognition software and may include unintentional dictation errors.  Nanda Quinton, MD Emergency Medicine    Long, W[facility], MD 07/09/17  [phone]

## 2017-08-12 ENCOUNTER — Ambulatory Visit: Payer: Managed Care, Other (non HMO) | Admitting: Family Medicine

## 2018-02-06 ENCOUNTER — Emergency Department (HOSPITAL_COMMUNITY)
Admission: EM | Admit: 2018-02-06 | Discharge: 2018-02-06 | Disposition: A | Discharge status: Home / Self Care | Payer: Managed Care, Other (non HMO) | Attending: Emergency Medicine | Admitting: Emergency Medicine

## 2018-02-06 ENCOUNTER — Emergency Department (HOSPITAL_COMMUNITY): Payer: Managed Care, Other (non HMO)

## 2018-02-06 ENCOUNTER — Encounter (HOSPITAL_COMMUNITY): Payer: Self-pay | Admitting: Emergency Medicine

## 2018-02-06 ENCOUNTER — Other Ambulatory Visit: Payer: Self-pay

## 2018-02-06 DIAGNOSIS — F17210 Nicotine dependence, cigarettes, uncomplicated: Secondary | ICD-10-CM | POA: Insufficient documentation

## 2018-02-06 DIAGNOSIS — R0789 Other chest pain: Secondary | ICD-10-CM | POA: Diagnosis present

## 2018-02-06 HISTORY — DX: Hypoglycemia, unspecified: E16.2

## 2018-02-06 LAB — DIFFERENTIAL
Basophils Absolute: 0 10*3/uL (ref 0.0–0.1)
Basophils Relative: 1 %
EOS ABS: 0.2 10*3/uL (ref 0.0–0.7)
EOS PCT: 2 %
LYMPHS ABS: 1.9 10*3/uL (ref 0.7–4.0)
Lymphocytes Relative: 24 %
MONOS PCT: 6 %
Monocytes Absolute: 0.4 10*3/uL (ref 0.1–1.0)
Neutro Abs: 5.4 10*3/uL (ref 1.7–7.7)
Neutrophils Relative %: 67 %

## 2018-02-06 LAB — HEPATIC FUNCTION PANEL
ALBUMIN: 4.3 g/dL (ref 3.5–5.0)
ALK PHOS: 96 U/L (ref 38–126)
ALT: 25 U/L (ref 17–63)
AST: 18 U/L (ref 15–41)
BILIRUBIN TOTAL: 0.6 mg/dL (ref 0.3–1.2)
Bilirubin, Direct: 0.1 mg/dL (ref 0.1–0.5)
Indirect Bilirubin: 0.5 mg/dL (ref 0.3–0.9)
Total Protein: 7.3 g/dL (ref 6.5–8.1)

## 2018-02-06 LAB — CBC
HCT: 48 % (ref 39.0–52.0)
Hemoglobin: 16.1 g/dL (ref 13.0–17.0)
MCH: 31.1 pg (ref 26.0–34.0)
MCHC: 33.5 g/dL (ref 30.0–36.0)
MCV: 92.7 fL (ref 78.0–100.0)
Platelets: 243 10*3/uL (ref 150–400)
RBC: 5.18 MIL/uL (ref 4.22–5.81)
RDW: 12.5 % (ref 11.5–15.5)
WBC: 7.9 10*3/uL (ref 4.0–10.5)

## 2018-02-06 LAB — BASIC METABOLIC PANEL
Anion gap: 11 (ref 5–15)
BUN: 10 mg/dL (ref 6–20)
CO2: 21 mmol/L — ABNORMAL LOW (ref 22–32)
Calcium: 9.3 mg/dL (ref 8.9–10.3)
Chloride: 106 mmol/L (ref 101–111)
Creatinine, Ser: 0.77 mg/dL (ref 0.61–1.24)
GFR calc Af Amer: >60 mL/min (ref >60)
GFR calc non Af Amer: >60 mL/min (ref >60)
Glucose, Bld: 94 mg/dL (ref 65–99)
Potassium: 3.8 mmol/L (ref 3.5–5.1)
Sodium: 138 mmol/L (ref 135–145)

## 2018-02-06 LAB — TROPONIN I
Troponin I: <0.03 ng/mL (ref <0.03)
Troponin I: <0.03 ng/mL (ref <0.03)

## 2018-02-06 NOTE — ED Provider Notes (Signed)
[facility] EMERGENCY DEPARTMENT Provider Note   CSN: [id number] Arrival date & time: 02/06/18  1347     History   Chief Complaint Chief Complaint  Patient presents with  . Chest Pain    HPI Andrew Becherer Sr. is a 42 y.o. male.  Patient states that 2 AM he had chest discomfort with some shortness of breath and sweating for 20 minutes.     The history is provided by the patient.  Chest Pain   This is a new problem. The current episode started 6 to 12 hours ago. The problem occurs rarely. The problem has been resolved. The pain is associated with exertion. The pain is present in the substernal region. The pain is at a severity of 4/10. The pain is moderate. The quality of the pain is described as brief. The pain does not radiate. Pertinent negatives include no abdominal pain, no back pain, no cough and no headaches.  Pertinent negatives for past medical history include no seizures.    Past Medical History:  Diagnosis Date  . Hypoglycemia     Patient Active Problem List   Diagnosis Date Noted  . Fatigue 07/05/2015  . Insomnia 07/05/2015  . Depression 07/05/2015  . Esophageal reflux 02/16/2013    Past Surgical History:  Procedure Laterality Date  . APPENDECTOMY    . HERNIA REPAIR          Home Medications    Prior to Admission medications   Medication Sig Start Date End Date Taking? Authorizing Provider  ranitidine (ZANTAC) 300 MG tablet Take 300 mg by mouth at bedtime.   Yes [provider]    Family History Family History  Problem Relation Age of Onset  . Diabetes Maternal Grandfather   . Heart attack Paternal Grandfather   . Diabetes Mother   . Diabetes Father   . Heart attack Father     Social History Social History   Tobacco Use  . Smoking status: Current Every Day Smoker    Packs/day: 1.00    Types: Cigarettes    Start date: 11/04/1988  . Smokeless tobacco: Former Systems developer    Quit date: 11/13/2012  Substance Use Topics  . Alcohol use:  No  . Drug use: No     Allergies   Penicillins; Dimetapp [albertsons di bromm]; Promethazine hcl; and Wellbutrin [bupropion]   Review of Systems Review of Systems  Constitutional: Negative for appetite change and fatigue.  HENT: Negative for congestion, ear discharge and sinus pressure.   Eyes: Negative for discharge.  Respiratory: Negative for cough.   Cardiovascular: Positive for chest pain.  Gastrointestinal: Negative for abdominal pain and diarrhea.  Genitourinary: Negative for frequency and hematuria.  Musculoskeletal: Negative for back pain.  Skin: Negative for rash.  Neurological: Negative for seizures and headaches.  Psychiatric/Behavioral: Negative for hallucinations.     Physical Exam Updated Vital Signs BP 116/79   Pulse 82   Temp 98.4 F (36.9 C) (Oral)   Resp 20   Ht 5\' 8"  (1.727 m)   Wt 86.2 kg (190 lb)   SpO2 98%   BMI 28.89 kg/m   Physical Exam  Constitutional: He is oriented to person, place, and time. He appears well-developed.  HENT:  Head: Normocephalic.  Eyes: Conjunctivae and EOM are normal. No scleral icterus.  Neck: Neck supple. No thyromegaly present.  Cardiovascular: Normal rate and regular rhythm. Exam reveals no gallop and no friction rub.  No murmur heard. Pulmonary/Chest: No stridor. He has no wheezes. He  has no rales. He exhibits no tenderness.  Abdominal: He exhibits no distension. There is no tenderness. There is no rebound.  Musculoskeletal: Normal range of motion. He exhibits no edema.  Lymphadenopathy:    He has no cervical adenopathy.  Neurological: He is oriented to person, place, and time. He exhibits normal muscle tone. Coordination normal.  Skin: No rash noted. No erythema.  Psychiatric: He has a normal mood and affect. His behavior is normal.     ED Treatments / Results  Labs (all labs ordered are listed, but only abnormal results are displayed) Labs Reviewed  BASIC METABOLIC PANEL - Abnormal; Notable for the  following components:      Result Value   CO2 21 (*)    All other components within normal limits  CBC  TROPONIN I  HEPATIC FUNCTION PANEL  DIFFERENTIAL  TROPONIN I    EKG EKG Interpretation  Date/Time:  Friday February 06 2018 14:12:00 EDT Ventricular Rate:  81 PR Interval:  152 QRS Duration: 98 QT Interval:  350 QTC Calculation: 406 R Axis:   71 Text Interpretation:  Normal sinus rhythm Normal ECG Confirmed by Milton Ferguson [phone]) on 02/06/2018 3:09:56 PM   Radiology Dg Chest 2 View  Result Date: 02/06/2018 CLINICAL DATA:  Central chest pain radiating to the left beginning 12 hours ago. Smoking history. EXAM: CHEST - 2 VIEW COMPARISON:  05/13/2010 FINDINGS: The heart size and mediastinal contours are within normal limits. Both lungs are clear. The visualized skeletal structures are unremarkable except for mild spinal curvature. IMPRESSION: No active cardiopulmonary disease. Electronically Signed   By: Nelson Chimes M.D.   On: 02/06/2018 15:09    Procedures Procedures (including critical care time)  Medications Ordered in ED Medications - No data to display   Initial Impression / Assessment and Plan / ED Course  I have reviewed the triage vital signs and the nursing notes.  Pertinent labs & imaging results that were available during my care of the patient were reviewed by me and considered in my medical decision making (see chart for details).     Patient with chest pain.  2 troponins are negative.  EKG normal chest x-ray normal heart score is 1.  I spoke with hospitalist and it was decided the patient was appropriate for outpatient treatment and will be referred to cardiology..  Patient will return if he has problems prior to seeing the cardiologist  Final Clinical Impressions(s) / ED Diagnoses   Final diagnoses:  Atypical chest pain    ED Discharge Orders    None       Milton Ferguson, MD 02/06/18 [phone]

## 2018-02-06 NOTE — ED Notes (Signed)
Pt provided snack.

## 2018-02-06 NOTE — Discharge Instructions (Signed)
Take 1 aspirin a day.  Follow-up with cardiology next week.  Return if having any problems.  Rest at home this weekend

## 2018-02-06 NOTE — ED Triage Notes (Signed)
Pt c/o LT sided CP that intermittently radiates to LT side of jaw with SOB that began around 0200 this morning.

## 2018-02-06 NOTE — Progress Notes (Signed)
   I was called and asked to consult on this patient.  Dr. Roderic Palau suggested observation for Chest pain rule out.  He is a 42yo with a concerning story.  >12 hours of symptoms.  HEART score 1.  Negative troponin.  Negative EKG.  Given this information, I have encouraged outpatient follow-up for consideration of ischemic evaluation, but this does not appear to be needed as an inpatient at this time.    Karmen Bongo MD [facility]  If note is complete, please contact covering daytime or nighttime physician. [email or web address] Password [facility]  02/06/2018, 4:48 PM

## 2018-03-30 ENCOUNTER — Other Ambulatory Visit: Payer: Self-pay

## 2018-03-30 ENCOUNTER — Emergency Department (HOSPITAL_COMMUNITY)
Admission: EM | Admit: 2018-03-30 | Discharge: 2018-03-30 | Disposition: A | Discharge status: Home / Self Care | Payer: Managed Care, Other (non HMO) | Attending: Emergency Medicine | Admitting: Emergency Medicine

## 2018-03-30 ENCOUNTER — Emergency Department (HOSPITAL_COMMUNITY): Payer: Managed Care, Other (non HMO)

## 2018-03-30 ENCOUNTER — Encounter (HOSPITAL_COMMUNITY): Payer: Self-pay | Admitting: *Deleted

## 2018-03-30 DIAGNOSIS — M25511 Pain in right shoulder: Secondary | ICD-10-CM | POA: Insufficient documentation

## 2018-03-30 DIAGNOSIS — Y999 Unspecified external cause status: Secondary | ICD-10-CM | POA: Diagnosis not present

## 2018-03-30 DIAGNOSIS — F17210 Nicotine dependence, cigarettes, uncomplicated: Secondary | ICD-10-CM | POA: Insufficient documentation

## 2018-03-30 DIAGNOSIS — Y939 Activity, unspecified: Secondary | ICD-10-CM | POA: Insufficient documentation

## 2018-03-30 DIAGNOSIS — Y929 Unspecified place or not applicable: Secondary | ICD-10-CM | POA: Diagnosis not present

## 2018-03-30 DIAGNOSIS — S4991XA Unspecified injury of right shoulder and upper arm, initial encounter: Secondary | ICD-10-CM | POA: Diagnosis present

## 2018-03-30 DIAGNOSIS — G8929 Other chronic pain: Secondary | ICD-10-CM

## 2018-03-30 DIAGNOSIS — X509XXA Other and unspecified overexertion or strenuous movements or postures, initial encounter: Secondary | ICD-10-CM | POA: Insufficient documentation

## 2018-03-30 MED ORDER — DICLOFENAC SODIUM 75 MG PO TBEC: 75.0000 mg | DELAYED_RELEASE_TABLET | Freq: Two times a day (BID) | ORAL | 0 refills | Status: DC

## 2018-03-30 NOTE — ED Triage Notes (Signed)
Pt c/o right shoulder pain since January, pt states that he fell in march and the pain has become worse. Unable to have full rom of right shoulder due to pain,

## 2018-03-30 NOTE — ED Provider Notes (Signed)
[facility] EMERGENCY DEPARTMENT Provider Note   CSN: [id number] Arrival date & time: 03/30/18  2058     History   Chief Complaint Chief Complaint  Patient presents with  . Shoulder Pain    HPI Andrew Obryan Sr. is a 42 y.o. male.  HPI  Andrew Tickner Sr. is a 42 y.o. male who presents to the Emergency Department complaining of right shoulder pain since January.  He describes an aching constant pain to the front of his right shoulder that is associated with movement.  He states his symptoms have been worsening since onset and increasing for several days prompting him to seek emergency room evaluation.  He describes a clicking or grinding sensation when he moves his arm outward.  He denies known injury.  He does admit to a fall in March, but states his shoulder was hurting before then.  He does admit to frequent and heavy lifting reaching and pulling at his job.  He denies numbness pain or weakness into his upper extremity, swelling, neck pain, or pain that radiates into his arm.    Past Medical History:  Diagnosis Date  . Hypoglycemia     Patient Active Problem List   Diagnosis Date Noted  . Fatigue 07/05/2015  . Insomnia 07/05/2015  . Depression 07/05/2015  . Esophageal reflux 02/16/2013    Past Surgical History:  Procedure Laterality Date  . APPENDECTOMY    . HERNIA REPAIR          Home Medications    Prior to Admission medications   Medication Sig Start Date End Date Taking? Authorizing Provider  ranitidine (ZANTAC) 300 MG tablet Take 300 mg by mouth at bedtime.    [provider]    Family History Family History  Problem Relation Age of Onset  . Diabetes Maternal Grandfather   . Heart attack Paternal Grandfather   . Diabetes Mother   . Diabetes Father   . Heart attack Father     Social History Social History   Tobacco Use  . Smoking status: Current Every Day Smoker    Packs/day: 1.00    Types: Cigarettes    Start date: 11/04/1988    . Smokeless tobacco: Former Systems developer    Quit date: 11/13/2012  Substance Use Topics  . Alcohol use: No  . Drug use: No     Allergies   Penicillins; Dimetapp [albertsons di bromm]; Promethazine hcl; and Wellbutrin [bupropion]   Review of Systems Review of Systems  Constitutional: Negative for chills and fever.  Musculoskeletal: Positive for arthralgias (Right shoulder pain). Negative for joint swelling and neck pain.  Skin: Negative for color change and wound.  Neurological: Negative for dizziness, weakness, numbness and headaches.  All other systems reviewed and are negative.    Physical Exam Updated Vital Signs BP 123/83 (BP Location: Left Arm)   Pulse 96   Temp 98.7 F (37.1 C) (Oral)   Resp 14   Ht 5\' 8"  (1.727 m)   Wt 83.9 kg (185 lb)   SpO2 97%   BMI 28.13 kg/m   Physical Exam  Constitutional: He is oriented to person, place, and time. He appears well-developed and well-nourished. No distress.  HENT:  Head: Atraumatic.  Neck: Full passive range of motion without pain. Neck supple. No spinous process tenderness and no muscular tenderness present. Normal range of motion present.  Cardiovascular: Normal rate, regular rhythm and intact distal pulses.  Pulmonary/Chest: Effort normal and breath sounds normal. No respiratory distress. He exhibits  no tenderness.  Musculoskeletal: He exhibits tenderness. He exhibits no edema or deformity.       Right shoulder: He exhibits decreased range of motion and tenderness. He exhibits no swelling, no effusion, no crepitus, normal pulse and normal strength.       Arms: ttp over the right AC joint.  Pain reproduced with abduction.  Range of motion is limited secondary to pain.  Grip strength is strong and symmetrical bilaterally.    Neurological: He is alert and oriented to person, place, and time. No sensory deficit.  Skin: Skin is warm. Capillary refill takes less than 2 seconds. No rash noted.  Nursing note and vitals  reviewed.    ED Treatments / Results  Labs (all labs ordered are listed, but only abnormal results are displayed) Labs Reviewed - No data to display  EKG None  Radiology Dg Shoulder Right  Result Date: 03/30/2018 CLINICAL DATA:  Chronic right shoulder pain after fall. EXAM: RIGHT SHOULDER - 2+ VIEW COMPARISON:  None. FINDINGS: There is no evidence of fracture or dislocation. There is no evidence of arthropathy or other focal bone abnormality. Soft tissues are unremarkable. IMPRESSION: Normal right shoulder. Electronically Signed   By: Marijo Conception, M.D.   On: 03/30/2018 21:48    Procedures Procedures (including critical care time)  Medications Ordered in ED Medications - No data to display   Initial Impression / Assessment and Plan / ED Course  I have reviewed the triage vital signs and the nursing notes.  Pertinent labs & imaging results that were available during my care of the patient were reviewed by me and considered in my medical decision making (see chart for details).     Patient with chronic pain of the right shoulder.  No concerning symptoms for septic joint.  Remains neurovascularly intact.  No cervical tenderness.   I have discussed the possibility of a rotator cuff or labral issue.  Patient verbalized understanding, he agrees to orthopedic follow-up.  Prescription for NSAID  Final Clinical Impressions(s) / ED Diagnoses   Final diagnoses:  Chronic right shoulder pain    ED Discharge Orders    None       Kem Parkinson, PA-C 03/30/18 2314    Fredia Sorrow, MD 04/02/18 [phone]

## 2018-03-30 NOTE — Discharge Instructions (Addendum)
Continue to apply ice packs on and off to your shoulder.  You may alternate with heat.  Call one of the orthopedic providers listed to arrange a follow-up appointment.

## 2018-03-30 NOTE — ED Notes (Signed)
Ice Pack applied to right shoulder

## 2018-03-30 NOTE — ED Notes (Signed)
Patient transported to X-ray 

## 2018-04-02 ENCOUNTER — Ambulatory Visit (INDEPENDENT_AMBULATORY_CARE_PROVIDER_SITE_OTHER): Payer: Managed Care, Other (non HMO) | Admitting: Orthopaedic Surgery

## 2018-04-02 ENCOUNTER — Encounter: Payer: Self-pay | Admitting: Orthopaedic Surgery

## 2018-04-02 VITALS — BP 113/77 | HR 85 | Temp 97.6°F | Ht 68.0 in | Wt 188.0 lb

## 2018-04-02 DIAGNOSIS — G8929 Other chronic pain: Secondary | ICD-10-CM | POA: Diagnosis not present

## 2018-04-02 DIAGNOSIS — M25511 Pain in right shoulder: Secondary | ICD-10-CM

## 2018-04-02 NOTE — Progress Notes (Signed)
Subjective:    Patient ID: Andrew Mode Sr., male    DOB: 01/20/1976, 42 y.o.   MRN: [id number]  HPI He hurt his right shoulder in January of this year.  He thought it would get better.  However, it has become progressively worse and more painful.  He has pain trying to lift his hand over his head.  He has no numbness, no swelling, no redness.  Advil helps some as does heat or ice.  He has pain rolling over on the shoulder at night.  He has difficulty in putting his belt on.  He is tired of hurting.  He went to the ER on 03-30-18.  X-rays were negative.  He was referred here.  I have reviewed the ER records, x-rays and x-ray report.   Review of Systems  Respiratory: Negative for cough and shortness of breath.   Cardiovascular: Negative for chest pain and leg swelling.  Endocrine: Negative for cold intolerance.  Musculoskeletal: Positive for arthralgias.  Allergic/Immunologic: Negative for environmental allergies.   Past Medical History:  Diagnosis Date  . Hypoglycemia     Past Surgical History:  Procedure Laterality Date  . APPENDECTOMY    . HERNIA REPAIR      Current Outpatient Medications on File Prior to Visit  Medication Sig Dispense Refill  . ranitidine (ZANTAC) 300 MG tablet Take 300 mg by mouth at bedtime.    . diclofenac (VOLTAREN) 75 MG EC tablet Take 1 tablet (75 mg total) by mouth 2 (two) times daily. Take with food (Patient not taking: Reported on 04/02/2018) 14 tablet 0   No current facility-administered medications on file prior to visit.     Social History   Socioeconomic History  . Marital status: Married    Spouse name: Not on file  . Number of children: Not on file  . Years of education: Not on file  . Highest education level: Not on file  Occupational History  . Not on file  Social Needs  . Financial resource strain: Not on file  . Food insecurity:    Worry: Not on file    Inability: Not on file  . Transportation needs:    Medical: Not on  file    Non-medical: Not on file  Tobacco Use  . Smoking status: Current Every Day Smoker    Packs/day: 1.00    Types: Cigarettes    Start date: 11/04/1988  . Smokeless tobacco: Former Systems developer    Quit date: 11/13/2012  Substance and Sexual Activity  . Alcohol use: No  . Drug use: No  . Sexual activity: Not on file  Lifestyle  . Physical activity:    Days per week: Not on file    Minutes per session: Not on file  . Stress: Not on file  Relationships  . Social connections:    Talks on phone: Not on file    Gets together: Not on file    Attends religious service: Not on file    Active member of club or organization: Not on file    Attends meetings of clubs or organizations: Not on file    Relationship status: Not on file  . Intimate partner violence:    Fear of current or ex partner: Not on file    Emotionally abused: Not on file    Physically abused: Not on file    Forced sexual activity: Not on file  Other Topics Concern  . Not on file  Social History Narrative  .  Not on file    Family History  Problem Relation Age of Onset  . Diabetes Maternal Grandfather   . Heart attack Maternal Grandfather   . Congestive Heart Failure Maternal Grandfather   . Heart attack Paternal Grandfather   . Diabetes Mother   . Mental illness Mother   . Diabetes Father   . Heart attack Father   . Mental illness Father   . Diabetes Sister   . Mental illness Brother   . Diabetes Maternal Grandmother   . Stroke Maternal Grandmother   . Heart attack Paternal Grandmother     BP 113/77   Pulse 85   Temp 97.6 F (36.4 C)   Ht 5\' 8"  (1.727 m)   Wt 188 lb (85.3 kg)   BMI 28.59 kg/m       Objective:   Physical Exam  Constitutional: He is oriented to person, place, and time. He appears well-developed and well-nourished.  HENT:  Head: Normocephalic and atraumatic.  Eyes: Pupils are equal, round, and reactive to light. Conjunctivae and EOM are normal.  Neck: Normal range of motion. Neck  supple.  Cardiovascular: Normal rate, regular rhythm and intact distal pulses.  Pulmonary/Chest: Effort normal.  Abdominal: Soft.  Musculoskeletal:       Right shoulder: He exhibits decreased range of motion, tenderness and pain.       Arms: Neurological: He is alert and oriented to person, place, and time. He has normal reflexes. He displays normal reflexes. No cranial nerve deficit. He exhibits normal muscle tone. Coordination normal.  Skin: Skin is warm and dry.  Psychiatric: He has a normal mood and affect. His behavior is normal. Judgment and thought content normal.          Assessment & Plan:   Encounter Diagnosis  Name Primary?  . Chronic right shoulder pain Yes   I will keep him out of work  I feel he has a rotator cuff tear.  He needs a MRI but his insurance requires a six week wait after conservative treatment.  He declines injection.  Return in three weeks.  Call if any problem.  Precautions discussed.   Electronically Signed Sanjuana Kava, MD 5/30/20199:44 AM

## 2018-04-02 NOTE — Patient Instructions (Addendum)
Steps to Quit Smoking Smoking tobacco can be bad for your health. It can also affect almost every organ in your body. Smoking puts you and people around you at risk for many serious long-lasting (chronic) diseases. Quitting smoking is hard, but it is one of the best things that you can do for your health. It is never too late to quit. What are the benefits of quitting smoking? When you quit smoking, you lower your risk for getting serious diseases and conditions. They can include:  Lung cancer or lung disease.  Heart disease.  Stroke.  Heart attack.  Not being able to have children (infertility).  Weak bones (osteoporosis) and broken bones (fractures).  If you have coughing, wheezing, and shortness of breath, those symptoms may get better when you quit. You may also get sick less often. If you are pregnant, quitting smoking can help to lower your chances of having a baby of low birth weight. What can I do to help me quit smoking? Talk with your doctor about what can help you quit smoking. Some things you can do (strategies) include:  Quitting smoking totally, instead of slowly cutting back how much you smoke over a period of time.  Going to in-person counseling. You are more likely to quit if you go to many counseling sessions.  Using resources and support systems, such as: ? Online chats with a counselor. ? Phone quitlines. ? Printed self-help materials. ? Support groups or group counseling. ? Text messaging programs. ? Mobile phone apps or applications.  Taking medicines. Some of these medicines may have nicotine in them. If you are pregnant or breastfeeding, do not take any medicines to quit smoking unless your doctor says it is okay. Talk with your doctor about counseling or other things that can help you.  Talk with your doctor about using more than one strategy at the same time, such as taking medicines while you are also going to in-person counseling. This can help make  quitting easier. What things can I do to make it easier to quit? Quitting smoking might feel very hard at first, but there is a lot that you can do to make it easier. Take these steps:  Talk to your family and friends. Ask them to support and encourage you.  Call phone quitlines, reach out to support groups, or work with a counselor.  Ask people who smoke to not smoke around you.  Avoid places that make you want (trigger) to smoke, such as: ? Bars. ? Parties. ? Smoke-break areas at work.  Spend time with people who do not smoke.  Lower the stress in your life. Stress can make you want to smoke. Try these things to help your stress: ? Getting regular exercise. ? Deep-breathing exercises. ? Yoga. ? Meditating. ? Doing a body scan. To do this, close your eyes, focus on one area of your body at a time from head to toe, and notice which parts of your body are tense. Try to relax the muscles in those areas.  Download or buy apps on your mobile phone or tablet that can help you stick to your quit plan. There are many free apps, such as QuitGuide from the CDC (Centers for Disease Control and Prevention). You can find more support from smokefree.gov and other websites.  This information is not intended to replace advice given to you by your health care provider. Make sure you discuss any questions you have with your health care provider. Document Released: 08/17/2009 Document   Revised: 06/18/2016 Document Reviewed: 03/07/2015 Elsevier Interactive Patient Education  2018 [place]

## 2018-04-23 ENCOUNTER — Encounter: Payer: Self-pay | Admitting: Orthopaedic Surgery

## 2018-04-23 ENCOUNTER — Ambulatory Visit (INDEPENDENT_AMBULATORY_CARE_PROVIDER_SITE_OTHER): Payer: Managed Care, Other (non HMO) | Admitting: Orthopaedic Surgery

## 2018-04-23 VITALS — BP 115/81 | HR 93 | Ht 68.0 in | Wt 190.0 lb

## 2018-04-23 DIAGNOSIS — M25511 Pain in right shoulder: Secondary | ICD-10-CM

## 2018-04-23 DIAGNOSIS — G8929 Other chronic pain: Secondary | ICD-10-CM

## 2018-04-23 NOTE — Patient Instructions (Signed)
  Physical therapy has been ordered for you at [facility] [phone] is the phone number to call if you want to call to schedule. Please let us know if you do not hear anything within one week.  

## 2018-04-23 NOTE — Progress Notes (Signed)
Patient QA:STMHD Andrew Gauze Sr., male DOB:1976-02-11, 42 y.o. QQI:[id number]  Chief Complaint  Patient presents with  . Follow-up    Right shoulder    HPI  Veron Senner Sr. is a 42 y.o. male who has continued pain of the shoulder on the right.  He is no better.  He has pain with overhead use.  He is not sleeping well.  He has to wait for MRI secondary to insurance reasons.  I will sent to OT. HPI  Body mass index is 28.89 kg/m.  ROS  Review of Systems  Respiratory: Negative for cough and shortness of breath.   Cardiovascular: Negative for chest pain and leg swelling.  Endocrine: Negative for cold intolerance.  Musculoskeletal: Positive for arthralgias.  Allergic/Immunologic: Negative for environmental allergies.    Past Medical History:  Diagnosis Date  . Hypoglycemia     Past Surgical History:  Procedure Laterality Date  . APPENDECTOMY    . HERNIA REPAIR      Family History  Problem Relation Age of Onset  . Diabetes Maternal Grandfather   . Heart attack Maternal Grandfather   . Congestive Heart Failure Maternal Grandfather   . Heart attack Paternal Grandfather   . Diabetes Mother   . Mental illness Mother   . Diabetes Father   . Heart attack Father   . Mental illness Father   . Diabetes Sister   . Mental illness Brother   . Diabetes Maternal Grandmother   . Stroke Maternal Grandmother   . Heart attack Paternal Grandmother     Social History Social History   Tobacco Use  . Smoking status: Current Every Day Smoker    Packs/day: 1.00    Types: Cigarettes    Start date: 11/04/1988  . Smokeless tobacco: Former Systems developer    Quit date: 11/13/2012  Substance Use Topics  . Alcohol use: No  . Drug use: No    Allergies  Allergen Reactions  . Penicillins Anaphylaxis and Hives    Has patient had a PCN reaction causing immediate rash, facial/tongue/throat swelling, SOB or lightheadedness with hypotension: yes Has patient had a PCN reaction causing severe rash  involving mucus membranes or skin necrosis: yes Has patient had a PCN reaction that required hospitalization: unknown Has patient had a PCN reaction occurring within the last 10 years: No If all of the above answers are "NO", then may proceed with Cephalosporin use.   Normajean Glasgow Lorretta Harp Bromm] Other (See Comments)    Child hood allergy  . Promethazine Hcl Other (See Comments)    paranoid  . Wellbutrin [Bupropion] Anxiety    Current Outpatient Medications  Medication Sig Dispense Refill  . diclofenac (VOLTAREN) 75 MG EC tablet Take 1 tablet (75 mg total) by mouth 2 (two) times daily. Take with food (Patient not taking: Reported on 04/02/2018) 14 tablet 0  . ranitidine (ZANTAC) 300 MG tablet Take 300 mg by mouth at bedtime.     No current facility-administered medications for this visit.      Physical Exam  Blood pressure 115/81, pulse 93, height 5\' 8"  (1.727 m), weight 190 lb (86.2 kg).  Constitutional: overall normal hygiene, normal nutrition, well developed, normal grooming, normal body habitus. Assistive device:none  Musculoskeletal: gait and station Limp none, muscle tone and strength are normal, no tremors or atrophy is present.  .  Neurological: coordination overall normal.  Deep tendon reflex/nerve stretch intact.  Sensation normal.  Cranial nerves II-XII intact.   Skin:   Normal overall no  scars, lesions, ulcers or rashes. No psoriasis.  Psychiatric: Alert and oriented x 3.  Recent memory intact, remote memory unclear.  Normal mood and affect. Well groomed.  Good eye contact.  Cardiovascular: overall no swelling, no varicosities, no edema bilaterally, normal temperatures of the legs and arms, no clubbing, cyanosis and good capillary refill.  Lymphatic: palpation is normal.  Examination of right Upper Extremity is done.  Inspection:   Overall:  Elbow non-tender without crepitus or defects, forearm non-tender without crepitus or defects, wrist non-tender without  crepitus or defects, hand non-tender.    Shoulder: with glenohumeral joint tenderness, without effusion.   Upper arm: without swelling and tenderness   Range of motion:   Overall:  Full range of motion of the elbow, full range of motion of wrist and full range of motion in fingers.   Shoulder:  right  120 degrees forward flexion; 85 degrees abduction; 30 degrees internal rotation, 30 degrees external rotation, 10 degrees extension, 40 degrees adduction.   Stability:   Overall:  Shoulder, elbow and wrist stable   Strength and Tone:   Overall full shoulder muscles strength, full upper arm strength and normal upper arm bulk and tone.  All other systems reviewed and are negative   The patient has been educated about the nature of the problem(s) and counseled on treatment options.  The patient appeared to understand what I have discussed and is in agreement with it.  Encounter Diagnosis  Name Primary?  . Chronic right shoulder pain Yes    PLAN Call if any problems.  Precautions discussed.  Continue current medications.   Return to clinic 3 weeks   Begin OT.Obtain MRI if not better on next visit.  Out of work.  Electronically Signed Sanjuana Kava, MD 6/20/20199:24 AM

## 2018-05-06 ENCOUNTER — Other Ambulatory Visit: Payer: Self-pay

## 2018-05-06 ENCOUNTER — Encounter (HOSPITAL_COMMUNITY): Payer: Self-pay

## 2018-05-06 ENCOUNTER — Ambulatory Visit (HOSPITAL_COMMUNITY): Payer: Managed Care, Other (non HMO) | Attending: Orthopaedic Surgery

## 2018-05-06 DIAGNOSIS — R29898 Other symptoms and signs involving the musculoskeletal system: Secondary | ICD-10-CM | POA: Diagnosis present

## 2018-05-06 DIAGNOSIS — G8929 Other chronic pain: Secondary | ICD-10-CM | POA: Diagnosis present

## 2018-05-06 DIAGNOSIS — M25611 Stiffness of right shoulder, not elsewhere classified: Secondary | ICD-10-CM | POA: Diagnosis present

## 2018-05-06 DIAGNOSIS — M25511 Pain in right shoulder: Secondary | ICD-10-CM | POA: Diagnosis present

## 2018-05-06 NOTE — Patient Instructions (Signed)
Complete the following exercises 2-3 times a day.  Doorway Stretch  Place each hand opposite each other on the doorway. (You can change where you feel the stretch by moving arms higher or lower.) Step through with one foot and bend front knee until a stretch is felt and hold. Step through with the opposite foot on the next rep. Hold for __10-15___ seconds. Repeat __2__times.     Scapular Retraction (Standing)   With arms at sides, pinch shoulder blades together. Repeat __10__ times per set. Do __1__ sets per session. Do __2__ sessions per day.  http://orth.exer.us/944   Copyright  VHI. All rights reserved.    Posterior Capsule Stretch   Stand or sit, one arm across body so hand rests over opposite shoulder. Gently push on crossed elbow with other hand until stretch is felt in shoulder of crossed arm. Hold _10-15__ seconds.  Repeat _2__ times per session. Do ___ sessions per day.   Wall Flexion  Slide your arm up the wall or door frame until a stretch is felt in your shoulder . Hold for 10-15 seconds. Complete 2 times     Shoulder Abduction Stretch  Stand side ways by a wall with affected up on wall. Gently step in toward wall to feel stretch. Hold for 10-15 seconds. Complete 2 times.

## 2018-05-06 NOTE — Therapy (Addendum)
[facility] [place], [place], [address] Phone: [phone]   Fax:  [phone]  Occupational Therapy Evaluation  Patient Details  Name: Andrew Gillespie. MRN: [id number] Date of Birth: 1976-03-27 Referring Provider: Dr. Sanjuana Kava   Encounter Date: 05/06/2018  OT End of Session - 05/06/18 1129    Visit Number  1    Number of Visits  4    Date for OT Re-Evaluation  06/03/18    Authorization Type  [employer]. $25 copay. 128 visits for PT and OT combined. 0 used this year. No authorization required.    Authorization - Visit Number  1    Authorization - Number of Visits  128    OT Start Time  [phone]    OT Stop Time  0935    OT Time Calculation (min)  38 min    Activity Tolerance  Patient tolerated treatment well    Behavior During Therapy  WFL for tasks assessed/performed       Past Medical History:  Diagnosis Date  . Hypoglycemia     Past Surgical History:  Procedure Laterality Date  . APPENDECTOMY    . HERNIA REPAIR      There were no vitals filed for this visit.  Subjective Assessment - 05/06/18 0858    Subjective   S: When I move it a certain way or push against something it hurts, but I just push through it.    Patient is accompained by:  Family member daughter    Pertinent History  Patient is a 42 y/o male presenting with chronic right shoulder pain. Patient reports the pain began around January and has progressively worsened since then. He reports he does not know what caused the pain initially. Patient reports trouble sleeping and pain with overhead reaching, pushing objects, and throwing the ball with his grandson. Patient completes all ADLs and leisure tasks but reports high pain levels.    Special Tests  FOTO: 46/100    Patient Stated Goals  To be able to throw the ball with his kids and play with his grandson. He also wants to be able to go fishing again without pain.    Currently in Pain?  Yes     Pain Score  6  pain of up to 9/10 with movement    Pain Location  Shoulder    Pain Orientation  Right    Pain Descriptors / Indicators  Aching    Pain Type  Chronic pain    Pain Radiating Towards  N/A    Pain Onset  More than a month ago    Pain Frequency  Constant    Aggravating Factors   certain movements, pushing against objects    Pain Relieving Factors  heat    Effect of Pain on Daily Activities  moderate effect on ADLs    Multiple Pain Sites  No        [facility] OT Assessment - 05/06/18 0850      Assessment   Medical Diagnosis  right shoulder pain    Referring Provider  Dr. Sanjuana Kava    Onset Date/Surgical Date  -- approximately this January    Hand Dominance  Right    Next MD Visit  05/14/18    Prior Therapy  None      Precautions   Precautions  None      Restrictions   Weight Bearing Restrictions  No      Balance Screen  Has the patient fallen in the past 6 months  Yes    How many times?  1    Has the patient had a decrease in activity level because of a fear of falling?   No    Is the patient reluctant to leave their home because of a fear of falling?   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 Patient wishes to be able to continue fishing.      ADL   ADL comments  Patient reports difficulty getting things out of the cabinet, picking up his grandson, reaching back. Patient is able to complete all ADLs but experiences moderate pain that he just pushes through. He has pain with overhead reaching including upper body dressing.      Mobility   Mobility Status  Independent      Vision - History    Baseline Vision  No visual deficits      Cognition   Overall Cognitive Status  Within Functional Limits for tasks assessed      Observation/Other Assessments   Focus on Therapeutic Outcomes (FOTO)   46/100      ROM / Strength   AROM / PROM / Strength  AROM;PROM;Strength      Palpation   Palpation comment  moderate fascial restrictions in upper arm, trapezius, and scapularis regions      AROM   Overall AROM Comments  assessed seated; IR/er adducted    AROM Assessment Site  Shoulder    Right/Left Shoulder  Right    Right Shoulder Flexion  135 Degrees    Right Shoulder ABduction  128 Degrees    Right Shoulder Internal Rotation  90 Degrees    Right Shoulder External Rotation  62 Degrees      PROM   Overall PROM Comments  assessed supine; IR/er adducted    PROM Assessment Site  Shoulder    Right/Left Shoulder  Right    Right Shoulder Flexion  165 Degrees    Right Shoulder ABduction  160 Degrees    Right Shoulder Internal Rotation  90 Degrees    Right Shoulder External Rotation  72 Degrees      Strength   Overall Strength Comments  assessed seated; IR/er adducted    Strength Assessment Site  Shoulder    Right/Left Shoulder  Right    Right Shoulder Flexion  4/5    Right Shoulder Extension  4/5    Right Shoulder ABduction  4-/5    Right Shoulder Internal Rotation  3+/5    Right Shoulder External Rotation  4/5               OT Treatments/Exercises (OP) - 05/06/18 0851      Exercises   Exercises  Shoulder            OT Education - 05/06/18 1128    Education Details  Patient given shoulder stretches to complete as part of HEP.    Person(s) Educated  Patient;Child(ren)    Methods  Explanation;Demonstration;Verbal cues;Handout    Comprehension  Verbalized understanding;Returned demonstration;Verbal cues required       OT Short Term Goals - 05/06/18 1142      OT SHORT TERM GOAL #1   Title  Patient will be educated and independent with HEP to faciliate  progress in therapy and allow him to return to using his RUE as his dominant extremity for all daily and leisure tasks.      Time  4    Period  Weeks    Status  New    Target Date  06/03/18      OT SHORT TERM GOAL #2   Title  Patient will increase RUE strength to 5/5 in order to complete daily tasks and be able to lift his grandson.    Time  4    Period  Weeks    Status  New      OT SHORT TERM GOAL #3   Title  Patient will increase RUE A/ROM to [facility] to increase ability to complete overhead reaching tasks with less difficulty.    Time  4    Period  Weeks    Status  New      OT SHORT TERM GOAL #4   Title  Patient will decrease pain level in RUE during leisure and daily tasks to a 6/10 or less.    Time  4    Period  Weeks    Status  New      OT SHORT TERM GOAL #5   Title  Patient will decrease fascial restrictions in RUE to min amount or less in order to increase functional mobility needed to complete functional reaching tasks.      Time  4    Period  Weeks    Status  New               Plan - 05/06/18 1134    Clinical Impression Statement  A: Patient is a 42 y/o male presenting with gradually worsening chronic right shoulder pain with an unknown cause. Pain is causing increased fascial restrictions, decreased strength, and limited ROM resulting in difficulty completing daily and leisure tasks with the dominant right upper extremity.  Patient to come in for skilled OT services 1X a week for 4 weeks due to high copay. Therapist originally recommending 2X a week for 4 weeks.    Occupational Profile and client history currently impacting functional performance  Patient is motivated to return to prior level of function and was independent prior to injury. Patient is motivated to be able to play with children and grandchildren like he used to.    Occupational performance deficits (Please refer to evaluation for details):  ADL's;IADL's;Rest and Sleep;Leisure    Rehab Potential  Good     Current Impairments/barriers affecting progress:  chronic worsening pain    OT Frequency  1x / week    OT Duration  4 weeks    OT Treatment/Interventions  Self-care/ADL training;Moist Heat;Therapeutic activities;Ultrasound;Therapeutic exercise;Cryotherapy;Neuromuscular education;Passive range of motion;Electrical Stimulation;Paraffin;Energy conservation;Manual Therapy;Patient/family education    Plan  P: Patient will benefit from skilled OT services to increase functional performance during daily and leisure tasks. Treatment Plan: Myofascial release, manual stretching, P/ROM, AA/ROM, A/ROM, general shoulder and scapular strengthening.     Clinical Decision Making  Limited treatment options, no task modification necessary    OT Home Exercise Plan  shoulder stretches    Consulted and Agree with Plan of Care  Patient;Family member/caregiver    Family Member Consulted  daughter  Patient will benefit from skilled therapeutic intervention in order to improve the following deficits and impairments:  Decreased range of motion, Decreased endurance, Decreased activity tolerance, Increased fascial restrictions, Impaired UE functional use, Pain, Decreased mobility, Decreased strength  Visit Diagnosis: Chronic right shoulder pain - Plan: Ot plan of care cert/re-cert  Stiffness of right shoulder, not elsewhere classified - Plan: Ot plan of care cert/re-cert  Other symptoms and signs involving the musculoskeletal system - Plan: Ot plan of care cert/re-cert    Problem List Patient Active Problem List   Diagnosis Date Noted  . Fatigue 07/05/2015  . Insomnia 07/05/2015  . Depression 07/05/2015  . Esophageal reflux 02/16/2013    Roderic Palau, OT student 05/06/2018, 11:49 AM  [facility] [address] [place], [place], [address] Phone: [phone]   Fax:  [phone]  Name: Garo Heidelberg Gillespie. MRN: [id number] Date of Birth: Jul 31, 1976

## 2018-05-14 ENCOUNTER — Encounter: Payer: Self-pay | Admitting: Orthopaedic Surgery

## 2018-05-14 ENCOUNTER — Ambulatory Visit (HOSPITAL_COMMUNITY): Payer: Managed Care, Other (non HMO)

## 2018-05-14 ENCOUNTER — Telehealth (HOSPITAL_COMMUNITY): Payer: Self-pay

## 2018-05-14 ENCOUNTER — Telehealth: Payer: Self-pay | Admitting: Radiology

## 2018-05-14 ENCOUNTER — Ambulatory Visit (INDEPENDENT_AMBULATORY_CARE_PROVIDER_SITE_OTHER): Payer: Managed Care, Other (non HMO) | Admitting: Orthopaedic Surgery

## 2018-05-14 DIAGNOSIS — M25511 Pain in right shoulder: Secondary | ICD-10-CM

## 2018-05-14 DIAGNOSIS — G8929 Other chronic pain: Secondary | ICD-10-CM | POA: Diagnosis not present

## 2018-05-14 NOTE — Telephone Encounter (Signed)
Called patient regarding no show. Left message on voicemail reminding patient of his next appointment and also to let us know if he is unable to make it or would also like to reschedule the appointment that he missed today.   Ailene Ravel, OTR/L,CBIS  [phone]

## 2018-05-14 NOTE — Patient Instructions (Signed)
Steps to Quit Smoking Smoking tobacco can be bad for your health. It can also affect almost every organ in your body. Smoking puts you and people around you at risk for many serious long-lasting (chronic) diseases. Quitting smoking is hard, but it is one of the best things that you can do for your health. It is never too late to quit. What are the benefits of quitting smoking? When you quit smoking, you lower your risk for getting serious diseases and conditions. They can include:  Lung cancer or lung disease.  Heart disease.  Stroke.  Heart attack.  Not being able to have children (infertility).  Weak bones (osteoporosis) and broken bones (fractures).  If you have coughing, wheezing, and shortness of breath, those symptoms may get better when you quit. You may also get sick less often. If you are pregnant, quitting smoking can help to lower your chances of having a baby of low birth weight. What can I do to help me quit smoking? Talk with your doctor about what can help you quit smoking. Some things you can do (strategies) include:  Quitting smoking totally, instead of slowly cutting back how much you smoke over a period of time.  Going to in-person counseling. You are more likely to quit if you go to many counseling sessions.  Using resources and support systems, such as: ? Online chats with a counselor. ? Phone quitlines. ? Printed self-help materials. ? Support groups or group counseling. ? Text messaging programs. ? Mobile phone apps or applications.  Taking medicines. Some of these medicines may have nicotine in them. If you are pregnant or breastfeeding, do not take any medicines to quit smoking unless your doctor says it is okay. Talk with your doctor about counseling or other things that can help you.  Talk with your doctor about using more than one strategy at the same time, such as taking medicines while you are also going to in-person counseling. This can help make  quitting easier. What things can I do to make it easier to quit? Quitting smoking might feel very hard at first, but there is a lot that you can do to make it easier. Take these steps:  Talk to your family and friends. Ask them to support and encourage you.  Call phone quitlines, reach out to support groups, or work with a counselor.  Ask people who smoke to not smoke around you.  Avoid places that make you want (trigger) to smoke, such as: ? Bars. ? Parties. ? Smoke-break areas at work.  Spend time with people who do not smoke.  Lower the stress in your life. Stress can make you want to smoke. Try these things to help your stress: ? Getting regular exercise. ? Deep-breathing exercises. ? Yoga. ? Meditating. ? Doing a body scan. To do this, close your eyes, focus on one area of your body at a time from head to toe, and notice which parts of your body are tense. Try to relax the muscles in those areas.  Download or buy apps on your mobile phone or tablet that can help you stick to your quit plan. There are many free apps, such as QuitGuide from the CDC (Centers for Disease Control and Prevention). You can find more support from smokefree.gov and other websites.  This information is not intended to replace advice given to you by your health care provider. Make sure you discuss any questions you have with your health care provider. Document Released: 08/17/2009 Document   Revised: 06/18/2016 Document Reviewed: 03/07/2015 Elsevier Interactive Patient Education  2018 Elsevier Inc.  

## 2018-05-14 NOTE — Telephone Encounter (Signed)
Advised patient MRI approved by insurance, he can call to schedule appt with [facility], [phone] when he gets appt he will call here to make a follow up with Dr Luna Glasgow

## 2018-05-14 NOTE — Progress Notes (Signed)
Patient WE:[id number] Andrew Gauze Sr., male DOB:[birth year], 42 y.o. IRC:[id number]  Chief Complaint  Patient presents with  . Shoulder Pain    right     HPI  Andrew Baize Sr. is a 42 y.o. male who continues to have pain in the right shoulder.  He has been to OT.  His motion is better but his pain is worse. I will get a MRI of the right shoulder.   Body mass index is 29.35 kg/m.  ROS  Review of Systems  Respiratory: Negative for cough and shortness of breath.   Cardiovascular: Negative for chest pain and leg swelling.  Endocrine: Negative for cold intolerance.  Musculoskeletal: Positive for arthralgias.  Allergic/Immunologic: Negative for environmental allergies.    All other systems reviewed and are negative.  Past Medical History:  Diagnosis Date  . Hypoglycemia     Past Surgical History:  Procedure Laterality Date  . APPENDECTOMY    . HERNIA REPAIR      Family History  Problem Relation Age of Onset  . Diabetes Maternal Grandfather   . Heart attack Maternal Grandfather   . Congestive Heart Failure Maternal Grandfather   . Heart attack Paternal Grandfather   . Diabetes Mother   . Mental illness Mother   . Diabetes Father   . Heart attack Father   . Mental illness Father   . Diabetes Sister   . Mental illness Brother   . Diabetes Maternal Grandmother   . Stroke Maternal Grandmother   . Heart attack Paternal Grandmother     Social History Social History   Tobacco Use  . Smoking status: Current Every Day Smoker    Packs/day: 1.00    Types: Cigarettes    Start date: 11/04/1988  . Smokeless tobacco: Former Systems developer    Quit date: 11/13/2012  Substance Use Topics  . Alcohol use: No  . Drug use: No    Allergies  Allergen Reactions  . Penicillins Anaphylaxis and Hives    Has patient had a PCN reaction causing immediate rash, facial/tongue/throat swelling, SOB or lightheadedness with hypotension: yes Has patient had a PCN reaction causing severe rash involving  mucus membranes or skin necrosis: yes Has patient had a PCN reaction that required hospitalization: unknown Has patient had a PCN reaction occurring within the last 10 years: No If all of the above answers are "NO", then may proceed with Cephalosporin use.   Andrew Gillespie] Other (See Comments)    Child hood allergy  . Promethazine Hcl Other (See Comments)    paranoid  . Wellbutrin [Bupropion] Anxiety    Current Outpatient Medications  Medication Sig Dispense Refill  . ranitidine (ZANTAC) 300 MG tablet Take 300 mg by mouth at bedtime.    . diclofenac (VOLTAREN) 75 MG EC tablet Take 1 tablet (75 mg total) by mouth 2 (two) times daily. Take with food (Patient not taking: Reported on 04/02/2018) 14 tablet 0   No current facility-administered medications for this visit.      Physical Exam  Blood pressure 119/82, pulse 86, temperature 98.4 F (36.9 C), height 5\' 8"  (1.727 m), weight 193 lb (87.5 kg).  Constitutional: overall normal hygiene, normal nutrition, well developed, normal grooming, normal body habitus. Assistive device:none  Musculoskeletal: gait and station Limp none, muscle tone and strength are normal, no tremors or atrophy is present.  .  Neurological: coordination overall normal.  Deep tendon reflex/nerve stretch intact.  Sensation normal.  Cranial nerves II-XII intact.   Skin:  Normal overall no scars, lesions, ulcers or rashes. No psoriasis.  Psychiatric: Alert and oriented x 3.  Recent memory intact, remote memory unclear.  Normal mood and affect. Well groomed.  Good eye contact.  Cardiovascular: overall no swelling, no varicosities, no edema bilaterally, normal temperatures of the legs and arms, no clubbing, cyanosis and good capillary refill.  Lymphatic: palpation is normal.  Right shoulder has pain, ROM is nearly full but very tender in the extremes.  NV intact.  All other systems reviewed and are negative   The patient has been educated  about the nature of the problem(s) and counseled on treatment options.  The patient appeared to understand what I have discussed and is in agreement with it.  Encounter Diagnosis  Name Primary?  . Chronic right shoulder pain     PLAN Call if any problems.  Precautions discussed.  Continue current medications.   Return to clinic After MRI of the right shoulder   Electronically Signed Sanjuana Kava, MD 7/11/201910:32 AM

## 2018-05-21 ENCOUNTER — Ambulatory Visit (HOSPITAL_COMMUNITY): Payer: Managed Care, Other (non HMO)

## 2018-05-21 ENCOUNTER — Other Ambulatory Visit: Payer: Self-pay

## 2018-05-21 ENCOUNTER — Encounter (HOSPITAL_COMMUNITY): Payer: Self-pay

## 2018-05-21 DIAGNOSIS — R29898 Other symptoms and signs involving the musculoskeletal system: Secondary | ICD-10-CM

## 2018-05-21 DIAGNOSIS — M25511 Pain in right shoulder: Principal | ICD-10-CM

## 2018-05-21 DIAGNOSIS — G8929 Other chronic pain: Secondary | ICD-10-CM

## 2018-05-21 DIAGNOSIS — M25611 Stiffness of right shoulder, not elsewhere classified: Secondary | ICD-10-CM

## 2018-05-21 NOTE — Patient Instructions (Signed)
Repeat all exercises 10-15 times, 1-2 times per day.   1) Shoulder Protraction    Begin with elbows by your side, slowly "punch" straight out in front of you.      2) Shoulder Flexion Standing:         Begin with arms at your side with thumbs pointed up, slowly raise both arms up and forward towards overhead.               3) Horizontal abduction/adduction   Standing:           Begin with arms straight out in front of you, bring out to the side in at "T" shape. Keep arms straight entire time.                 4) Internal & External Rotation    *No band* -Stand with elbows at the side and elbows bent 90 degrees. Move your forearms away from your body, then bring back inward toward the body.     5) Shoulder Abduction  Standing:       Lying on your back begin with your arms flat on the table next to your side. Slowly move your arms out to the side so that they go overhead, in a jumping jack or snow angel movement.   (Home) Extension: Isometric / Bilateral Arm Retraction - Sitting   Facing anchor, hold hands and elbow at shoulder height, with elbow bent.  Pull arms back to squeeze shoulder blades together. Repeat 10-15 times. 1-3 times/day.   (Clinic) Extension / Flexion (Assist)   Face anchor, pull arms back, keeping elbow straight, and squeze shoulder blades together. Repeat 10-15 times. 1-3 times/day.   Copyright  VHI. All rights reserved.   (Home) Retraction: Row - Bilateral (Anchor)   Facing anchor, arms reaching forward, pull hands toward stomach, keeping elbows bent and at your sides and pinching shoulder blades together. Repeat 10-15 times. 1-3 times/day.   Copyright  VHI. All rights reserved.

## 2018-05-21 NOTE — Therapy (Signed)
[place], [place], [address] Phone: [phone]   Fax:  [phone]  Occupational Therapy Treatment  Patient Details  Name: Andrew Courtwright Sr. MRN: [id number] Date of Birth: November 17, 1975 Referring Provider: Dr. Sanjuana Kava   Encounter Date: 05/21/2018  OT End of Session - 05/21/18 1159    Visit Number  2    Number of Visits  4    Date for OT Re-Evaluation  06/03/18    Authorization Type  [employer]. $25 copay. 128 visits for PT and OT combined. 0 used this year. No authorization required.    Authorization - Visit Number  2    Authorization - Number of Visits  128    OT Start Time  4709    OT Stop Time  1158    OT Time Calculation (min)  40 min    Activity Tolerance  Patient tolerated treatment well    Behavior During Therapy  WFL for tasks assessed/performed       Past Medical History:  Diagnosis Date  . Hypoglycemia     Past Surgical History:  Procedure Laterality Date  . APPENDECTOMY    . HERNIA REPAIR      There were no vitals filed for this visit.  Subjective Assessment - 05/21/18 1118    Subjective   S: It's not as bad as it's been.    Patient is accompained by:  -- daughter    Currently in Pain?  Yes    Pain Score  4     Pain Location  Shoulder    Pain Orientation  Right    Pain Descriptors / Indicators  Aching    Pain Type  Chronic pain    Pain Radiating Towards  N/A    Pain Onset  More than a month ago    Pain Frequency  Constant    Aggravating Factors   certain movements    Pain Relieving Factors  heat    Effect of Pain on Daily Activities  moderate effect on ADLs    Multiple Pain Sites  No         [facility] OT Assessment - 05/21/18 1119      Assessment   Medical Diagnosis  right shoulder pain      Precautions   Precautions  None               OT Treatments/Exercises (OP) - 05/21/18 1119      Exercises   Exercises  Shoulder      Shoulder Exercises: Supine    Protraction  PROM;5 reps;AROM;10 reps    Horizontal ABduction  PROM;5 reps;AROM;10 reps    External Rotation  PROM;5 reps;AROM;10 reps    Internal Rotation  PROM;5 reps;AROM;10 reps    Flexion  PROM;5 reps;AROM;10 reps    ABduction  PROM;5 reps;AROM;10 reps      Shoulder Exercises: Standing   Protraction  AROM;10 reps    Horizontal ABduction  AROM;10 reps    External Rotation  AROM;10 reps    Internal Rotation  AROM;10 reps    Flexion  AROM;10 reps    ABduction  AROM;10 reps    Extension  Theraband;15 reps    Theraband Level (Shoulder Extension)  Level 2 (Red)    Row  Theraband;15 reps    Theraband Level (Shoulder Row)  Level 2 (Red)    Retraction  Theraband;15 reps    Theraband Level (Shoulder Retraction)  Level 2 (Red)  Shoulder Exercises: ROM/Strengthening   X to V Arms  10X    Proximal Shoulder Strengthening, Supine  10X    Proximal Shoulder Strengthening, Seated  10X    Ball on Wall  1' flexion and 1' abduction      Manual Therapy   Manual Therapy  Myofascial release    Manual therapy comments  Performed seperate from all other interventions    Myofascial Release  Myofascial release performed to upper arm, trapezius, and scapularis regions to reduce fascial restriction, reduce pain, and increase ROM.             OT Education - 05/21/18 1144    Education Details  Reviewed evaluation and goals with patient. Patient is in agreement with all goals. Standing A/ROM and scapular theraband exercises added to HEP.    Person(s) Educated  Patient;Child(ren)    Methods  Explanation;Demonstration;Verbal cues;Handout    Comprehension  Verbalized understanding;Returned demonstration       OT Short Term Goals - 05/21/18 1200      OT SHORT TERM GOAL #1   Title  Patient will be educated and independent with HEP to faciliate progress in therapy and allow him to return to using his RUE as his dominant extremity for all daily and leisure tasks.      Time  4    Period   Weeks    Status  On-going      OT SHORT TERM GOAL #2   Title  Patient will increase RUE strength to 5/5 in order to complete daily tasks and be able to lift his grandson.    Time  4    Period  Weeks    Status  On-going      OT SHORT TERM GOAL #3   Title  Patient will increase RUE A/ROM to [facility] to increase ability to complete overhead reaching tasks with less difficulty.    Time  4    Period  Weeks    Status  On-going      OT SHORT TERM GOAL #4   Title  Patient will decrease pain level in RUE during leisure and daily tasks to a 6/10 or less.    Time  4    Period  Weeks    Status  On-going      OT SHORT TERM GOAL #5   Title  Patient will decrease fascial restrictions in RUE to min amount or less in order to increase functional mobility needed to complete functional reaching tasks.      Time  4    Period  Weeks    Status  On-going               Plan - 05/21/18 1253    Clinical Impression Statement  A: Patient demonstrating WNL P/ROM and WFL A/ROM this session. Patient reporting he will have an MRI to try to identify the cause of his shoulder pain this Saturday. Patient able to complete A/ROM in supine and standing demonstrating good form. Ball on the wall and proximal shoulder strengthening added. VC for patient to slow down. No rest breaks required.     Plan  P: Follow up with patient on MRI results. Progress to strengthening in supine and standing. Add UBE.    Consulted and Agree with Plan of Care  Patient;Family member/caregiver    Family Member Consulted  daughter       Patient will benefit from skilled therapeutic intervention in order to improve the following deficits and impairments:  Decreased range of motion, Decreased endurance, Decreased activity tolerance, Increased fascial restrictions, Impaired UE functional use, Pain, Decreased mobility, Decreased strength  Visit Diagnosis: Chronic right shoulder pain  Stiffness of right shoulder, not elsewhere  classified  Other symptoms and signs involving the musculoskeletal system    Problem List Patient Active Problem List   Diagnosis Date Noted  . Fatigue 07/05/2015  . Insomnia 07/05/2015  . Depression 07/05/2015  . Esophageal reflux 02/16/2013    Roderic Palau, OT student 05/21/2018, 12:57 PM  [place] [address] [place], [place], [address] Phone: [phone]   Fax:  [phone]  Name: Andrew Beller Sr. MRN: [id number] Date of Birth: November 28, 1975

## 2018-05-23 ENCOUNTER — Ambulatory Visit
Admission: RE | Admit: 2018-05-23 | Discharge: 2018-05-23 | Disposition: A | Discharge status: Home / Self Care | Payer: Managed Care, Other (non HMO) | Source: Ambulatory Visit | Attending: Orthopaedic Surgery | Admitting: Orthopaedic Surgery

## 2018-05-23 DIAGNOSIS — G8929 Other chronic pain: Secondary | ICD-10-CM

## 2018-05-23 DIAGNOSIS — M25511 Pain in right shoulder: Principal | ICD-10-CM

## 2018-05-27 ENCOUNTER — Encounter: Payer: Self-pay | Admitting: Orthopaedic Surgery

## 2018-05-27 ENCOUNTER — Ambulatory Visit (INDEPENDENT_AMBULATORY_CARE_PROVIDER_SITE_OTHER): Payer: Managed Care, Other (non HMO) | Admitting: Orthopaedic Surgery

## 2018-05-27 VITALS — BP 110/77 | HR 88 | Ht 68.0 in | Wt 194.0 lb

## 2018-05-27 DIAGNOSIS — M25511 Pain in right shoulder: Secondary | ICD-10-CM | POA: Diagnosis not present

## 2018-05-27 DIAGNOSIS — G8929 Other chronic pain: Secondary | ICD-10-CM | POA: Diagnosis not present

## 2018-05-27 NOTE — Progress Notes (Signed)
Patient Andrew [name], male DOB:1976-02-02, 42 y.o. YQI:[id number]  Chief Complaint  Patient presents with  . Shoulder Pain    Right.  MRI results    HPI  Andrew Klingensmith Sr. is a 42 y.o. male who has right shoulder pain. He had a MRI which showed: IMPRESSION: 1. Moderate rotator cuff tendinopathy/tendinosis with interstitial tears and areas of bursal and articular surface fraying and fibrillation. No discrete full-thickness retracted tear. 2. Intact long head biceps tendon and glenoid labrum. 3. Lateral downsloping of the acromion could contribute to bony impingement. No significant AC joint degenerative changes and the acromion is type 1 in shape. 4. There is thickening of the capsular structures in the axillary recess which can be seen with adhesive capsulitis or synovitis. 5. Moderate subacromial/subdeltoid bursitis. I have explained the findings to him.  I would not recommend any surgery at the present time.  Continue NSAIDs.   Body mass index is 29.5 kg/m.  ROS  Review of Systems  Respiratory: Negative for cough and shortness of breath.   Cardiovascular: Negative for chest pain and leg swelling.  Endocrine: Negative for cold intolerance.  Musculoskeletal: Positive for arthralgias.  Allergic/Immunologic: Negative for environmental allergies.    All other systems reviewed and are negative.  Past Medical History:  Diagnosis Date  . Hypoglycemia     Past Surgical History:  Procedure Laterality Date  . APPENDECTOMY    . HERNIA REPAIR      Family History  Problem Relation Age of Onset  . Diabetes Maternal Grandfather   . Heart attack Maternal Grandfather   . Congestive Heart Failure Maternal Grandfather   . Heart attack Paternal Grandfather   . Diabetes Mother   . Mental illness Mother   . Diabetes Father   . Heart attack Father   . Mental illness Father   . Diabetes Sister   . Mental illness Brother   . Diabetes Maternal Grandmother   .  Stroke Maternal Grandmother   . Heart attack Paternal Grandmother     Social History Social History   Tobacco Use  . Smoking status: Current Every Day Smoker    Packs/day: 1.00    Types: Cigarettes    Start date: 11/04/1988  . Smokeless tobacco: Former Systems developer    Quit date: 11/13/2012  Substance Use Topics  . Alcohol use: No  . Drug use: No    Allergies  Allergen Reactions  . Penicillins Anaphylaxis and Hives    Has patient had a PCN reaction causing immediate rash, facial/tongue/throat swelling, SOB or lightheadedness with hypotension: yes Has patient had a PCN reaction causing severe rash involving mucus membranes or skin necrosis: yes Has patient had a PCN reaction that required hospitalization: unknown Has patient had a PCN reaction occurring within the last 10 years: No If all of the above answers are "NO", then may proceed with Cephalosporin use.   Andrew Glasgow Lorretta Harp Bromm] Other (See Comments)    Child hood allergy  . Promethazine Hcl Other (See Comments)    paranoid  . Wellbutrin [Bupropion] Anxiety    Current Outpatient Medications  Medication Sig Dispense Refill  . diclofenac (VOLTAREN) 75 MG EC tablet Take 1 tablet (75 mg total) by mouth 2 (two) times daily. Take with food (Patient not taking: Reported on 04/02/2018) 14 tablet 0  . ranitidine (ZANTAC) 300 MG tablet Take 300 mg by mouth at bedtime.     No current facility-administered medications for this visit.      Physical  Exam  Blood pressure 110/77, pulse 88, height 5\' 8"  (1.727 m), weight 194 lb (88 kg).  Constitutional: overall normal hygiene, normal nutrition, well developed, normal grooming, normal body habitus. Assistive device:none  Musculoskeletal: gait and station Limp none, muscle tone and strength are normal, no tremors or atrophy is present.  .  Neurological: coordination overall normal.  Deep tendon reflex/nerve stretch intact.  Sensation normal.  Cranial nerves II-XII intact.   Skin:    Normal overall no scars, lesions, ulcers or rashes. No psoriasis.  Psychiatric: Alert and oriented x 3.  Recent memory intact, remote memory unclear.  Normal mood and affect. Well groomed.  Good eye contact.  Cardiovascular: overall no swelling, no varicosities, no edema bilaterally, normal temperatures of the legs and arms, no clubbing, cyanosis and good capillary refill.  Lymphatic: palpation is normal.  Right shoulder has full ROM but pain in the extremes.  NV intact.  All other systems reviewed and are negative   The patient has been educated about the nature of the problem(s) and counseled on treatment options.  The patient appeared to understand what I have discussed and is in agreement with it.  Encounter Diagnosis  Name Primary?  . Chronic right shoulder pain Yes    PLAN Call if any problems.  Precautions discussed.  Continue current medications.   Return to clinic 1 month   Electronically Signed Sanjuana Kava, MD 7/24/20199:34 AM

## 2018-05-27 NOTE — Patient Instructions (Signed)

## 2018-05-28 ENCOUNTER — Ambulatory Visit (HOSPITAL_COMMUNITY): Payer: Managed Care, Other (non HMO) | Admitting: Occupational Therapy

## 2018-06-03 ENCOUNTER — Ambulatory Visit (HOSPITAL_COMMUNITY): Payer: Managed Care, Other (non HMO)

## 2018-06-03 ENCOUNTER — Telehealth (HOSPITAL_COMMUNITY): Payer: Self-pay

## 2018-06-03 NOTE — Telephone Encounter (Signed)
Called and left message on voicemail regarding no show. This is patient's 2nd no show. No further appointments are scheduled at this time. Requested that patient call us back to let us know if he'd like to continue therapy or be discharged. If he wishes to continue with therapy he will only be able to schedule one appointment at a time due to no shows.   Ailene Ravel, OTR/L,CBIS  [phone]

## 2018-06-24 ENCOUNTER — Ambulatory Visit (INDEPENDENT_AMBULATORY_CARE_PROVIDER_SITE_OTHER): Payer: Managed Care, Other (non HMO) | Admitting: Orthopaedic Surgery

## 2018-06-24 ENCOUNTER — Encounter: Payer: Self-pay | Admitting: Orthopaedic Surgery

## 2018-06-24 VITALS — BP 111/78 | HR 84 | Ht 69.0 in | Wt 193.8 lb

## 2018-06-24 DIAGNOSIS — G8929 Other chronic pain: Secondary | ICD-10-CM | POA: Diagnosis not present

## 2018-06-24 DIAGNOSIS — M25511 Pain in right shoulder: Secondary | ICD-10-CM

## 2018-06-24 NOTE — Progress Notes (Signed)
Patient YB:[facility] Andrew Gauze Sr., male DOB:04-Sep-1976, 42 y.o. TDS:[id number]  Chief Complaint  Patient presents with  . Shoulder Pain    chronic right shld pain    HPI  Andrew Reierson Sr. is a 42 y.o. male who has persistent right shoulder pain.  He had a MRI which had shown no tear.  He could not do his work because of his right shoulder pain.  He continues to hurt.  He is taking his medicine.  I will begin PT for him.  He consents to an injection today.   Body mass index is 28.62 kg/m.  ROS  Review of Systems  Constitutional: Positive for activity change.  Respiratory: Negative for cough and shortness of breath.   Cardiovascular: Negative for chest pain and leg swelling.  Endocrine: Negative for cold intolerance.  Musculoskeletal: Positive for arthralgias.  Allergic/Immunologic: Negative for environmental allergies.  All other systems reviewed and are negative.   All other systems reviewed and are negative.  The following is a summary of the past history medically, past history surgically, known current medicines, social history and family history.  This information is gathered electronically by the computer from prior information and documentation.  I review this each visit and have found including this information at this point in the chart is beneficial and informative.    Past Medical History:  Diagnosis Date  . Hypoglycemia     Past Surgical History:  Procedure Laterality Date  . APPENDECTOMY    . HERNIA REPAIR      Family History  Problem Relation Age of Onset  . Diabetes Maternal Grandfather   . Heart attack Maternal Grandfather   . Congestive Heart Failure Maternal Grandfather   . Heart attack Paternal Grandfather   . Diabetes Mother   . Mental illness Mother   . Diabetes Father   . Heart attack Father   . Mental illness Father   . Diabetes Sister   . Mental illness Brother   . Diabetes Maternal Grandmother   . Stroke Maternal Grandmother   . Heart  attack Paternal Grandmother     Social History Social History   Tobacco Use  . Smoking status: Current Every Day Smoker    Packs/day: 1.00    Types: Cigarettes    Start date: 11/04/1988  . Smokeless tobacco: Former Systems developer    Quit date: 11/13/2012  Substance Use Topics  . Alcohol use: No  . Drug use: No    Allergies  Allergen Reactions  . Penicillins Anaphylaxis and Hives    Has patient had a PCN reaction causing immediate rash, facial/tongue/throat swelling, SOB or lightheadedness with hypotension: yes Has patient had a PCN reaction causing severe rash involving mucus membranes or skin necrosis: yes Has patient had a PCN reaction that required hospitalization: unknown Has patient had a PCN reaction occurring within the last 10 years: No If all of the above answers are "NO", then may proceed with Cephalosporin use.   Andrew Gillespie] Other (See Comments)    Child hood allergy  . Promethazine Hcl Other (See Comments)    paranoid  . Wellbutrin [Bupropion] Anxiety    Current Outpatient Medications  Medication Sig Dispense Refill  . diclofenac (VOLTAREN) 75 MG EC tablet Take 1 tablet (75 mg total) by mouth 2 (two) times daily. Take with food (Patient not taking: Reported on 04/02/2018) 14 tablet 0  . ranitidine (ZANTAC) 300 MG tablet Take 300 mg by mouth at bedtime.     No  current facility-administered medications for this visit.      Physical Exam  Blood pressure 111/78, pulse 84, height 5\' 9"  (1.753 m), weight 193 lb 12.8 oz (87.9 kg).  Constitutional: overall normal hygiene, normal nutrition, well developed, normal grooming, normal body habitus. Assistive device:none  Musculoskeletal: gait and station Limp none, muscle tone and strength are normal, no tremors or atrophy is present.  .  Neurological: coordination overall normal.  Deep tendon reflex/nerve stretch intact.  Sensation normal.  Cranial nerves II-XII intact.   Skin:   Normal overall no scars,  lesions, ulcers or rashes. No psoriasis.  Psychiatric: Alert and oriented x 3.  Recent memory intact, remote memory unclear.  Normal mood and affect. Well groomed.  Good eye contact.  Cardiovascular: overall no swelling, no varicosities, no edema bilaterally, normal temperatures of the legs and arms, no clubbing, cyanosis and good capillary refill.  Lymphatic: palpation is normal.  Examination of right Upper Extremity is done.  Inspection:   Overall:  Elbow non-tender without crepitus or defects, forearm non-tender without crepitus or defects, wrist non-tender without crepitus or defects, hand non-tender.    Shoulder: with glenohumeral joint tenderness, without effusion.   Upper arm: without swelling and tenderness   Range of motion:   Overall:  Full range of motion of the elbow, full range of motion of wrist and full range of motion in fingers.   Shoulder:  right  165 degrees forward flexion; 150 degrees abduction; 35 degrees internal rotation, 35 degrees external rotation, 15 degrees extension, 40 degrees adduction.   Stability:   Overall:  Shoulder, elbow and wrist stable   Strength and Tone:   Overall full shoulder muscles strength, full upper arm strength and normal upper arm bulk and tone.  All other systems reviewed and are negative   The patient has been educated about the nature of the problem(s) and counseled on treatment options.  The patient appeared to understand what I have discussed and is in agreement with it.  Encounter Diagnosis  Name Primary?  . Chronic right shoulder pain Yes    PROCEDURE NOTE:  The patient request injection, verbal consent was obtained.  The right shoulder was prepped appropriately after time out was performed.   Sterile technique was observed and injection of 1 cc of Depo-Medrol 40 mg with several cc's of plain xylocaine. Anesthesia was provided by ethyl chloride and a 20-gauge needle was used to inject the shoulder area. A posterior  approach was used.  The injection was tolerated well.  A band aid dressing was applied.  The patient was advised to apply ice later today and tomorrow to the injection sight as needed.   PLAN Call if any problems.  Precautions discussed.  Continue current medications.   Return to clinic 2 weeks   Begin PT/OT.  Electronically Signed Sanjuana Kava, MD 8/21/201910:27 AM

## 2018-07-08 ENCOUNTER — Ambulatory Visit: Payer: Managed Care, Other (non HMO) | Admitting: Orthopaedic Surgery

## 2018-07-08 ENCOUNTER — Encounter: Payer: Self-pay | Admitting: Orthopaedic Surgery

## 2018-09-17 ENCOUNTER — Encounter (HOSPITAL_COMMUNITY): Payer: Self-pay

## 2018-09-17 NOTE — Therapy (Signed)
[place], [place], [address] Phone: [phone]   Fax:  [phone]  Patient Details  Name: Andrew Elsayed Sr. MRN: [id number] Date of Birth: May 24, 1976 Referring Provider:  No ref. provider found  Encounter Date: 09/17/2018   OCCUPATIONAL THERAPY DISCHARGE SUMMARY  Visits from Start of Care: 2  Current functional level related to goals / functional outcomes: OT SHORT TERM GOAL #1    Title  Patient will be educated and independent with HEP to faciliate progress in therapy and allow him to return to using his RUE as his dominant extremity for all daily and leisure tasks.      Time  4    Period  Weeks    Status  On-going        OT SHORT TERM GOAL #2   Title  Patient will increase RUE strength to 5/5 in order to complete daily tasks and be able to lift his grandson.    Time  4    Period  Weeks    Status  On-going        OT SHORT TERM GOAL #3   Title  Patient will increase RUE A/ROM to [facility] to increase ability to complete overhead reaching tasks with less difficulty.    Time  4    Period  Weeks    Status  On-going        OT SHORT TERM GOAL #4   Title  Patient will decrease pain level in RUE during leisure and daily tasks to a 6/10 or less.    Time  4    Period  Weeks    Status  On-going        OT SHORT TERM GOAL #5   Title  Patient will decrease fascial restrictions in RUE to min amount or less in order to increase functional mobility needed to complete functional reaching tasks.      Time  4    Period  Weeks    Status  On-going       Remaining deficits: All deficits remain. Patient did not return to clinic after last session. Attempted to contact patient. No return communication. Patient will be discharge from OT services.    Education / Equipment: Shoulder HEP Plan:                                                    Patient goals were not met. Patient is being  discharged due to not returning since the last visit.  ?????          Ailene Ravel, OTR/L,CBIS  [phone]  09/17/2018, 3:44 PM  [place] [address] [place], [place], [address] Phone: [phone]   Fax:  [phone]

## 2018-11-23 DIAGNOSIS — F4310 Post-traumatic stress disorder, unspecified: Secondary | ICD-10-CM | POA: Insufficient documentation

## 2018-11-23 DIAGNOSIS — Z8659 Personal history of other mental and behavioral disorders: Secondary | ICD-10-CM | POA: Insufficient documentation

## 2018-11-23 DIAGNOSIS — F3181 Bipolar II disorder: Secondary | ICD-10-CM | POA: Insufficient documentation

## 2019-07-14 ENCOUNTER — Other Ambulatory Visit: Payer: Self-pay

## 2019-07-15 ENCOUNTER — Emergency Department (HOSPITAL_COMMUNITY)
Admission: EM | Admit: 2019-07-15 | Discharge: 2019-07-15 | Discharge status: Against Medical Advice | Payer: Managed Care, Other (non HMO)

## 2019-12-06 ENCOUNTER — Encounter: Payer: Self-pay | Admitting: Family Medicine

## 2019-12-25 ENCOUNTER — Other Ambulatory Visit: Payer: Self-pay

## 2019-12-25 ENCOUNTER — Emergency Department (HOSPITAL_COMMUNITY)
Admission: EM | Admit: 2019-12-25 | Discharge: 2019-12-25 | Disposition: A | Discharge status: Home / Self Care | Payer: Managed Care, Other (non HMO) | Attending: Emergency Medicine | Admitting: Emergency Medicine

## 2019-12-25 ENCOUNTER — Encounter (HOSPITAL_COMMUNITY): Payer: Self-pay | Admitting: Emergency Medicine

## 2019-12-25 ENCOUNTER — Emergency Department (HOSPITAL_COMMUNITY): Payer: Managed Care, Other (non HMO)

## 2019-12-25 DIAGNOSIS — Y939 Activity, unspecified: Secondary | ICD-10-CM | POA: Diagnosis not present

## 2019-12-25 DIAGNOSIS — S8992XA Unspecified injury of left lower leg, initial encounter: Secondary | ICD-10-CM | POA: Diagnosis present

## 2019-12-25 DIAGNOSIS — S81012A Laceration without foreign body, left knee, initial encounter: Secondary | ICD-10-CM | POA: Diagnosis not present

## 2019-12-25 DIAGNOSIS — W000XXA Fall on same level due to ice and snow, initial encounter: Secondary | ICD-10-CM | POA: Diagnosis not present

## 2019-12-25 DIAGNOSIS — Y990 Civilian activity done for income or pay: Secondary | ICD-10-CM | POA: Insufficient documentation

## 2019-12-25 DIAGNOSIS — Y92512 Supermarket, store or market as the place of occurrence of the external cause: Secondary | ICD-10-CM | POA: Diagnosis not present

## 2019-12-25 DIAGNOSIS — M25562 Pain in left knee: Secondary | ICD-10-CM

## 2019-12-25 DIAGNOSIS — F17210 Nicotine dependence, cigarettes, uncomplicated: Secondary | ICD-10-CM | POA: Insufficient documentation

## 2019-12-25 MED ORDER — BACITRACIN ZINC 500 UNIT/GM EX OINT
TOPICAL_OINTMENT | Freq: Once | CUTANEOUS | Status: AC
Start: 2019-12-25 — End: 2019-12-25
  Administered 2019-12-25: 1 via TOPICAL
  Filled 2019-12-25: qty 0.9

## 2019-12-25 NOTE — Discharge Instructions (Signed)
Your x-ray is clear today.  This laceration should heal well on its own.  Apply antibiotic ointment and keep wound clean, dry and covered.  You can shower and wash the wound with warm water and gentle soap.  Use Ace wrap to help support the knee avoid heavy activity or lots of knee bending for the next few days while healing and scab formation begins.

## 2019-12-25 NOTE — ED Triage Notes (Signed)
Patient c/o left knee pain with laceration. Per patient slipped on ice today at work and hit knee on roll tainer. Bleeding controlled. No obvious deformity. Patient states last tetanus vaccination has been within 5 years.

## 2019-12-25 NOTE — ED Provider Notes (Signed)
[facility] EMERGENCY DEPARTMENT Provider Note   CSN: [id number] Arrival date & time: 12/25/19  1114     History Chief Complaint  Patient presents with  . Knee Pain    Aris Everts Fait Sr. is a 44 y.o. male.  Aris Everts Ozier Sr. is a 44 y.o. male with history of hypoglycemia, insomnia, and depression, who presents to the ED for evaluation of left knee injury.  Patient reports between 945 and 10 AM while at work this morning at [employer] he slipped on some ice and fell striking his knee on a rolling  container.  He reports laceration over the front of the knee with bleeding controlled.  He states there is some mild pain over the front of the knee but he has not noted any swelling and he has been able to walk without difficulty.  No numbness tingling or weakness in the leg.  He states that his tetanus has been updated within the last 5 years.  No other injuries from the fall.  No other aggravating or alleviating factors.        Past Medical History:  Diagnosis Date  . Hypoglycemia     Patient Active Problem List   Diagnosis Date Noted  . Fatigue 07/05/2015  . Insomnia 07/05/2015  . Depression 07/05/2015  . Esophageal reflux 02/16/2013    Past Surgical History:  Procedure Laterality Date  . APPENDECTOMY    . HERNIA REPAIR         Family History  Problem Relation Age of Onset  . Diabetes Maternal Grandfather   . Heart attack Maternal Grandfather   . Congestive Heart Failure Maternal Grandfather   . Heart attack Paternal Grandfather   . Diabetes Mother   . Mental illness Mother   . Diabetes Father   . Heart attack Father   . Mental illness Father   . Diabetes Sister   . Mental illness Brother   . Diabetes Maternal Grandmother   . Stroke Maternal Grandmother   . Heart attack Paternal Grandmother     Social History   Tobacco Use  . Smoking status: Current Every Day Smoker    Packs/day: 1.00    Types: Cigarettes    Start date: 11/04/1988  . Smokeless  tobacco: Former Systems developer    Quit date: 11/13/2012  Substance Use Topics  . Alcohol use: No  . Drug use: No    Home Medications Prior to Admission medications   Medication Sig Start Date End Date Taking? Authorizing Provider  diclofenac (VOLTAREN) 75 MG EC tablet Take 1 tablet (75 mg total) by mouth 2 (two) times daily. Take with food Patient not taking: Reported on 04/02/2018 03/30/18   Triplett, Tammy, PA-C  ranitidine (ZANTAC) 300 MG tablet Take 300 mg by mouth at bedtime.    [provider]    Allergies    Penicillins, Dimetapp [albertsons di bromm], Promethazine hcl, and Wellbutrin [bupropion]  Review of Systems   Review of Systems  Constitutional: Negative for chills and fever.  Musculoskeletal: Positive for arthralgias. Negative for joint swelling.  Skin: Positive for wound.  Neurological: Negative for weakness and numbness.    Physical Exam Updated Vital Signs BP 115/79 (BP Location: Right Arm)   Pulse 100   Temp 98.2 F (36.8 C) (Oral)   Resp 16   Ht 5\' 8"  (1.727 m)   Wt 83.9 kg   SpO2 99%   BMI 28.13 kg/m   Physical Exam Vitals and nursing note reviewed.  Constitutional:  Appearance: Normal appearance. He is normal weight. He is not ill-appearing.  HENT:     Head: Normocephalic and atraumatic.  Eyes:     General:        Right eye: No discharge.        Left eye: No discharge.  Pulmonary:     Effort: Pulmonary effort is normal. No respiratory distress.  Musculoskeletal:     Comments: Mild tenderness to palpation over the anterior left knee with 3 cm very superficial laceration (as pictured below), no active bleeding, no palpable deformity or crepitus, able to flex and extend the knee without difficulty, no joint laxity. Leg is warm and well perfused, no tenderness over the hip or ankle.  Skin:    General: Skin is warm and dry.     Capillary Refill: Capillary refill takes less than 2 seconds.  Neurological:     Mental Status: He is alert and  oriented to person, place, and time.     Comments: Normal strength and sensation in the left lower extremity.  Psychiatric:        Mood and Affect: Mood normal.        Behavior: Behavior normal.       ED Results / Procedures / Treatments   Labs (all labs ordered are listed, but only abnormal results are displayed) Labs Reviewed - No data to display  EKG None  Radiology DG Knee Complete 4 Views Left  Result Date: 12/25/2019 CLINICAL DATA:  Left knee pain post fall. EXAM: LEFT KNEE - COMPLETE 4+ VIEW COMPARISON:  None. FINDINGS: No evidence of fracture, dislocation, or joint effusion. No evidence of arthropathy or other focal bone abnormality. Soft tissues are unremarkable. IMPRESSION: Negative. Electronically Signed   By: Marin Olp M.D.   On: 12/25/2019 12:12    Procedures Procedures (including critical care time)  Medications Ordered in ED Medications  bacitracin ointment (has no administration in time range)    ED Course  I have reviewed the triage vital signs and the nursing notes.  Pertinent labs & imaging results that were available during my care of the patient were reviewed by me and considered in my medical decision making (see chart for details).    MDM Rules/Calculators/A&P                     44 year old male presents to the ED for evaluation of left knee injury while at work.  He has a small superficial laceration, but no obvious deformity to the knee.  The left lower extremity is neurovascularly intact.  X-ray with no evidence of fracture or other acute normality.  Laceration cleaned, it is not deep enough to warrant suture repair, feel laceration will heal well on its own, will apply bacitracin and dressing as well as Ace bandage.  Discussed appropriate wound care and symptomatic control with patient.  He expresses understanding and agreement with plan.  Discharged home in good condition.  Final Clinical Impression(s) / ED Diagnoses Final diagnoses:  Knee  laceration, left, initial encounter  Acute pain of left knee    Rx / DC Orders ED Discharge Orders    None       Janet Berlin 12/25/19 1249    Milton Ferguson, MD 12/26/19 [phone]

## 2022-01-20 DIAGNOSIS — Y990 Civilian activity done for income or pay: Secondary | ICD-10-CM | POA: Insufficient documentation

## 2022-01-20 DIAGNOSIS — S46912A Strain of unspecified muscle, fascia and tendon at shoulder and upper arm level, left arm, initial encounter: Secondary | ICD-10-CM | POA: Diagnosis not present

## 2022-01-20 DIAGNOSIS — W230XXA Caught, crushed, jammed, or pinched between moving objects, initial encounter: Secondary | ICD-10-CM | POA: Diagnosis not present

## 2022-01-20 DIAGNOSIS — S4992XA Unspecified injury of left shoulder and upper arm, initial encounter: Secondary | ICD-10-CM | POA: Diagnosis present

## 2022-01-21 ENCOUNTER — Emergency Department (HOSPITAL_COMMUNITY)
Admission: EM | Admit: 2022-01-21 | Discharge: 2022-01-21 | Disposition: A | Discharge status: Home / Self Care | Payer: Worker's Compensation | Attending: Emergency Medicine | Admitting: Emergency Medicine

## 2022-01-21 ENCOUNTER — Other Ambulatory Visit: Payer: Self-pay

## 2022-01-21 ENCOUNTER — Encounter (HOSPITAL_COMMUNITY): Payer: Self-pay

## 2022-01-21 ENCOUNTER — Emergency Department (HOSPITAL_COMMUNITY): Payer: Worker's Compensation

## 2022-01-21 DIAGNOSIS — S46912A Strain of unspecified muscle, fascia and tendon at shoulder and upper arm level, left arm, initial encounter: Secondary | ICD-10-CM

## 2022-01-21 MED ORDER — METHOCARBAMOL 500 MG PO TABS: 500.0000 mg | ORAL_TABLET | Freq: Two times a day (BID) | ORAL | 0 refills | Status: DC

## 2022-01-21 MED ORDER — NAPROXEN 500 MG PO TABS: 500.0000 mg | ORAL_TABLET | Freq: Two times a day (BID) | ORAL | 0 refills | Status: DC

## 2022-01-21 NOTE — ED Provider Notes (Signed)
? ?[facility]  ?Provider Note ? ?CSN: [id number] ?Arrival date & time: 01/20/22 2354 ? ?History ?Chief Complaint  ?Patient presents with  ? Shoulder Injury  ? ? ?Andrew Everts Etsitty Sr. is a 46 y.o. male with no significant PMH reports he slipped at work earlier this evening and caught himself with his L hand at an awkward angle injuring his L shoulder. He felt like it had 'popped out' so he pushed it against a wall to put it back in. He reports some continued pain, mostly in his L trapezius and worse with movement.   ? ? ?Home Medications ?Prior to Admission medications   ?Medication Sig Start Date End Date Taking? Authorizing Provider  ?methocarbamol (ROBAXIN) 500 MG tablet Take 1 tablet (500 mg total) by mouth 2 (two) times daily. 01/21/22  Yes Truddie Hidden, MD  ?naproxen (NAPROSYN) 500 MG tablet Take 1 tablet (500 mg total) by mouth 2 (two) times daily. 01/21/22  Yes Truddie Hidden, MD  ?ranitidine (ZANTAC) 300 MG tablet Take 300 mg by mouth at bedtime.    [provider]  ? ? ? ?Allergies    ?Penicillins, Dimetapp [albertsons di bromm], Promethazine hcl, and Wellbutrin [bupropion] ? ? ?Review of Systems   ?Review of Systems ?Please see HPI for pertinent positives and negatives ? ?Physical Exam ?BP (!) 128/91   Pulse 97   Temp 98.3 ?F (36.8 ?C)   Resp 17   Ht '5\' 9"'$  (1.753 m)   Wt 83.9 kg   SpO2 95%   BMI 27.32 kg/m?  ? ?Physical Exam ?Vitals and nursing note reviewed.  ?HENT:  ?   Head: Normocephalic.  ?   Nose: Nose normal.  ?Eyes:  ?   Extraocular Movements: Extraocular movements intact.  ?Pulmonary:  ?   Effort: Pulmonary effort is normal.  ?Musculoskeletal:     ?   General: Tenderness (L trapezius) present. No deformity.  ?   Cervical back: Neck supple.  ?   Comments: No bony tenderness, decreased ROM due to pain. NVI distally  ?Skin: ?   Findings: No rash (on exposed skin).  ?Neurological:  ?   Mental Status: He is alert and oriented to person, place, and time.   ?Psychiatric:     ?   Mood and Affect: Mood normal.  ? ? ?ED Results / Procedures / Treatments   ?EKG ?None ? ?Procedures ?Procedures ? ?Medications Ordered in the ED ?Medications - No data to display ? ?Initial Impression and Plan ? Patient with L shoulder injury at work. Will check xray. Dispo accordingly.  ? ?ED Course  ? ?Clinical Course as of 01/21/22 0057  ?Mon Jan 21, 2022  ?0051 I personally viewed the images from radiology studies and agree with radiologist interpretation: shoulder xray is neg.  ? [CS]  ?2202 Patient resting comfortably. Will provide sling for comfort. NSAID/Muscle relaxers at home as needed. Ortho follow up to be cleared to return to work.  [CS]  ?  ?Clinical Course User Index ?[CS] Truddie Hidden, MD  ? ? ? ?MDM Rules/Calculators/A&P ?Medical Decision Making ?Problems Addressed: ?Left shoulder strain, initial encounter: acute illness or injury ? ?Amount and/or Complexity of Data Reviewed ?Radiology: ordered and independent interpretation performed. Decision-making details documented in ED Course. ? ?Risk ?Prescription drug management. ? ? ? ?Final Clinical Impression(s) / ED Diagnoses ?Final diagnoses:  ?Left shoulder strain, initial encounter  ? ? ?Rx / DC Orders ?ED Discharge Orders   ? ?  Ordered  ?  naproxen (NAPROSYN) 500 MG tablet  2 times daily       ? 01/21/22 0054  ?  methocarbamol (ROBAXIN) 500 MG tablet  2 times daily       ? 01/21/22 0054  ? ?  ?  ? ?  ? ?  ?Truddie Hidden, MD ?01/21/22 [phone] ? ?

## 2022-01-21 NOTE — ED Notes (Signed)
Went over d/c  papers.  Ambulatory to lobby. All questions answered  ?

## 2022-01-21 NOTE — ED Triage Notes (Signed)
Pov from home . Sent here due to his work. States he was carrying heavy items when it slipped. Says his left shoulder popped out of joint and he popped it back in. Says he is still in pain.  ?

## 2022-01-22 ENCOUNTER — Telehealth: Payer: Self-pay | Admitting: Orthopedic Surgery

## 2022-01-22 NOTE — Telephone Encounter (Signed)
Call from patient following Emergency room visit at [facility] yesterday, 01/21/22, for shoulder strain. Discussed worker's comp protocol; aware we are happy to schedule  - states his employer , [employer], will need a release back to work from [place]; therefore, will wait to hear further from employer for approval. ?

## 2022-01-29 NOTE — Telephone Encounter (Signed)
Call received via voice message 01/28/22, from Deberah Pelton nurse case manager - ph# [phone]. Approved and scheduled for appointment w/Dr Amedeo Kinsman, 02/01/22; patient made aware. Reference Claim #[id number], date of injury 01/20/22.   ?

## 2022-02-01 ENCOUNTER — Encounter: Payer: Self-pay | Admitting: Orthopedic Surgery

## 2022-02-01 ENCOUNTER — Ambulatory Visit (INDEPENDENT_AMBULATORY_CARE_PROVIDER_SITE_OTHER): Payer: Worker's Compensation | Admitting: Orthopedic Surgery

## 2022-02-01 VITALS — Ht 69.0 in | Wt 185.0 lb

## 2022-02-01 DIAGNOSIS — M7582 Other shoulder lesions, left shoulder: Secondary | ICD-10-CM | POA: Diagnosis not present

## 2022-02-01 NOTE — Progress Notes (Signed)
New Patient Visit ? ?Assessment: ?Andrew Minney Sr. is a 46 y.o. RHD male with the following: ?1. Tendinitis of left rotator cuff; possible acute injury  ? ?Plan: ?Andrew Gaut Sr. sustained an injury to his left shoulder while at work recently.  He has had pain in the lateral shoulder and some difficulty with overhead motion.  He has tried Naproxen and Methocarbamol without sustained relief.  On physical exam, he has some ROM limitations and pain with strength testing.  We discussed proceeding with a steroid injection, and he elected to proceed.  Shoulder exercises provided.  No lifting anything greater than 10 lbs, note provided.  Follow up in 1 month.  If not improving, consider MRI or PT. ? ?Procedure note injection Left shoulder  ?  ?Verbal consent was obtained to inject the left shoulder, subacromial space ?Timeout was completed to confirm the site of injection.  The skin was prepped with alcohol and ethyl chloride was sprayed at the injection site.  ?A 21-gauge needle was used to inject 40 mg of Depo-Medrol and 1% lidocaine (3 cc) into the subacromial space of the left shoulder using a posterolateral approach.  ?There were no complications. A sterile bandage was applied. ? ? ?Follow-up: ?Return in about 4 weeks (around 03/01/2022). ? ?Subjective: ? ?Chief Complaint  ?Patient presents with  ? Shoulder Injury  ?  Lt shoulder DOI 01/20/22  ? ? ?History of Present Illness: ?Andrew Everts Hietala Sr. is a 46 y.o. male who presents for evaluation of left shoulder pain.  Approximately 2 weeks ago, he was trying to lift some crates of milk at work.  He tripped and stumbled and tried to catch himself with his left arm.  He noted immediate pain.  He denies a tearing or popping sensation.  He felt as though his shoulder was "about to pop out" so he rested against a wall, and this sensation improved.  He was evaluated in the ED and provided with a sling, Naproxen and Robaxin.  His pain has been about the same.  He  continues to use the sling, but would like to get rid of it.  Pain is localized to the lateral shoulder.  No prior injury to his shoulder.   ? ? ?Review of Systems: ?No fevers or chills ?No numbness or tingling ?No chest pain ?No shortness of breath ?No bowel or bladder dysfunction ?No GI distress ?No headaches ? ? ?Medical History: ? ?Past Medical History:  ?Diagnosis Date  ? Hypoglycemia   ? ? ?Past Surgical History:  ?Procedure Laterality Date  ? APPENDECTOMY    ? HERNIA REPAIR    ? ? ?Family History  ?Problem Relation Age of Onset  ? Diabetes Maternal Grandfather   ? Heart attack Maternal Grandfather   ? Congestive Heart Failure Maternal Grandfather   ? Heart attack Paternal Grandfather   ? Diabetes Mother   ? Mental illness Mother   ? Diabetes Father   ? Heart attack Father   ? Mental illness Father   ? Diabetes Sister   ? Mental illness Brother   ? Diabetes Maternal Grandmother   ? Stroke Maternal Grandmother   ? Heart attack Paternal Grandmother   ? ?Social History  ? ?Tobacco Use  ? Smoking status: Every Day  ?  Packs/day: 1.00  ?  Types: Cigarettes  ?  Start date: 11/04/1988  ? Smokeless tobacco: Former  ?  Quit date: 11/13/2012  ?Vaping Use  ? Vaping Use: Never used  ?Substance Use  Topics  ? Alcohol use: No  ? Drug use: No  ? ? ?Allergies  ?Allergen Reactions  ? Penicillins Anaphylaxis and Hives  ?  Has patient had a PCN reaction causing immediate rash, facial/tongue/throat swelling, SOB or lightheadedness with hypotension: yes ?Has patient had a PCN reaction causing severe rash involving mucus membranes or skin necrosis: yes ?Has patient had a PCN reaction that required hospitalization: unknown ?Has patient had a PCN reaction occurring within the last 10 years: No ?If all of the above answers are "NO", then may proceed with Cephalosporin use. ?  ? Dimetapp Lorretta Harp Bromm] Other (See Comments)  ?  Child hood allergy  ? Promethazine Hcl Other (See Comments)  ?  paranoid  ? Wellbutrin [Bupropion] Anxiety   ? ? ?No outpatient medications have been marked as taking for the 02/01/22 encounter (Office Visit) with Mordecai Rasmussen, MD.  ? ? ?Objective: ?Ht '5\' 9"'$  (1.753 m)   Wt 185 lb (83.9 kg)   BMI 27.32 kg/m?  ? ?Physical Exam: ? ?General: Alert and oriented. and No acute distress. ?Gait: Normal gait. ? ?Left shoulder without deformity.  Near full ROM with some discomfort at extremes.  Negative belly press.  4+/5 supraspinatus, 5/5 infraspinatus.  Pain in the empty can testing position.  Negative apprehension. Fingers are warm and well perfused.  ? ?IMAGING: ?I personally reviewed images previously obtained from the ED ? ?Left shoulder XR without acute injury.  Glenohumeral joint is reduced.  Minimal degenerative changes.  ? ? ?New Medications:  ?No orders of the defined types were placed in this encounter. ? ? ? ? ?Mordecai Rasmussen, MD ? ?02/01/2022 ?9:56 PM ? ? ?

## 2022-02-01 NOTE — Telephone Encounter (Signed)
(  Cont'd) Worker's Data processing manager, through [facility], [place], [place] [address]  Ph [phone] / fax [phone]; Epifanio Lesches McGuine; patient has also been assigned to nurse case manager, Ledora Bottcher, ph [phone] / fax [phone].  CLAIM# [id number] / Date of injury 01/20/22 ?

## 2022-02-01 NOTE — Patient Instructions (Signed)
Note for work, no lifting greater than 10 lbs ? ? ? ?Instructions Following Joint Injections ? ?In clinic today, you received an injection in one of your joints (sometimes more than one).  Occasionally, you can have some pain at the injection site, this is normal.  You can place ice at the injection site, or take over-the-counter medications such as Tylenol (acetaminophen) or Advil (ibuprofen).  Please follow all directions listed on the bottle. ? ?If your joint (knee or shoulder) becomes swollen, red or very painful, please contact the clinic for additional assistance.  ? ?Two medications were injected, including lidocaine and a steroid (often referred to as cortisone).  Lidocaine is effective almost immediately but wears off quickly.  However, the steroid can take a few days to improve your symptoms.  In some cases, it can make your pain worse for a couple of days.  Do not be concerned if this happens as it is common.  You can apply ice or take some over-the-counter medications as needed.  ? ? ? ?Rotator Cuff Tear/Tendinitis Rehab  ? ?Ask your health care provider which exercises are safe for you. Do exercises exactly as told by your health care provider and adjust them as directed. It is normal to feel mild stretching, pulling, tightness, or discomfort as you do these exercises. Stop right away if you feel sudden pain or your pain gets worse. Do not begin these exercises until told by your health care provider. ?Stretching and range-of-motion exercises ? ?These exercises warm up your muscles and joints and improve the movement and flexibility of your shoulder. These exercises also help to relieve pain. ? ?Shoulder pendulum ?In this exercise, you let the injured arm dangle toward the floor and then swing it like a clock pendulum. ?Stand near a table or counter that you can hold onto for balance. ?Bend forward at the waist and let your left / right arm hang straight down. Use your other arm to support you and help  you stay balanced. ?Relax your left / right arm and shoulder muscles, and move your hips and your trunk so your left / right arm swings freely. Your arm should swing because of the motion of your body, not because you are using your arm or shoulder muscles. ?Keep moving your hips and trunk so your arm swings in the following directions, as told by your health care provider: ?Side to side. ?Forward and backward. ?In clockwise and counterclockwise circles. ?Slowly return to the starting position. ?Repeat 10 times, or for 10 seconds per direction. Complete this exercise 2-3 times a day. ?  ?   ?Shoulder flexion, seated ?This exercise is sometimes called table slides. In this exercise, you raise your arm in front of your body until you feel a stretch in your injured shoulder. ?Sit in a stable chair so your left / right forearm can rest on a flat surface. Your elbow should rest at a height that keeps your upper arm next to your body. ?Keeping your left / right shoulder relaxed, lean forward at the waist and let your hand slide forward (flexion). Stop when you feel a stretch in your shoulder, or when you reach the angle that is recommended by your health care provider. ?Hold for 5 seconds. ?Slowly return to the starting position. ?Repeat 10 times. Complete this exercise 1-2  times a day. ?      ?Shoulder flexion, standing ?In this exercise, you raise your arm in front of your body (flexion) until you feel  a stretch in your injured shoulder. ?Stand and hold a broomstick, a cane, or a similar object. Place your hands a little more than shoulder-width apart on the object. Your left / right hand should be palm-up, and your other hand should be palm-down. ?Keep your elbow straight and your shoulder muscles relaxed. Push the stick up with your healthy arm to raise your left / right arm in front of your body, and then over your head until you feel a stretch in your shoulder. ?Avoid shrugging your shoulder while you raise your  arm. Keep your shoulder blade tucked down toward the middle of your back. ?Keep your left / right shoulder muscles relaxed. ?Hold for 10 seconds. ?Slowly return to the starting position. ?Repeat 10 times. Complete this exercise 1-2 times a day. ?  ?   ?Shoulder abduction, active-assisted ?You will need a stick, broom handle, or similar object to help you (assist) in doing this exercise. ?Lie on your back. This is the supine position. Hold a broomstick, a cane, or a similar object. ?Place your hands a little more than shoulder-width apart on the object. Your left / right hand should be palm-up, and your other hand should be palm-down. ?Keeping your shoulder relaxed, push the stick to raise your left / right arm out to your side (abduction) and then over your head. Use your other hand to help move the stick. Stop when you feel a stretch in your shoulder, or when you reach the angle that is recommended by your health care provider. ?Avoid shrugging your shoulder while you raise your arm. Keep your shoulder blade tucked down toward the middle of your back. ?Hold for 10 seconds. ?Slowly return to the starting position. ?Repeat 10 times. Complete this exercise 1-2 times a day. ?  ?   ?Shoulder flexion, active-assisted ?Lie on your back. You may bend your knees for comfort. ?Hold a broomstick, a cane, or a similar object so that your hands are about shoulder-width apart. Your palms should face toward your feet. ?Raise your left / right arm over your head, then behind your head toward the floor (flexion). Use your other hand to help you do this (active-assisted). Stop when you feel a gentle stretch in your shoulder, or when you reach the angle that is recommended by your health care provider. ?Hold for 10 seconds. ?Use the stick and your other arm to help you return your left / right arm to the starting position. ?Repeat 10 times. Complete this exercise 1-2 times a day. ?  ?   ?External rotation ?Sit in a stable chair  without armrests, or stand up. ?Tuck a soft object, such as a folded towel or a small ball, under your left / right upper arm. ?Hold a broomstick, a cane, or a similar object with your palms face-down, toward the floor. Bend your elbows to a 90-degree angle (right angle), and keep your hands about shoulder-width apart. ?Straighten your healthy arm and push the stick across your body, toward your left / right side. Keep your left / right arm bent. This will rotate your left / right forearm away from your body (external rotation). ?Hold for 10 seconds. ?Slowly return to the starting position. ?Repeat 10 times. Complete this exercise 1-2 times a day.  ?   ? ? ? ?Strengthening exercises ?These exercises build strength and endurance in your shoulder. Endurance is the ability to use your muscles for a long time, even after they get tired. Do not start doing these  exercises until your health care provider approves. ?Shoulder flexion, isometric ?Stand or sit in a doorway, facing the door frame. ?Keep your left / right arm straight and make a gentle fist with your hand. Place your fist against the door frame. Only your fist should be touching the frame. Keep your upper arm at your side. ?Gently press your fist against the door frame, as if you are trying to raise your arm above your head (isometric shoulder flexion). ?Avoid shrugging your shoulder while you press your hand into the door frame. Keep your shoulder blade tucked down toward the middle of your back. ?Hold for 10 seconds. ?Slowly release the tension, and relax your muscles completely before you repeat the exercise. ?Repeat 10 times. Complete this exercise 3 times per week. ?     ?Shoulder abduction, isometric ?Stand or sit in a doorway. Your left / right arm should be closest to the door frame. ?Keep your left / right arm straight, and place the back of your hand against the door frame. Only your hand should be touching the frame. Keep the rest of your arm close  to your side. ?Gently press the back of your hand against the door frame, as if you are trying to raise your arm out to the side (isometric shoulder abduction). ?Avoid shrugging your shoulder while you press

## 2022-02-11 ENCOUNTER — Telehealth: Payer: Self-pay | Admitting: Radiology

## 2022-02-11 NOTE — Telephone Encounter (Signed)
Dr Amedeo Kinsman' patient called, said that since he returned to work his pain has increased and he is having less ROM.  Can we adjust OOW note to no work until f/u if he wishes?  Or would you need to see him in the office this week since Dr Amedeo Kinsman is out?  Please advise.  ?

## 2022-02-12 NOTE — Telephone Encounter (Signed)
I called patient and he wants a note for OOW through 48/28 when he sees Dr Amedeo Kinsman again.  Note done, patient will pick it up. ?

## 2022-02-13 ENCOUNTER — Telehealth: Payer: Self-pay | Admitting: Radiology

## 2022-02-13 NOTE — Telephone Encounter (Signed)
FYI I scheduled patient to see you on Thursday this week.  Work note given, and WC is asking if he can be seen/evaluated and MRI ordered.  Instead of waiting til next appt with Dr Amedeo Kinsman on 03/01/22.  Dr Amedeo Kinsman' last note said look at ordering MRI vs PT.   ?

## 2022-02-14 ENCOUNTER — Ambulatory Visit (INDEPENDENT_AMBULATORY_CARE_PROVIDER_SITE_OTHER): Payer: Worker's Compensation | Admitting: Orthopaedic Surgery

## 2022-02-14 ENCOUNTER — Encounter: Payer: Self-pay | Admitting: Orthopaedic Surgery

## 2022-02-14 DIAGNOSIS — M7582 Other shoulder lesions, left shoulder: Secondary | ICD-10-CM

## 2022-02-14 NOTE — Addendum Note (Signed)
Addended by: Brand Males E on: 02/14/2022 10:39 AM ? ? Modules accepted: Orders ? ?

## 2022-02-14 NOTE — Progress Notes (Signed)
My shoulder is hurting more. ? ?He has a Worker's Comp injury to the left shoulder that is not improving.  He has seen Dr. Amedeo Kinsman. Dr. Amedeo Kinsman recommended a MRI.  His insurance carrier has approved this.  I am to order it today.  He has no new injury. ? ?Examination of left Upper Extremity is done. ? Inspection: ?  Overall:  Elbow non-tender without crepitus or defects, forearm non-tender without crepitus or defects, wrist non-tender without crepitus or defects, hand non-tender.  ?  Shoulder: with glenohumeral joint tenderness, without effusion. ?  Upper arm:  without swelling and tenderness ? ? Range of motion: ?  Overall:  Full range of motion of the elbow, full range of motion of wrist and full range of motion in fingers. ?  Shoulder:  left  150 degrees forward flexion; 135 degrees abduction; 30 degrees internal rotation, 25 degrees external rotation, 5 degrees extension, 40 degrees adduction. ? ? Stability: ?  Overall:  Shoulder, elbow and wrist stable ? ? Strength and Tone: ?  Overall full shoulder muscles strength, full upper arm strength and normal upper arm bulk and tone.  ? ?Encounter Diagnosis  ?Name Primary?  ? Tendinitis of left rotator cuff Yes  ? ?To get MRI of the left shoulder. ? ?Call if any problem. ? ?Precautions discussed. ? ?Electronically Signed ?Sanjuana Kava, MD ?4/13/202310:30 AM ? ?

## 2022-03-01 ENCOUNTER — Encounter: Payer: Self-pay | Admitting: Orthopedic Surgery

## 2022-03-01 ENCOUNTER — Ambulatory Visit (INDEPENDENT_AMBULATORY_CARE_PROVIDER_SITE_OTHER): Payer: Worker's Compensation | Admitting: Orthopedic Surgery

## 2022-03-01 VITALS — Ht 69.0 in | Wt 185.0 lb

## 2022-03-01 DIAGNOSIS — S46012D Strain of muscle(s) and tendon(s) of the rotator cuff of left shoulder, subsequent encounter: Secondary | ICD-10-CM

## 2022-03-01 NOTE — Progress Notes (Signed)
Orthopaedic Clinic Return ? ?Assessment: ?Andrew Macomber Sr. is a 46 y.o. male with the following: ?Acute left shoulder pain, with small, full-thickness rotator cuff tear. ? ?Plan: ?Prior injection did not provide relief of his symptoms.  MRI demonstrates a small, approximately 8 mm full-thickness tear in the lateral aspect of the supraspinatus.  There is no retraction.  No atrophy is appreciated.  He is not interested in surgery at this time.  I think it is reasonable to try physical therapy.  We have placed a referral today.  This will be coordinated with Worker's Compensation.  Otherwise, he should continue with medications as needed.  I will see him back in 2 months for reevaluation. ? ?Follow-up: ?Return in about 2 months (around 05/01/2022). ? ? ?Subjective: ? ?Chief Complaint  ?Patient presents with  ? Shoulder Pain  ?  Lt shoulder  ? ? ?History of Present Illness: ?Andrew Everts Ballon Sr. is a 46 y.o. male who returns to clinic for repeat evaluation of left shoulder pain.  Briefly, he sustained a fall, while at work a little over a month ago.  He was some in clinic, and injected his left shoulder.  This provided no relief.  He follow-up with my partner, Dr. Luna Glasgow, who recommended an MRI.  He continues to have pain in the anterior aspect of the left shoulder.  He has difficulty with overhead motion.  Medications are not providing sustained relief. ? ?Review of Systems: ?No fevers or chills ?No numbness or tingling ?No chest pain ?No shortness of breath ?No bowel or bladder dysfunction ?No GI distress ?No headaches ? ? ?Objective: ?Ht '5\' 9"'$  (1.753 m)   Wt 185 lb (83.9 kg)   BMI 27.32 kg/m?  ? ?Physical Exam: ? ?Alert and oriented.  No acute distress. ? ?Left shoulder without deformity.  Tenderness palpation along the superior aspect of the scapular spine.  Forward elevation to 120 degrees.  Internal rotation to his lumbar spine, but this is uncomfortable.  Tenderness to palpation of the anterior shoulder.   Negative belly press. ? ?IMAGING: ?I personally ordered and reviewed the following images: ? ?MRI completed at an outside facility.  There is a small, 8 mm tear in the anterior aspect of the supraspinatus, without retraction.  No atrophy is appreciated. ? ?Mordecai Rasmussen, MD ?03/01/2022 ?1:44 PM ? ? ?

## 2022-03-01 NOTE — Patient Instructions (Signed)
PT ? ?Out of work until next visit ?

## 2022-03-06 ENCOUNTER — Telehealth: Payer: Self-pay

## 2022-03-06 NOTE — Telephone Encounter (Signed)
W/C nurse case manager Kristen left message asking for office notes and PT order for this patient to be faxed to [phone]. ?

## 2022-03-07 NOTE — Telephone Encounter (Signed)
Faxed to her via release. ? ?

## 2022-05-08 ENCOUNTER — Encounter: Payer: Self-pay | Admitting: Orthopedic Surgery

## 2022-05-08 ENCOUNTER — Ambulatory Visit (INDEPENDENT_AMBULATORY_CARE_PROVIDER_SITE_OTHER): Payer: Worker's Compensation | Admitting: Orthopedic Surgery

## 2022-05-08 VITALS — BP 127/77 | HR 86 | Ht 68.0 in | Wt 185.0 lb

## 2022-05-08 DIAGNOSIS — S46012D Strain of muscle(s) and tendon(s) of the rotator cuff of left shoulder, subsequent encounter: Secondary | ICD-10-CM

## 2022-05-08 NOTE — Patient Instructions (Addendum)
Out of work for a month - please provide a letter  Focus on rehab and getting stronger

## 2022-05-08 NOTE — Progress Notes (Signed)
Orthopaedic Clinic Return  Assessment: Andrew Kissner Sr. is a 46 y.o. male with the following: Acute left shoulder pain, with small, full-thickness rotator cuff tear.  Plan: Andrew Gillespie continues to have some pain and limitation with his left shoulder.  He has completed 6 weeks of physical therapy.  We discussed surgery in great detail, including the recovery.  I have stressed to him the importance of working on strengthening, as this could improve his overall symptoms.  He is not yet ready to consider surgery.  He will continue to work on home exercises.  I will see him in 1 month.  Provided a letter for work, excusing him from full duty.  He states that he has subsequently lost disposition with the company, and is trying to figure out his neck steps.  I will see him in 1 month, at which time we can discuss plans and possibility of proceeding with surgery.  Follow-up: Return in about 4 weeks (around 06/05/2022).   Subjective:  Chief Complaint  Patient presents with   Shoulder Injury    Left, workers comp -doing better therapy haas helped with range    History of Present Illness: Andrew Dishman Sr. is a 46 y.o. male who returns to clinic for repeat evaluation of left shoulder pain.  He has been working with physical therapy.  He has been doing this for the past 6 weeks.  His range of motion is considerably improved.  However, he does have some weakness and ongoing discomfort in his left shoulder.  He states he does not have a job, but is still covered by [employer].  He is not sure what is happening with his prior position.  He is a little bit frustrated with his limited improvement thus far, but is not yet ready to consider surgery.   Review of Systems: No fevers or chills No numbness or tingling No chest pain No shortness of breath No bowel or bladder dysfunction No GI distress No headaches   Objective: BP 127/77   Pulse 86   Ht '5\' 8"'$  (1.727 m)   Wt 185 lb (83.9  kg)   BMI 28.13 kg/m   Physical Exam:  Alert and oriented.  No acute distress.  Left shoulder without deformity.  Forward elevation to 150 degrees.  Internal rotation to his lumbar spine.  Tenderness to palpation of the anterior shoulder.  Negative belly press.  4/5 strength in the supraspinatus, infraspinatus and deltoid.  IMAGING: I personally ordered and reviewed the following images:  MRI completed at an outside facility.  There is a small, 8 mm tear in the anterior aspect of the supraspinatus, without retraction.  No atrophy is appreciated.  Mordecai Rasmussen, MD 05/08/2022 10:58 AM

## 2022-05-10 ENCOUNTER — Telehealth: Payer: Self-pay | Admitting: Orthopedic Surgery

## 2022-05-10 ENCOUNTER — Ambulatory Visit: Payer: Self-pay | Admitting: Orthopedic Surgery

## 2022-05-10 NOTE — Telephone Encounter (Signed)
Request per call from [employer], ph [phone] / fax [phone], Octavia, requests notes for visit 05/08/22 - done via Release system.

## 2022-06-04 ENCOUNTER — Telehealth: Payer: Self-pay | Admitting: Orthopedic Surgery

## 2022-06-04 ENCOUNTER — Encounter: Payer: Self-pay | Admitting: Orthopedic Surgery

## 2022-06-04 ENCOUNTER — Ambulatory Visit (INDEPENDENT_AMBULATORY_CARE_PROVIDER_SITE_OTHER): Payer: Worker's Compensation | Admitting: Orthopedic Surgery

## 2022-06-04 DIAGNOSIS — S46012D Strain of muscle(s) and tendon(s) of the rotator cuff of left shoulder, subsequent encounter: Secondary | ICD-10-CM

## 2022-06-04 NOTE — Telephone Encounter (Signed)
Call from patient following office visit today, 06/04/22; states spoke with nurse case manager, and also his attorney.  Patient is requested, when notes are available (including work note, which we will need to know approximate date to extend if he is to continue out of work) - to please send to nurse case manager, Deberah Pelton, [phone] /  fax [phone], email: [email or web address]

## 2022-06-04 NOTE — Patient Instructions (Signed)
Medical clearance prior to scheduling surgery  Will obtain authorization through worker's compensation

## 2022-06-04 NOTE — Progress Notes (Signed)
Orthopaedic Clinic Return  Assessment: Andrew Wiederholt Sr. is a 46 y.o. male with the following: Acute left shoulder pain, with small, full-thickness rotator cuff tear.  Plan: Andrew Gillespie continues to have some pain and limitation with his left shoulder.  He has had a chance to consider surgery.  The procedure and recovery were discussed at great lengths.  Given the lack of improvement, and the MRI documenting a small rotator cuff tear, he would like to proceed with surgery.  All questions were answered.  We will have to obtain authorization through [employer].  In addition, he has not seen a primary care doctor in several years.  He does smoke upwards of 1 pack of cigarettes per day, and so I think it is prudent for him to obtain medical clearance prior to surgery.  We will coordinate this, and plan to get him scheduled for surgery as soon as we have clearance and authorization with Worker's Compensation.  Follow-up: Return for After medical clearance for OR.   Subjective:  Chief Complaint  Patient presents with   Shoulder Pain    LT shoulder/ follow up DOI 03/22/22 Pain level depends on the day    History of Present Illness: Andrew Hartje Sr. is a 46 y.o. male who returns to clinic for repeat evaluation of left shoulder pain.  He has had pain since May, in his left shoulder.  He has remained constant.  He tolerates gentle range of motion, but has limited function.  After discussing potential treatment options at the last visit, he is interested in considering surgery at this time.   Review of Systems: No fevers or chills No numbness or tingling No chest pain No shortness of breath No bowel or bladder dysfunction No GI distress No headaches   Objective: There were no vitals taken for this visit.  Physical Exam:  Alert and oriented.  No acute distress.  Left shoulder without deformity.  Forward elevation to 150 degrees.  Internal rotation to his lumbar spine.   Tenderness to palpation of the anterior shoulder.  Negative belly press.  4/5 strength in the supraspinatus, infraspinatus and deltoid.  Fingers warm and well-perfused.  2+ radial pulse.  IMAGING: I personally ordered and reviewed the following images:  MRI completed at an outside facility.  There is a small, 8 mm tear in the anterior aspect of the supraspinatus, without retraction.  No atrophy is appreciated.  Andrew Rasmussen, MD 06/04/2022 2:06 PM

## 2022-06-05 ENCOUNTER — Encounter: Payer: Self-pay | Admitting: Orthopedic Surgery

## 2022-07-01 ENCOUNTER — Telehealth: Payer: Self-pay | Admitting: Orthopedic Surgery

## 2022-07-01 NOTE — Telephone Encounter (Signed)
Call received from patient's worker's comp nurse case manager Deberah Pelton, ph# [phone] / fax# [phone] - checking on status of surgery being scheduled. States was last made aware that we may be able to assist with locating a primary care doctor, being that patient does not have one.  Surgery pending clearance?  Please advise.

## 2022-07-03 NOTE — Telephone Encounter (Signed)
Called pt and advised that he speak with his attorney on what steps to take for clearance. Pt was given # to local PCP office that's accepting pt to schedule appointment for clearance.

## 2022-07-12 ENCOUNTER — Ambulatory Visit (INDEPENDENT_AMBULATORY_CARE_PROVIDER_SITE_OTHER): Payer: BC Managed Care – PPO | Admitting: Internal Medicine

## 2022-07-12 ENCOUNTER — Encounter: Payer: Self-pay | Admitting: Internal Medicine

## 2022-07-12 VITALS — BP 108/70 | HR 88 | Ht 68.0 in | Wt 201.0 lb

## 2022-07-12 DIAGNOSIS — Z2821 Immunization not carried out because of patient refusal: Secondary | ICD-10-CM | POA: Diagnosis not present

## 2022-07-12 DIAGNOSIS — Z0000 Encounter for general adult medical examination without abnormal findings: Secondary | ICD-10-CM | POA: Diagnosis not present

## 2022-07-12 DIAGNOSIS — S46012S Strain of muscle(s) and tendon(s) of the rotator cuff of left shoulder, sequela: Secondary | ICD-10-CM

## 2022-07-12 DIAGNOSIS — F3181 Bipolar II disorder: Secondary | ICD-10-CM

## 2022-07-12 DIAGNOSIS — M75102 Unspecified rotator cuff tear or rupture of left shoulder, not specified as traumatic: Secondary | ICD-10-CM | POA: Insufficient documentation

## 2022-07-12 DIAGNOSIS — Z8659 Personal history of other mental and behavioral disorders: Secondary | ICD-10-CM

## 2022-07-12 DIAGNOSIS — K219 Gastro-esophageal reflux disease without esophagitis: Secondary | ICD-10-CM

## 2022-07-12 DIAGNOSIS — Z0001 Encounter for general adult medical examination with abnormal findings: Secondary | ICD-10-CM

## 2022-07-12 DIAGNOSIS — F321 Major depressive disorder, single episode, moderate: Secondary | ICD-10-CM

## 2022-07-12 DIAGNOSIS — Z720 Tobacco use: Secondary | ICD-10-CM

## 2022-07-12 NOTE — Assessment & Plan Note (Signed)
He is a current everyday smoker, smoking 1 pack of cigarettes daily.  He has been taking for the last 21 years.  Currently contemplating cessation, but has not chosen a quit date.  States that he previously quit for 7 months, but then restarted smoking following the death of his father.  His father's birthday is next week and he states that that would not be a good time to stop smoking. -Congratulated him on taking initial steps toward cessation and encouraged him to choose a quit date.  He will contact our office once a quit date is chosen in we will prescribe NRT - Reviewed strategies to maximize success, including removing cigarettes and smoking materials from environment, stress management, substitution of other forms of reinforcement, support of family/friends and written materials.

## 2022-07-12 NOTE — Assessment & Plan Note (Addendum)
Previously followed by psychiatry.  Cannot recall prior medications.  Today he would like to reestablish care with psychiatry for management of BPD 2, PTSD, anxiety/depression -Psychiatry referral placed

## 2022-07-12 NOTE — Patient Instructions (Signed)
It was a pleasure to see you today.  Thank you for giving [place] the opportunity to be involved in your care.  Below is a brief recap of your visit and next steps.  We will plan to see you again in 3 months  Summary We will check basic labs today I have referred you to psychiatry and gastroenterology as we discussed You are at a low surgical risk and should be able to proceed with surgery  Next steps Follow up in 3 months I will notify you of lab results early next week I recommend you choose a quit date to stop smoking

## 2022-07-12 NOTE — Assessment & Plan Note (Addendum)
He states that he was previously prescribed Protonix, which did not completely relieve his symptoms.  Since losing insurance he has been purchasing PPIs over-the-counter but notes refractory GERD symptoms.  He also endorses dysphagia and occasional odynophagia, stating that he periodically has to regurgitate his food.  This is mostly associated with solids. He denies unintentional weight loss, fever/chills, melena/hematochezia. -In the setting of refractory GERD associated with dysphagia/odynophagia, I referred him to gastroenterology for evaluation

## 2022-07-12 NOTE — Assessment & Plan Note (Signed)
Presenting today to establish care -Basic labs ordered today, including one-time screenings for HIV/HCV -Referred to GI for screening colonoscopy -Refused COVID and influenza vaccines today

## 2022-07-12 NOTE — Progress Notes (Signed)
New Patient Office Visit  Subjective    Patient ID: Andrew Bertone Sr., male    DOB: [birth year]  Age: 46 y.o. MRN: [id number]  CC:  Chief Complaint  Patient presents with   Establish Care   HPI [facility] Sr[facility] presents to establish care.  He is a 46 year old male with a past medical history significant for tobacco use, GERD, BPD 2, PTSD, anxiety/depression, and left rotator cuff tear.  He has not had a primary care provider in many years.  Andrew Gillespie endorses chronic left shoulder pain today but is otherwise asymptomatic.  His acute concern today is related to a small tear in his left rotator cuff that will require surgical repair.  He is followed by Dr. Amedeo Kinsman with [facility], who has requested that he be evaluated for surgical clearance before undergoing surgery.  Andrew Gillespie also notes that he was previously followed by psychiatry for BPD 2, PTSD, and anxiety/depression.  He is not currently on any medications and is no longer followed by a psychiatrist after losing insurance temporarily.  Now that he has regained insurance, he would like to reestablish care and request a new referral today.  Acute concern of chronic medical issues discussed today are individually dressed in A/P below.  No outpatient encounter medications on file as of 07/12/2022.   No facility-administered encounter medications on file as of 07/12/2022.   Past Medical History:  Diagnosis Date   Hypoglycemia    Past Surgical History:  Procedure Laterality Date   APPENDECTOMY     HERNIA REPAIR     Family History  Problem Relation Age of Onset   Diabetes Maternal Grandfather    Heart attack Maternal Grandfather    Congestive Heart Failure Maternal Grandfather    Heart attack Paternal Grandfather    Diabetes Mother    Mental illness Mother    Diabetes Father    Heart attack Father    Mental illness Father    Diabetes Sister    Mental illness Brother    Diabetes Maternal Grandmother    Stroke Maternal  Grandmother    Heart attack Paternal Grandmother    Social History   Socioeconomic History   Marital status: Married    Spouse name: Not on file   Number of children: Not on file   Years of education: Not on file   Highest education level: Not on file  Occupational History   Not on file  Tobacco Use   Smoking status: Every Day    Packs/day: 1.00    Types: Cigarettes    Start date: 11/04/1988   Smokeless tobacco: Former    Quit date: 11/13/2012  Vaping Use   Vaping Use: Never used  Substance and Sexual Activity   Alcohol use: No   Drug use: No   Sexual activity: Not on file  Other Topics Concern   Not on file  Social History Narrative   Not on file   Social Determinants of Health   Financial Resource Strain: Not on file  Food Insecurity: Not on file  Transportation Needs: Not on file  Physical Activity: Not on file  Stress: Not on file  Social Connections: Not on file  Intimate Partner Violence: Not on file   Review of Systems  Constitutional:  Negative for chills and fever.  HENT:  Negative for sore throat.   Respiratory:  Negative for cough and shortness of breath.   Cardiovascular:  Negative for chest pain, palpitations and leg swelling.  Gastrointestinal:  Negative for abdominal pain, blood in stool, constipation, diarrhea, nausea and vomiting.  Genitourinary:  Negative for dysuria and hematuria.  Musculoskeletal:  Negative for myalgias.       Chronic L shoulder pain  Skin:  Negative for itching and rash.  Neurological:  Negative for dizziness and headaches.  Psychiatric/Behavioral:  Negative for depression, hallucinations and suicidal ideas. The patient is nervous/anxious.    Objective    BP 108/70   Pulse 88   Ht '5\' 8"'$  (1.727 m)   Wt 201 lb (91.2 kg)   SpO2 96%   BMI 30.56 kg/m   Physical Exam Vitals reviewed.  Constitutional:      General: He is not in acute distress.    Appearance: Normal appearance. He is not ill-appearing.  HENT:     Head:  Normocephalic and atraumatic.     Nose: Nose normal. No congestion or rhinorrhea.     Mouth/Throat:     Mouth: Mucous membranes are moist.     Pharynx: Oropharynx is clear.  Eyes:     Extraocular Movements: Extraocular movements intact.     Conjunctiva/sclera: Conjunctivae normal.     Pupils: Pupils are equal, round, and reactive to light.  Cardiovascular:     Rate and Rhythm: Normal rate and regular rhythm.     Pulses: Normal pulses.     Heart sounds: Normal heart sounds. No murmur heard. Pulmonary:     Effort: Pulmonary effort is normal.     Breath sounds: Normal breath sounds. No wheezing, rhonchi or rales.  Abdominal:     General: Abdomen is flat. Bowel sounds are normal. There is no distension.     Palpations: Abdomen is soft.     Tenderness: There is no abdominal tenderness.  Musculoskeletal:     Cervical back: Normal range of motion.  Skin:    General: Skin is warm and dry.     Capillary Refill: Capillary refill takes less than 2 seconds.  Neurological:     General: No focal deficit present.     Mental Status: He is alert and oriented to person, place, and time.     Motor: No weakness.  Psychiatric:        Mood and Affect: Mood normal.        Behavior: Behavior normal.        Thought Content: Thought content normal.     Comments: Endorses anxiety around coming to the doctor's office    Last CBC Lab Results  Component Value Date   WBC 7.9 02/06/2018   HGB 16.1 02/06/2018   HCT 48.0 02/06/2018   MCV 92.7 02/06/2018   MCH 31.1 02/06/2018   RDW 12.5 02/06/2018   PLT 243 37/90/2409   Last metabolic panel Lab Results  Component Value Date   GLUCOSE 94 02/06/2018   NA 138 02/06/2018   K 3.8 02/06/2018   CL 106 02/06/2018   CO2 21 (L) 02/06/2018   BUN 10 02/06/2018   CREATININE 0.77 02/06/2018   GFRNONAA >60 02/06/2018   CALCIUM 9.3 02/06/2018   PROT 7.3 02/06/2018   ALBUMIN 4.3 02/06/2018   BILITOT 0.6 02/06/2018   ALKPHOS 96 02/06/2018   AST 18  02/06/2018   ALT 25 02/06/2018   ANIONGAP 11 02/06/2018   Last lipids Lab Results  Component Value Date   CHOL 212 (H) 08/03/2015   HDL 39 (L) 08/03/2015   LDLCALC 102 (H) 08/03/2015   TRIG 355 (H) 08/03/2015   CHOLHDL 5.4 (H) 08/03/2015  Last hemoglobin A1c Lab Results  Component Value Date   HGBA1C 4.7 02/10/2017   Last thyroid functions Lab Results  Component Value Date   TSH 2.150 08/03/2015    Assessment & Plan:   Problem List Items Addressed This Visit       Esophageal reflux    He states that he was previously prescribed Protonix, which did not completely relieve his symptoms.  Since losing insurance he has been purchasing PPIs over-the-counter but notes refractory GERD symptoms.  He also endorses dysphagia and occasional odynophagia, stating that he periodically has to regurgitate his food.  This is mostly associated with solids. He denies unintentional weight loss, fever/chills, melena/hematochezia. -In the setting of refractory GERD associated with dysphagia/odynophagia, I referred him to gastroenterology for evaluation      Relevant Orders   Fe+TIBC+Fer   Ambulatory referral to Gastroenterology     Left rotator cuff tear     Traumatic, complete tear of left rotator cuff.  He is followed by Dr. Amedeo Kinsman at [facility], he was planning for surgical repair.  He is presenting today, largely to establish care and to receive surgical clearance. -RCRI 0 (class I risk).  There is a 3.9% 30-day risk of death, MI, or cardiac arrest associated with surgery -Andrew Gillespie is at a low risk for perioperative complications and should be able to proceed with surgery when scheduled.      Bipolar 2 disorder, major depressive episode [facility])    Previously followed by psychiatry.  Cannot recall prior medications.  Today he would like to reestablish care with psychiatry for management of BPD 2, PTSD, anxiety/depression -Psychiatry referral placed      Relevant Orders   Ambulatory  referral to Psychiatry   Tobacco use    He is a current everyday smoker, smoking 1 pack of cigarettes daily.  He has been taking for the last 21 years.  Currently contemplating cessation, but has not chosen a quit date.  States that he previously quit for 7 months, but then restarted smoking following the death of his father.  His father's birthday is next week and he states that that would not be a good time to stop smoking. -Congratulated him on taking initial steps toward cessation and encouraged him to choose a quit date.  He will contact our office once a quit date is chosen in we will prescribe NRT - Reviewed strategies to maximize success, including removing cigarettes and smoking materials from environment, stress management, substitution of other forms of reinforcement, support of family/friends and written materials.      Preventative health care    Presenting today to establish care -Basic labs ordered today, including one-time screenings for HIV/HCV -Referred to GI for screening colonoscopy -Refused COVID and influenza vaccines today      Return in about 3 months (around 10/11/2022).   Johnette Abraham, MD

## 2022-07-12 NOTE — Assessment & Plan Note (Signed)
Traumatic, complete tear of left rotator cuff.  He is followed by Dr. Amedeo Kinsman at [facility], he was planning for surgical repair.  He is presenting today, largely to establish care and to receive surgical clearance. -RCRI 0 (class I risk).  There is a 3.9% 30-day risk of death, MI, or cardiac arrest associated with surgery -Mr. Viverette is at a low risk for perioperative complications and should be able to proceed with surgery when scheduled.

## 2022-07-13 LAB — CBC WITH DIFFERENTIAL/PLATELET
Basophils Absolute: 0 10*3/uL (ref 0.0–0.2)
Basos: 1 %
EOS (ABSOLUTE): 0.1 10*3/uL (ref 0.0–0.4)
Eos: 2 %
Hematocrit: 46.6 % (ref 37.5–51.0)
Hemoglobin: 16.1 g/dL (ref 13.0–17.7)
Immature Grans (Abs): 0 10*3/uL (ref 0.0–0.1)
Immature Granulocytes: 0 %
Lymphocytes Absolute: 0.9 10*3/uL (ref 0.7–3.1)
Lymphs: 14 %
MCH: 31.6 pg (ref 26.6–33.0)
MCHC: 34.5 g/dL (ref 31.5–35.7)
MCV: 91 fL (ref 79–97)
Monocytes Absolute: 0.5 10*3/uL (ref 0.1–0.9)
Monocytes: 7 %
Neutrophils Absolute: 4.9 10*3/uL (ref 1.4–7.0)
Neutrophils: 76 %
Platelets: 217 10*3/uL (ref 150–450)
RBC: 5.1 x10E6/uL (ref 4.14–5.80)
RDW: 12.1 % (ref 11.6–15.4)
WBC: 6.4 10*3/uL (ref 3.4–10.8)

## 2022-07-13 LAB — CMP14+EGFR
ALT: 27 IU/L (ref 0–44)
AST: 13 IU/L (ref 0–40)
Albumin/Globulin Ratio: 1.8 (ref 1.2–2.2)
Albumin: 4.6 g/dL (ref 4.1–5.1)
Alkaline Phosphatase: 123 IU/L — ABNORMAL HIGH (ref 44–121)
BUN/Creatinine Ratio: 8 — ABNORMAL LOW (ref 9–20)
BUN: 8 mg/dL (ref 6–24)
Bilirubin Total: 0.3 mg/dL (ref 0.0–1.2)
CO2: 21 mmol/L (ref 20–29)
Calcium: 9.7 mg/dL (ref 8.7–10.2)
Chloride: 107 mmol/L — ABNORMAL HIGH (ref 96–106)
Creatinine, Ser: 0.96 mg/dL (ref 0.76–1.27)
Globulin, Total: 2.6 g/dL (ref 1.5–4.5)
Glucose: 93 mg/dL (ref 70–99)
Potassium: 4.3 mmol/L (ref 3.5–5.2)
Sodium: 143 mmol/L (ref 134–144)
Total Protein: 7.2 g/dL (ref 6.0–8.5)
eGFR: 99 mL/min/{1.73_m2} (ref >59)

## 2022-07-13 LAB — IRON,TIBC AND FERRITIN PANEL
Ferritin: 352 ng/mL (ref 30–400)
Iron Saturation: 18 % (ref 15–55)
Iron: 58 ug/dL (ref 38–169)
Total Iron Binding Capacity: 321 ug/dL (ref 250–450)
UIBC: 263 ug/dL (ref 111–343)

## 2022-07-13 LAB — B12 AND FOLATE PANEL
Folate: 9.6 ng/mL (ref >3.0)
Vitamin B-12: 514 pg/mL (ref 232–1245)

## 2022-07-13 LAB — HEMOGLOBIN A1C
Est. average glucose Bld gHb Est-mCnc: 105 mg/dL
Hgb A1c MFr Bld: 5.3 % (ref 4.8–5.6)

## 2022-07-13 LAB — LIPID PANEL
Chol/HDL Ratio: 6.3 ratio — ABNORMAL HIGH (ref 0.0–5.0)
Cholesterol, Total: 247 mg/dL — ABNORMAL HIGH (ref 100–199)
HDL: 39 mg/dL — ABNORMAL LOW (ref >39)
LDL Chol Calc (NIH): 131 mg/dL — ABNORMAL HIGH (ref 0–99)
Triglycerides: 427 mg/dL — ABNORMAL HIGH (ref 0–149)
VLDL Cholesterol Cal: 77 mg/dL — ABNORMAL HIGH (ref 5–40)

## 2022-07-13 LAB — HCV INTERPRETATION

## 2022-07-13 LAB — HCV AB W REFLEX TO QUANT PCR: HCV Ab: NONREACTIVE

## 2022-07-13 LAB — HIV ANTIBODY (ROUTINE TESTING W REFLEX): HIV Screen 4th Generation wRfx: NONREACTIVE

## 2022-07-15 ENCOUNTER — Other Ambulatory Visit: Payer: Self-pay | Admitting: Internal Medicine

## 2022-07-15 ENCOUNTER — Encounter: Payer: Self-pay | Admitting: *Deleted

## 2022-07-15 DIAGNOSIS — E785 Hyperlipidemia, unspecified: Secondary | ICD-10-CM

## 2022-07-15 MED ORDER — ATORVASTATIN CALCIUM 20 MG PO TABS: 20.0000 mg | ORAL_TABLET | Freq: Every day | ORAL | 3 refills | Status: DC

## 2022-07-15 NOTE — Telephone Encounter (Signed)
Call received from patient - relays has seen his new primary care provider at [facility]; states he was cleared for surgery. States he also messaged his nurse case Freight forwarder with worker's comp. Both are asking if  Dr Amedeo Kinsman has received the letter of clearance.  Patient's phone # [phone].   Case manager Deberah Pelton, ph# [phone] / fax# [phone] per previous phone note.

## 2022-07-17 NOTE — Telephone Encounter (Signed)
Letter sent to PCP

## 2022-07-24 ENCOUNTER — Telehealth: Payer: Self-pay | Admitting: Orthopedic Surgery

## 2022-07-24 ENCOUNTER — Other Ambulatory Visit: Payer: Self-pay

## 2022-07-24 DIAGNOSIS — S46012D Strain of muscle(s) and tendon(s) of the rotator cuff of left shoulder, subsequent encounter: Secondary | ICD-10-CM

## 2022-07-24 NOTE — Telephone Encounter (Signed)
Patient called back in response to Leta's call on 07/23/22 regarding surgery, for which a faxed approval was received from worker's comp.  Patient states out of the 2 dates mentioned on message, he would like to schedule surgery on 08/05/22.  Relayed we can do note once patient receives Leta's call back in order for his work note to be issued.

## 2022-07-30 ENCOUNTER — Ambulatory Visit (INDEPENDENT_AMBULATORY_CARE_PROVIDER_SITE_OTHER): Payer: BC Managed Care – PPO | Admitting: Gastroenterology

## 2022-07-30 ENCOUNTER — Encounter: Payer: Self-pay | Admitting: Gastroenterology

## 2022-07-30 VITALS — BP 113/70 | HR 82 | Temp 97.5°F | Ht 68.0 in | Wt 198.4 lb

## 2022-07-30 DIAGNOSIS — R1319 Other dysphagia: Secondary | ICD-10-CM

## 2022-07-30 DIAGNOSIS — Z8371 Family history of colonic polyps: Secondary | ICD-10-CM

## 2022-07-30 DIAGNOSIS — Z83719 Family history of colon polyps, unspecified: Secondary | ICD-10-CM | POA: Insufficient documentation

## 2022-07-30 DIAGNOSIS — K219 Gastro-esophageal reflux disease without esophagitis: Secondary | ICD-10-CM

## 2022-07-30 DIAGNOSIS — R748 Abnormal levels of other serum enzymes: Secondary | ICD-10-CM | POA: Diagnosis not present

## 2022-07-30 DIAGNOSIS — R1310 Dysphagia, unspecified: Secondary | ICD-10-CM | POA: Insufficient documentation

## 2022-07-30 MED ORDER — PANTOPRAZOLE SODIUM 40 MG PO TBEC: 40.0000 mg | DELAYED_RELEASE_TABLET | Freq: Two times a day (BID) | ORAL | 3 refills | Status: DC

## 2022-07-30 NOTE — Patient Instructions (Signed)
Start pantoprazole '40mg'$  twice daily before breakfast and evening meal. RX sent to [facility]. Recommend upper endoscopy and colonoscopy once you are released by your surgeon to complete this. Please call when you are ready to proceed and we will get you scheduled. Otherwise, we will touch base with you in 6-8 weeks.  Your one liver number was slightly elevated, possibly because of not fasting before the test or due to your shoulder injury. We will plan to recheck it in 4 months.

## 2022-07-30 NOTE — Patient Instructions (Addendum)
Your procedure is scheduled on: 08/05/2022  Report to [facility] at   6:00  AM.  Call this number if you have problems the morning of surgery: [phone]   Remember:   Do not Eat after midnight            You may have CLEAR liquids until 3:30 am         Drink the Carb loading drink at 3:15 am         No Smoking the morning of surgery  :  Take these medicines the morning of surgery with A SIP OF WATER: Pantoprazole   Do not wear jewelry, make-up or nail polish.  Do not wear lotions, powders, or perfumes. You may wear deodorant.  Do not shave 48 hours prior to surgery. Men may shave face and neck.  Do not bring valuables to the hospital.  Contacts, dentures or bridgework may not be worn into surgery.  Leave suitcase in the car. After surgery it may be brought to your room.  For patients admitted to the hospital, checkout time is 11:00 AM the day of discharge.   Patients discharged the day of surgery will not be allowed to drive home.    Special Instructions: Shower using CHG night before surgery and shower the day of surgery use CHG.  Use special wash - you have one bottle of CHG for all showers.  You should use approximately 1/2 of the bottle for each shower.  How to Use Chlorhexidine Before Surgery Chlorhexidine gluconate (CHG) is a germ-killing (antiseptic) solution that is used to clean the skin. It can get rid of the bacteria that normally live on the skin and can keep them away for about 24 hours. To clean your skin with CHG, you may be given: A CHG solution to use in the shower or as part of a sponge bath. A prepackaged cloth that contains CHG. Cleaning your skin with CHG may help lower the risk for infection: While you are staying in the intensive care unit of the hospital. If you have a vascular access, such as a central line, to provide short-term or long-term access to your veins. If you have a catheter to drain urine from your bladder. If you are on a  ventilator. A ventilator is a machine that helps you breathe by moving air in and out of your lungs. After surgery. What are the risks? Risks of using CHG include: A skin reaction. Hearing loss, if CHG gets in your ears and you have a perforated eardrum. Eye injury, if CHG gets in your eyes and is not rinsed out. The CHG product catching fire. Make sure that you avoid smoking and flames after applying CHG to your skin. Do not use CHG: If you have a chlorhexidine allergy or have previously reacted to chlorhexidine. On babies younger than 80 months of age. How to use CHG solution Use CHG only as told by your health care provider, and follow the instructions on the label. Use the full amount of CHG as directed. Usually, this is one bottle. During a shower Follow these steps when using CHG solution during a shower (unless your health care provider gives you different instructions): Start the shower. Use your normal soap and shampoo to wash your face and hair. Turn off the shower or move out of the shower stream. Pour the CHG onto a clean washcloth. Do not use any type of brush or rough-edged sponge. Starting at your neck, lather your  body down to your toes. Make sure you follow these instructions: If you will be having surgery, pay special attention to the part of your body where you will be having surgery. Scrub this area for at least 1 minute. Do not use CHG on your head or face. If the solution gets into your ears or eyes, rinse them well with water. Avoid your genital area. Avoid any areas of skin that have broken skin, cuts, or scrapes. Scrub your back and under your arms. Make sure to wash skin folds. Let the lather sit on your skin for 1-2 minutes or as long as told by your health care provider. Thoroughly rinse your entire body in the shower. Make sure that all body creases and crevices are rinsed well. Dry off with a clean towel. Do not put any substances on your body afterward--such  as powder, lotion, or perfume--unless you are told to do so by your health care provider. Only use lotions that are recommended by the manufacturer. Put on clean clothes or pajamas. If it is the night before your surgery, sleep in clean sheets.  During a sponge bath Follow these steps when using CHG solution during a sponge bath (unless your health care provider gives you different instructions): Use your normal soap and shampoo to wash your face and hair. Pour the CHG onto a clean washcloth. Starting at your neck, lather your body down to your toes. Make sure you follow these instructions: If you will be having surgery, pay special attention to the part of your body where you will be having surgery. Scrub this area for at least 1 minute. Do not use CHG on your head or face. If the solution gets into your ears or eyes, rinse them well with water. Avoid your genital area. Avoid any areas of skin that have broken skin, cuts, or scrapes. Scrub your back and under your arms. Make sure to wash skin folds. Let the lather sit on your skin for 1-2 minutes or as long as told by your health care provider. Using a different clean, wet washcloth, thoroughly rinse your entire body. Make sure that all body creases and crevices are rinsed well. Dry off with a clean towel. Do not put any substances on your body afterward--such as powder, lotion, or perfume--unless you are told to do so by your health care provider. Only use lotions that are recommended by the manufacturer. Put on clean clothes or pajamas. If it is the night before your surgery, sleep in clean sheets. How to use CHG prepackaged cloths Only use CHG cloths as told by your health care provider, and follow the instructions on the label. Use the CHG cloth on clean, dry skin. Do not use the CHG cloth on your head or face unless your health care provider tells you to. When washing with the CHG cloth: Avoid your genital area. Avoid any areas of skin  that have broken skin, cuts, or scrapes. Before surgery Follow these steps when using a CHG cloth to clean before surgery (unless your health care provider gives you different instructions): Using the CHG cloth, vigorously scrub the part of your body where you will be having surgery. Scrub using a back-and-forth motion for 3 minutes. The area on your body should be completely wet with CHG when you are done scrubbing. Do not rinse. Discard the cloth and let the area air-dry. Do not put any substances on the area afterward, such as powder, lotion, or perfume. Put on clean clothes  or pajamas. If it is the night before your surgery, sleep in clean sheets.  For general bathing Follow these steps when using CHG cloths for general bathing (unless your health care provider gives you different instructions). Use a separate CHG cloth for each area of your body. Make sure you wash between any folds of skin and between your fingers and toes. Wash your body in the following order, switching to a new cloth after each step: The front of your neck, shoulders, and chest. Both of your arms, under your arms, and your hands. Your stomach and groin area, avoiding the genitals. Your right leg and foot. Your left leg and foot. The back of your neck, your back, and your buttocks. Do not rinse. Discard the cloth and let the area air-dry. Do not put any substances on your body afterward--such as powder, lotion, or perfume--unless you are told to do so by your health care provider. Only use lotions that are recommended by the manufacturer. Put on clean clothes or pajamas. Contact a health care provider if: Your skin gets irritated after scrubbing. You have questions about using your solution or cloth. You swallow any chlorhexidine. Call your local poison control center (1-(726) 114-4245 in the [place] Get help right away if: Your eyes itch badly, or they become very red or swollen. Your skin itches badly and is red or  swollen. Your hearing changes. You have trouble seeing. You have swelling or tingling in your mouth or throat. You have trouble breathing. These symptoms may represent a serious problem that is an emergency. Do not wait to see if the symptoms will go away. Get medical help right away. Call your local emergency services (911 in the [place] Do not drive yourself to the hospital. Summary Chlorhexidine gluconate (CHG) is a germ-killing (antiseptic) solution that is used to clean the skin. Cleaning your skin with CHG may help to lower your risk for infection. You may be given CHG to use for bathing. It may be in a bottle or in a prepackaged cloth to use on your skin. Carefully follow your health care provider's instructions and the instructions on the product label. Do not use CHG if you have a chlorhexidine allergy. Contact your health care provider if your skin gets irritated after scrubbing. This information is not intended to replace advice given to you by your health care provider. Make sure you discuss any questions you have with your health care provider. Document Revised: 02/18/2022 Document Reviewed: 01/01/2021 Elsevier Patient Education  [place]. Surgery for Rotator Cuff Tear, Care After This sheet gives you information about how to care for yourself after your procedure. Your health care provider may also give you more specific instructions. If you have problems or questions, contact your health care provider. What can I expect after the procedure? After the procedure, it is common to have: Redness. Swelling. A small amount of fluid or blood in the incision area. Pain. Stiffness. Follow these instructions at home: If you have a sling or a shoulder immobilizer: Wear it as told by your health care provider. Remove it only as told by your health care provider. Loosen it if your fingers tingle, become numb, or turn cold and blue. Keep it clean and dry. Bathing Do not take  baths, swim, or use a hot tub until your health care provider approves. Ask your health care provider if you may take showers. You may only be allowed to take sponge baths. Keep your bandage (dressing) dry until your  health care provider says it can be removed. If your sling or shoulder immobilizer is not waterproof: Do not let it get wet. Remove it when you take a bath or shower as told by your health care provider. Once the sling or shoulder immobilizer is removed, try not to move your shoulder until your health care provider says that you can. Incision care  Follow instructions from your health care provider about how to take care of your incision. Make sure you: Wash your hands with soap and water for at least 20 seconds before and after you change your dressing. If soap and water are not available, use hand sanitizer. Change your dressing as told by your health care provider. Leave stitches (sutures), skin glue, or adhesive strips in place. These skin closures may need to stay in place for 2 weeks or longer. If adhesive strip edges start to loosen and curl up, you may trim the loose edges. Do not remove adhesive strips completely unless your health care provider tells you to do that. Check your incision area every day for signs of infection. Check for: More redness, swelling, or pain. More fluid or blood. Warmth. Pus or a bad smell. Managing pain, stiffness, and swelling  If directed, put ice on your shoulder area. To do this: Put ice in a plastic bag. Place a towel between your skin and the bag. Leave the ice on for 20 minutes, 2-3 times a day. Remove the ice if your skin turns bright red. This is very important. If you cannot feel pain, heat, or cold, you have a greater risk of damage to the area. Move your fingers often to reduce stiffness and swelling. Raise (elevate) your upper body on pillows when you lie down and when you sleep. Do not sleep on the front of your body  (abdomen). Do not sleep on the side that your surgery was performed on. Medicines Take over-the-counter and prescription medicines only as told by your health care provider. Ask your health care provider if the medicine prescribed to you: Requires you to avoid driving or using machinery. Can cause constipation. You may need to take these actions to prevent or treat constipation: Drink enough fluid to keep your urine pale yellow. Take over-the-counter or prescription medicines. Eat foods that are high in fiber, such as beans, whole grains, and fresh fruits and vegetables. Limit foods that are high in fat and processed sugars, such as fried or sweet foods. Driving If you were given a sedative during the procedure, it can affect you for several hours. Do not drive or operate machinery until your health care provider says that it is safe. Do not drive while wearing a sling or a shoulder immobilizer. Ask your health care provider when it is safe to drive. Activity Do not use your arm to support your body weight until your health care provider says that you can. Do not lift or hold anything with your arm until your health care provider approves. Return to your normal activities as told by your health care provider. Ask your health care provider what activities are safe for you. Do exercises as told by your health care provider. General instructions Do not use any products that contain nicotine or tobacco, such as cigarettes, e-cigarettes, and chewing tobacco. These can delay healing after surgery. If you need help quitting, ask your health care provider. Keep all follow-up visits. This is important. Contact a health care provider if: You have a fever or chills. You have more  redness, swelling, or pain around your incision. You have more fluid or blood coming from your incision. Your incision feels warm to the touch. You have pus or a bad smell coming from your incision. You have pain that gets  worse or does not get better with medicine. Get help right away if: You have severe pain. You lose feeling in your arm or hand. Your hand or fingers turn very pale or blue. Summary If you have a sling or shoulder immobilizer, wear it as told by your health care provider. Remove it only as told by your health care provider. Change your dressing as told by your health care provider. Check the incision area every day for signs of infection. If directed, put ice on your shoulder area 2-3 times a day. Do not use your arm to lift anything or to support your body weight until your health care provider says that you can. This information is not intended to replace advice given to you by your health care provider. Make sure you discuss any questions you have with your health care provider. Document Revised: 02/23/2020 Document Reviewed: 02/23/2020 Elsevier Patient Education  [place] Anesthesia, Adult, Care After The following information offers guidance on how to care for yourself after your procedure. Your health care provider may also give you more specific instructions. If you have problems or questions, contact your health care provider. What can I expect after the procedure? After the procedure, it is common for people to: Have pain or discomfort at the IV site. Have nausea or vomiting. Have a sore throat or hoarseness. Have trouble concentrating. Feel cold or chills. Feel weak, sleepy, or tired (fatigue). Have soreness and body aches. These can affect parts of the body that were not involved in surgery. Follow these instructions at home: For the time period you were told by your health care provider:  Rest. Do not participate in activities where you could fall or become injured. Do not drive or use machinery. Do not drink alcohol. Do not take sleeping pills or medicines that cause drowsiness. Do not make important decisions or sign legal documents. Do not take care  of children on your own. General instructions Drink enough fluid to keep your urine pale yellow. If you have sleep apnea, surgery and certain medicines can increase your risk for breathing problems. Follow instructions from your health care provider about wearing your sleep device: Anytime you are sleeping, including during daytime naps. While taking prescription pain medicines, sleeping medicines, or medicines that make you drowsy. Return to your normal activities as told by your health care provider. Ask your health care provider what activities are safe for you. Take over-the-counter and prescription medicines only as told by your health care provider. Do not use any products that contain nicotine or tobacco. These products include cigarettes, chewing tobacco, and vaping devices, such as e-cigarettes. These can delay incision healing after surgery. If you need help quitting, ask your health care provider. Contact a health care provider if: You have nausea or vomiting that does not get better with medicine. You vomit every time you eat or drink. You have pain that does not get better with medicine. You cannot urinate or have bloody urine. You develop a skin rash. You have a fever. Get help right away if: You have trouble breathing. You have chest pain. You vomit blood. These symptoms may be an emergency. Get help right away. Call 911. Do not wait to see if the symptoms will  go away. Do not drive yourself to the hospital. Summary After the procedure, it is common to have a sore throat, hoarseness, nausea, vomiting, or to feel weak, sleepy, or fatigue. For the time period you were told by your health care provider, do not drive or use machinery. Get help right away if you have difficulty breathing, have chest pain, or vomit blood. These symptoms may be an emergency. This information is not intended to replace advice given to you by your health care provider. Make sure you discuss any  questions you have with your health care provider. Document Revised: 01/18/2022 Document Reviewed: 01/18/2022 Elsevier Patient Education  [place].

## 2022-07-30 NOTE — Progress Notes (Signed)
GI Office Note    Referring Provider: Johnette Abraham, MD Primary Care Physician:  Johnette Abraham, MD  Primary Gastroenterologist:Charles K. Abbey Chatters, DO   Chief Complaint   Chief Complaint  Patient presents with   Gastroesophageal Reflux     History of Present Illness   Andrew Frazzini Sr. is a 46 y.o. male presenting today with request of Dr. Doren Custard for further evaluation of GERD and to consider colonoscopy.  Patient recently established care with Dr. Doren Custard after he suffered shoulder injury.  Scheduled surgery next week.  Patient has never had an upper endoscopy or colonoscopy.  He has a longstanding history of acid reflux symptoms, solid food dysphagia dating back to in his 14s.  Dysphagia predominantly to meat.  Has had several episodes where food becomes lodged and he has to regurgitate to get relief.  Having frequent heartburn.  Only occasional nocturnal symptoms.  Denies abdominal pain.  He has tried over-the-counter omeprazole and esomeprazole as well as H2 blockers with no significant improvement.  For years he took Zantac which helped but stopped due to recall several years ago.  Patient states his reflux can be bad, aggravated by even water.  Bowel movements are regular.  No blood in the stool or melena.  Family history for colon polyps, father.  He had frequent colonoscopies for surveillance.     Medications   Current Outpatient Medications  Medication Sig Dispense Refill   atorvastatin (LIPITOR) 20 MG tablet Take 1 tablet (20 mg total) by mouth daily. 90 tablet 3   No current facility-administered medications for this visit.    Allergies   Allergies as of 07/30/2022 - Review Complete 07/30/2022  Allergen Reaction Noted   Penicillins Anaphylaxis and Hives 08/17/2011   Dimetapp [albertsons di bromm] Other (See Comments) 08/17/2011   Promethazine hcl Other (See Comments) 08/17/2011   Promethazine hcl Other (See Comments) 08/17/2011   Wellbutrin [bupropion]  Anxiety 09/26/2016    Past Medical History   Past Medical History:  Diagnosis Date   Hypoglycemia     Past Surgical History   Past Surgical History:  Procedure Laterality Date   APPENDECTOMY     HERNIA REPAIR      Past Family History   Family History  Problem Relation Age of Onset   Diabetes Mother    Mental illness Mother    Diabetes Father    Heart attack Father    Mental illness Father    Colon polyps Father        frequent colonoscopies, every 2 years   Diabetes Sister    Mental illness Brother    Diabetes Maternal Grandmother    Stroke Maternal Grandmother    Diabetes Maternal Grandfather    Heart attack Maternal Grandfather    Congestive Heart Failure Maternal Grandfather    Heart attack Paternal Grandmother    Heart attack Paternal Grandfather    Colon cancer Neg Hx     Past Social History   Social History   Socioeconomic History   Marital status: Married    Spouse name: Not on file   Number of children: Not on file   Years of education: Not on file   Highest education level: Not on file  Occupational History   Not on file  Tobacco Use   Smoking status: Every Day    Packs/day: 1.00    Types: Cigarettes    Start date: 11/04/1988   Smokeless tobacco: Former    Quit date: 11/13/2012  Vaping Use   Vaping Use: Never used  Substance and Sexual Activity   Alcohol use: No   Drug use: No   Sexual activity: Yes  Other Topics Concern   Not on file  Social History Narrative   Not on file   Social Determinants of Health   Financial Resource Strain: Not on file  Food Insecurity: Not on file  Transportation Needs: Not on file  Physical Activity: Not on file  Stress: Not on file  Social Connections: Not on file  Intimate Partner Violence: Not on file    Review of Systems   General: Negative for anorexia, weight loss, fever, chills, fatigue, weakness. Eyes: Negative for vision changes.  ENT: Negative for hoarseness,  nasal congestion. See  hpi CV: Negative for chest pain, angina, palpitations, dyspnea on exertion, peripheral edema.  Respiratory: Negative for dyspnea at rest, dyspnea on exertion, cough, sputum, wheezing.  GI: See history of present illness. GU:  Negative for dysuria, hematuria, urinary incontinence, urinary frequency, nocturnal urination.  MS: Negative for low back pain. +left shoulder pain. Derm: Negative for rash or itching.  Neuro: Negative for weakness, abnormal sensation, seizure, frequent headaches, memory loss,  confusion.  Psych: Negative for anxiety, depression, suicidal ideation, hallucinations.  Endo: Negative for unusual weight change.  Heme: Negative for bruising or bleeding. Allergy: Negative for rash or hives.  Physical Exam   BP 113/70 (BP Location: Right Arm, Patient Position: Sitting, Cuff Size: Normal)   Pulse 82   Temp (!) 97.5 F (36.4 C) (Temporal)   Ht '5\' 8"'$  (1.727 m)   Wt 198 lb 6.4 oz (90 kg)   SpO2 97%   BMI 30.17 kg/m    General: Well-nourished, well-developed in no acute distress.  Head: Normocephalic, atraumatic.   Eyes: Conjunctiva pink, no icterus. Mouth: Oropharyngeal mucosa moist and pink , no lesions erythema or exudate. Neck: Supple without thyromegaly, masses, or lymphadenopathy.  Lungs: Clear to auscultation bilaterally.  Heart: Regular rate and rhythm, no murmurs rubs or gallops.  Abdomen: Bowel sounds are normal, nontender, nondistended, no hepatosplenomegaly or masses,  no abdominal bruits or hernia, no rebound or guarding.   Rectal: not performed Extremities: No lower extremity edema. No clubbing or deformities.  Neuro: Alert and oriented x 4 , grossly normal neurologically.  Skin: Warm and dry, no rash or jaundice.   Psych: Alert and cooperative, normal mood and affect.  Labs   Lab Results  Component Value Date   CREATININE 0.96 07/12/2022   BUN 8 07/12/2022   NA 143 07/12/2022   K 4.3 07/12/2022   CL 107 (H) 07/12/2022   CO2 21 07/12/2022    Lab Results  Component Value Date   HGBA1C 5.3 07/12/2022   Lab Results  Component Value Date   WBC 6.4 07/12/2022   HGB 16.1 07/12/2022   HCT 46.6 07/12/2022   MCV 91 07/12/2022   PLT 217 07/12/2022   Lab Results  Component Value Date   ALT 27 07/12/2022   AST 13 07/12/2022   ALKPHOS 123 (H) 07/12/2022   BILITOT 0.3 07/12/2022   Lab Results  Component Value Date   IRON 58 07/12/2022   TIBC 321 07/12/2022   FERRITIN 352 07/12/2022   Lab Results  Component Value Date   VITAMINB12 514 07/12/2022   Lab Results  Component Value Date   FOLATE 9.6 07/12/2022    Imaging Studies   No results found.  Assessment   GERD/Dysphagia: chronic acid reflux symptoms and swallowing issues since  in his 32s. Needs EGD to rule out complicated GERD, esophageal strictures, EOE.   High risk screening colonoscopy: Father with history of colon polyps.   Elevated alkaline phosphatase: mildly elevated. Repeat fasting LFTs in four months.    PLAN   Pantoprazole 40 mg twice daily before meals.   Recheck LFTs in 4 months, fasting. Plan for EGD/ED/colonoscopy with Dr. Abbey Chatters in approximately 8 weeks.  Recommend holding off until he has been cleared by surgery if possible.  We will touch base with patient in 6 to 8 weeks to schedule or he can call sooner if he is cleared by surgery before this. ASA 2.  I have discussed the risks, alternatives, benefits with regards to but not limited to the risk of reaction to medication, bleeding, infection, perforation and the patient is agreeable to proceed. Written consent to be obtained.   Laureen Ochs. Bobby Rumpf, [place], [facility]

## 2022-08-01 ENCOUNTER — Encounter (HOSPITAL_COMMUNITY): Payer: Self-pay

## 2022-08-01 ENCOUNTER — Encounter (HOSPITAL_COMMUNITY)
Admission: RE | Admit: 2022-08-01 | Discharge: 2022-08-01 | Disposition: A | Discharge status: Home / Self Care | Payer: BC Managed Care – PPO | Source: Ambulatory Visit | Attending: Orthopedic Surgery | Admitting: Orthopedic Surgery

## 2022-08-01 DIAGNOSIS — Z01812 Encounter for preprocedural laboratory examination: Secondary | ICD-10-CM | POA: Insufficient documentation

## 2022-08-01 DIAGNOSIS — S46012D Strain of muscle(s) and tendon(s) of the rotator cuff of left shoulder, subsequent encounter: Secondary | ICD-10-CM | POA: Insufficient documentation

## 2022-08-01 DIAGNOSIS — X58XXXD Exposure to other specified factors, subsequent encounter: Secondary | ICD-10-CM | POA: Diagnosis not present

## 2022-08-01 HISTORY — DX: Other specified postprocedural states: R11.2

## 2022-08-01 HISTORY — DX: Other specified postprocedural states: Z98.890

## 2022-08-01 HISTORY — DX: Cerebral infarction, unspecified: I63.9

## 2022-08-01 HISTORY — DX: Hyperlipidemia, unspecified: E78.5

## 2022-08-01 LAB — BASIC METABOLIC PANEL
Anion gap: 8 (ref 5–15)
BUN: 8 mg/dL (ref 6–20)
CO2: 22 mmol/L (ref 22–32)
Calcium: 8.9 mg/dL (ref 8.9–10.3)
Chloride: 111 mmol/L (ref 98–111)
Creatinine, Ser: 0.95 mg/dL (ref 0.61–1.24)
GFR, Estimated: >60 mL/min (ref >60)
Glucose, Bld: 101 mg/dL — ABNORMAL HIGH (ref 70–99)
Potassium: 3.7 mmol/L (ref 3.5–5.1)
Sodium: 141 mmol/L (ref 135–145)

## 2022-08-01 LAB — CBC WITH DIFFERENTIAL/PLATELET
Abs Immature Granulocytes: 0.03 10*3/uL (ref 0.00–0.07)
Basophils Absolute: 0 10*3/uL (ref 0.0–0.1)
Basophils Relative: 1 %
Eosinophils Absolute: 0.1 10*3/uL (ref 0.0–0.5)
Eosinophils Relative: 2 %
HCT: 43.2 % (ref 39.0–52.0)
Hemoglobin: 14.7 g/dL (ref 13.0–17.0)
Immature Granulocytes: 0 %
Lymphocytes Relative: 25 %
Lymphs Abs: 1.9 10*3/uL (ref 0.7–4.0)
MCH: 31.4 pg (ref 26.0–34.0)
MCHC: 34 g/dL (ref 30.0–36.0)
MCV: 92.3 fL (ref 80.0–100.0)
Monocytes Absolute: 0.5 10*3/uL (ref 0.1–1.0)
Monocytes Relative: 6 %
Neutro Abs: 5 10*3/uL (ref 1.7–7.7)
Neutrophils Relative %: 66 %
Platelets: 235 10*3/uL (ref 150–400)
RBC: 4.68 MIL/uL (ref 4.22–5.81)
RDW: 12.5 % (ref 11.5–15.5)
WBC: 7.6 10*3/uL (ref 4.0–10.5)
nRBC: 0 % (ref 0.0–0.2)

## 2022-08-05 ENCOUNTER — Encounter: Payer: Self-pay | Admitting: Orthopedic Surgery

## 2022-08-05 ENCOUNTER — Ambulatory Visit (HOSPITAL_COMMUNITY): Payer: Worker's Compensation | Admitting: Certified Registered Nurse Anesthetist

## 2022-08-05 ENCOUNTER — Ambulatory Visit (HOSPITAL_BASED_OUTPATIENT_CLINIC_OR_DEPARTMENT_OTHER): Payer: Worker's Compensation | Admitting: Certified Registered Nurse Anesthetist

## 2022-08-05 ENCOUNTER — Ambulatory Visit (HOSPITAL_COMMUNITY)
Admission: RE | Admit: 2022-08-05 | Discharge: 2022-08-05 | Disposition: A | Discharge status: Home / Self Care | Payer: Worker's Compensation | Attending: Orthopedic Surgery | Admitting: Orthopedic Surgery

## 2022-08-05 ENCOUNTER — Encounter (HOSPITAL_COMMUNITY)
Admission: RE | Disposition: A | Discharge status: Home / Self Care | Payer: Self-pay | Source: Home / Self Care | Attending: Orthopedic Surgery

## 2022-08-05 ENCOUNTER — Other Ambulatory Visit: Payer: Self-pay

## 2022-08-05 DIAGNOSIS — Y990 Civilian activity done for income or pay: Secondary | ICD-10-CM | POA: Diagnosis not present

## 2022-08-05 DIAGNOSIS — M75112 Incomplete rotator cuff tear or rupture of left shoulder, not specified as traumatic: Secondary | ICD-10-CM | POA: Diagnosis not present

## 2022-08-05 DIAGNOSIS — S46012D Strain of muscle(s) and tendon(s) of the rotator cuff of left shoulder, subsequent encounter: Secondary | ICD-10-CM | POA: Diagnosis not present

## 2022-08-05 DIAGNOSIS — M75102 Unspecified rotator cuff tear or rupture of left shoulder, not specified as traumatic: Secondary | ICD-10-CM

## 2022-08-05 DIAGNOSIS — S46012A Strain of muscle(s) and tendon(s) of the rotator cuff of left shoulder, initial encounter: Secondary | ICD-10-CM | POA: Diagnosis present

## 2022-08-05 DIAGNOSIS — K219 Gastro-esophageal reflux disease without esophagitis: Secondary | ICD-10-CM | POA: Insufficient documentation

## 2022-08-05 DIAGNOSIS — S46012S Strain of muscle(s) and tendon(s) of the rotator cuff of left shoulder, sequela: Secondary | ICD-10-CM

## 2022-08-05 DIAGNOSIS — S43082A Other subluxation of left shoulder joint, initial encounter: Secondary | ICD-10-CM | POA: Diagnosis not present

## 2022-08-05 DIAGNOSIS — X58XXXA Exposure to other specified factors, initial encounter: Secondary | ICD-10-CM | POA: Diagnosis not present

## 2022-08-05 DIAGNOSIS — F17210 Nicotine dependence, cigarettes, uncomplicated: Secondary | ICD-10-CM | POA: Insufficient documentation

## 2022-08-05 HISTORY — PX: SHOULDER ARTHROSCOPY WITH ROTATOR CUFF REPAIR: SHX5685

## 2022-08-05 SURGERY — ARTHROSCOPY, SHOULDER, WITH ROTATOR CUFF REPAIR
Anesthesia: General | Site: Shoulder | Laterality: Left

## 2022-08-05 MED ORDER — VANCOMYCIN HCL IN DEXTROSE 1-5 GM/200ML-% IV SOLN
1000.0000 mg | INTRAVENOUS | Status: DC
  Filled 2022-08-05: qty 200

## 2022-08-05 MED ORDER — ONDANSETRON HCL 4 MG PO TABS: 4.0000 mg | ORAL_TABLET | Freq: Three times a day (TID) | ORAL | 0 refills | Status: AC | PRN

## 2022-08-05 MED ORDER — EPINEPHRINE PF 1 MG/ML IJ SOLN
INTRAMUSCULAR | Status: AC
  Filled 2022-08-05: qty 8

## 2022-08-05 MED ORDER — MEPERIDINE HCL 50 MG/ML IJ SOLN: 6.2500 mg | INTRAMUSCULAR | Status: DC | PRN

## 2022-08-05 MED ORDER — LACTATED RINGERS IV SOLN: INTRAVENOUS | Status: DC

## 2022-08-05 MED ORDER — PHENYLEPHRINE HCL-NACL 20-0.9 MG/250ML-% IV SOLN
INTRAVENOUS | Status: AC
  Filled 2022-08-05: qty 250

## 2022-08-05 MED ORDER — FENTANYL CITRATE (PF) 100 MCG/2ML IJ SOLN
100.0000 ug | Freq: Once | INTRAMUSCULAR | Status: AC
  Administered 2022-08-05 (×2): 50 ug via INTRAVENOUS

## 2022-08-05 MED ORDER — ONDANSETRON HCL 4 MG/2ML IJ SOLN: 4.0000 mg | Freq: Once | INTRAMUSCULAR | Status: DC | PRN

## 2022-08-05 MED ORDER — PROPOFOL 10 MG/ML IV BOLUS
INTRAVENOUS | Status: AC
  Filled 2022-08-05: qty 20

## 2022-08-05 MED ORDER — OXYCODONE HCL 5 MG PO TABS: 5.0000 mg | ORAL_TABLET | ORAL | 0 refills | Status: AC | PRN

## 2022-08-05 MED ORDER — VANCOMYCIN HCL 1000 MG IV SOLR
INTRAVENOUS | Status: DC | PRN
  Administered 2022-08-05: 1000 mg via INTRAVENOUS

## 2022-08-05 MED ORDER — HYDROMORPHONE HCL 1 MG/ML IJ SOLN: 0.2500 mg | INTRAMUSCULAR | Status: DC | PRN

## 2022-08-05 MED ORDER — ORAL CARE MOUTH RINSE: 15.0000 mL | Freq: Once | OROMUCOSAL | Status: DC

## 2022-08-05 MED ORDER — ONDANSETRON HCL 4 MG/2ML IJ SOLN
INTRAMUSCULAR | Status: DC | PRN
  Administered 2022-08-05: 4 mg via INTRAVENOUS

## 2022-08-05 MED ORDER — ACETAMINOPHEN 500 MG PO TABS: 1000.0000 mg | ORAL_TABLET | Freq: Three times a day (TID) | ORAL | 0 refills | Status: AC

## 2022-08-05 MED ORDER — CELECOXIB 100 MG PO CAPS: 100.0000 mg | ORAL_CAPSULE | Freq: Every day | ORAL | 0 refills | Status: AC

## 2022-08-05 MED ORDER — MIDAZOLAM HCL 2 MG/2ML IJ SOLN
2.0000 mg | Freq: Once | INTRAMUSCULAR | Status: AC
  Administered 2022-08-05 (×2): 1 mg via INTRAVENOUS

## 2022-08-05 MED ORDER — DEXAMETHASONE SODIUM PHOSPHATE 4 MG/ML IJ SOLN
INTRAMUSCULAR | Status: AC
  Filled 2022-08-05: qty 1

## 2022-08-05 MED ORDER — MIDAZOLAM HCL 2 MG/2ML IJ SOLN
INTRAMUSCULAR | Status: AC
  Filled 2022-08-05: qty 2

## 2022-08-05 MED ORDER — FENTANYL CITRATE (PF) 100 MCG/2ML IJ SOLN
INTRAMUSCULAR | Status: AC
  Filled 2022-08-05: qty 2

## 2022-08-05 MED ORDER — CHLORHEXIDINE GLUCONATE 0.12 % MT SOLN: 15.0000 mL | Freq: Once | OROMUCOSAL | Status: DC

## 2022-08-05 MED ORDER — VANCOMYCIN HCL 1000 MG IV SOLR
INTRAVENOUS | Status: AC
  Filled 2022-08-05: qty 20

## 2022-08-05 MED ORDER — BUPIVACAINE HCL (PF) 0.5 % IJ SOLN
INTRAMUSCULAR | Status: AC
  Filled 2022-08-05: qty 30

## 2022-08-05 MED ORDER — LIDOCAINE HCL (PF) 1 % IJ SOLN
INTRAMUSCULAR | Status: DC | PRN
  Administered 2022-08-05: 100 mg

## 2022-08-05 MED ORDER — DEXAMETHASONE SODIUM PHOSPHATE 4 MG/ML IJ SOLN
INTRAMUSCULAR | Status: AC
  Filled 2022-08-05: qty 2

## 2022-08-05 MED ORDER — SCOPOLAMINE 1 MG/3DAYS TD PT72
1.0000 | MEDICATED_PATCH | Freq: Once | TRANSDERMAL | Status: DC
  Administered 2022-08-05: 1.5 mg via TRANSDERMAL
  Filled 2022-08-05: qty 1

## 2022-08-05 MED ORDER — PHENYLEPHRINE HCL (PRESSORS) 10 MG/ML IV SOLN
INTRAVENOUS | Status: DC | PRN
  Administered 2022-08-05 (×2): 80 ug via INTRAVENOUS
  Administered 2022-08-05: 160 ug via INTRAVENOUS
  Administered 2022-08-05 (×3): 80 ug via INTRAVENOUS

## 2022-08-05 MED ORDER — LIDOCAINE HCL (PF) 1 % IJ SOLN
INTRAMUSCULAR | Status: AC
  Filled 2022-08-05: qty 30

## 2022-08-05 MED ORDER — SODIUM CHLORIDE 0.9 % IR SOLN
Status: DC | PRN
  Administered 2022-08-05 (×6): 3000 mL

## 2022-08-05 MED ORDER — LIDOCAINE HCL 1 % IJ SOLN: INTRAMUSCULAR | Status: DC | PRN

## 2022-08-05 MED ORDER — BUPIVACAINE HCL (PF) 0.5 % IJ SOLN
INTRAMUSCULAR | Status: DC | PRN
  Administered 2022-08-05: 24 mL via PERINEURAL

## 2022-08-05 MED ORDER — SODIUM CHLORIDE 0.9 % IR SOLN
Status: DC | PRN
  Administered 2022-08-05: 1000 mL

## 2022-08-05 MED ORDER — LIDOCAINE HCL (PF) 1 % IJ SOLN
INTRAMUSCULAR | Status: DC | PRN
  Administered 2022-08-05: 3 mL

## 2022-08-05 MED ORDER — ROCURONIUM BROMIDE 100 MG/10ML IV SOLN
INTRAVENOUS | Status: DC | PRN
  Administered 2022-08-05: 60 mg via INTRAVENOUS
  Administered 2022-08-05: 5 mg via INTRAVENOUS
  Administered 2022-08-05: 10 mg via INTRAVENOUS

## 2022-08-05 MED ORDER — SUGAMMADEX SODIUM 200 MG/2ML IV SOLN
INTRAVENOUS | Status: DC | PRN
  Administered 2022-08-05: 200 mg via INTRAVENOUS

## 2022-08-05 MED ORDER — BUPIVACAINE-MELOXICAM ER 200-6 MG/7ML IJ SOLN
INTRAMUSCULAR | Status: AC
  Filled 2022-08-05: qty 1

## 2022-08-05 MED ORDER — PROPOFOL 10 MG/ML IV BOLUS
INTRAVENOUS | Status: DC | PRN
  Administered 2022-08-05: 160 mg via INTRAVENOUS

## 2022-08-05 MED ORDER — DEXAMETHASONE SODIUM PHOSPHATE 4 MG/ML IJ SOLN
INTRAMUSCULAR | Status: DC | PRN
  Administered 2022-08-05: 6 mg via PERINEURAL

## 2022-08-05 SURGICAL SUPPLY — 63 items
ANCH SUT SWLK 19.1X4.75 (Anchor) ×2 IMPLANT
ANCHOR SUT BIO SW 4.75X19.1 (Anchor) IMPLANT
APL PRP STRL LF DISP 70% ISPRP (MISCELLANEOUS) ×1
BLADE SURG SZ11 CARB STEEL (BLADE) ×1 IMPLANT
BNDG GAUZE ELAST 4 BULKY (GAUZE/BANDAGES/DRESSINGS) ×2 IMPLANT
CANNULA 8.25X9 (CANNULA) IMPLANT
CANNULA TWIST IN 8.25X7CM (CANNULA) IMPLANT
CHLORAPREP W/TINT 26 (MISCELLANEOUS) ×1 IMPLANT
CLOTH BEACON ORANGE TIMEOUT ST (SAFETY) ×1 IMPLANT
COOLER ICEMAN CLASSIC (MISCELLANEOUS) ×1 IMPLANT
COVER LIGHT HANDLE STERIS (MISCELLANEOUS) ×2 IMPLANT
CUTTER BONE 4.0MM X 13CM (MISCELLANEOUS) ×1 IMPLANT
DRAPE SHOULDER BEACH CHAIR (DRAPES) ×1 IMPLANT
ELECT REM PT RETURN 9FT ADLT (ELECTROSURGICAL) ×1
ELECTRODE REM PT RTRN 9FT ADLT (ELECTROSURGICAL) ×1 IMPLANT
GAUZE 4X4 16PLY ~~LOC~~+RFID DBL (SPONGE) ×1 IMPLANT
GAUZE SPONGE 4X4 12PLY STRL (GAUZE/BANDAGES/DRESSINGS) ×1 IMPLANT
GAUZE XEROFORM 1X8 LF (GAUZE/BANDAGES/DRESSINGS) ×1 IMPLANT
GLOVE BIO SURGEON STRL SZ8 (GLOVE) ×3 IMPLANT
GLOVE BIOGEL PI IND STRL 7.0 (GLOVE) ×2 IMPLANT
GLOVE SRG 8 PF TXTR STRL LF DI (GLOVE) ×1 IMPLANT
GLOVE SURG UNDER POLY LF SZ8 (GLOVE) ×1
GOWN STRL REUS W/ TWL XL LVL3 (GOWN DISPOSABLE) ×1 IMPLANT
GOWN STRL REUS W/TWL LRG LVL3 (GOWN DISPOSABLE) ×2 IMPLANT
GOWN STRL REUS W/TWL XL LVL3 (GOWN DISPOSABLE) ×1
INST SET MINOR BONE (KITS) ×1 IMPLANT
IV NS IRRIG 3000ML ARTHROMATIC (IV SOLUTION) ×2 IMPLANT
KIT BLADEGUARD II DBL (SET/KITS/TRAYS/PACK) ×1 IMPLANT
KIT POSITION SHOULDER SCHLEI (MISCELLANEOUS) ×1 IMPLANT
KIT TURNOVER KIT A (KITS) ×1 IMPLANT
MANIFOLD NEPTUNE II (INSTRUMENTS) ×1 IMPLANT
MARKER SKIN DUAL TIP RULER LAB (MISCELLANEOUS) ×1 IMPLANT
NDL HYPO 18GX1.5 BLUNT FILL (NEEDLE) IMPLANT
NDL HYPO 21X1.5 SAFETY (NEEDLE) IMPLANT
NDL SCORPION MULTI FIRE (NEEDLE) IMPLANT
NDL SPNL 18GX3.5 QUINCKE PK (NEEDLE) ×1 IMPLANT
NEEDLE HYPO 18GX1.5 BLUNT FILL (NEEDLE) ×1 IMPLANT
NEEDLE HYPO 21X1.5 SAFETY (NEEDLE) ×1 IMPLANT
NEEDLE SCORPION MULTI FIRE (NEEDLE) ×1 IMPLANT
NEEDLE SPNL 18GX3.5 QUINCKE PK (NEEDLE) ×1 IMPLANT
NS IRRIG 1000ML POUR BTL (IV SOLUTION) ×1 IMPLANT
PACK TOTAL JOINT (CUSTOM PROCEDURE TRAY) ×1 IMPLANT
PAD ABD 5X9 TENDERSORB (GAUZE/BANDAGES/DRESSINGS) IMPLANT
PAD ARMBOARD 7.5X6 YLW CONV (MISCELLANEOUS) ×1 IMPLANT
PAD COLD SHLDR WRAP-ON (PAD) IMPLANT
PORT APPOLLO RF 90DEGREE MULTI (SURGICAL WAND) ×1 IMPLANT
SET ARTHROSCOPY INST (INSTRUMENTS) ×1 IMPLANT
SET BASIN LINEN APH (SET/KITS/TRAYS/PACK) ×1 IMPLANT
SET SHOULDER TRAC (MISCELLANEOUS) ×1 IMPLANT
SET SHOULDER TRACTION (MISCELLANEOUS) ×1
SLING ARM ULTRA III XL (SLING) IMPLANT
STRIP CLOSURE SKIN 1/2X4 (GAUZE/BANDAGES/DRESSINGS) IMPLANT
SUT ETHILON 3 0 FSL (SUTURE) IMPLANT
SUTURE TAPE TIGERLINK 1.3MM BL (SUTURE) IMPLANT
SUTURETAPE TIGERLINK 1.3MM BL (SUTURE) ×1
SYR 30ML LL (SYRINGE) IMPLANT
SYR 5ML LL (SYRINGE) IMPLANT
SYR BULB IRRIG 60ML STRL (SYRINGE) ×1 IMPLANT
TAPE SURG TRANSPORE 1 IN (GAUZE/BANDAGES/DRESSINGS) IMPLANT
TAPE SURGICAL TRANSPORE 1 IN (GAUZE/BANDAGES/DRESSINGS) ×1
TOWEL OR 17X26 4PK STRL BLUE (TOWEL DISPOSABLE) IMPLANT
TUBING IN/OUT FLOW W/MAIN PUMP (TUBING) ×1 IMPLANT
YANKAUER SUCT 12FT TUBE ARGYLE (SUCTIONS) ×1 IMPLANT

## 2022-08-05 NOTE — Anesthesia Postprocedure Evaluation (Signed)
Anesthesia Post Note  Patient: Rylend Pietrzak Krapf Sr.  Procedure(s) Performed: SHOULDER ARTHROSCOPY WITH DEBRIDEMENT AND BICEPS TENODESIS (Left: Shoulder)  Patient location during evaluation: Phase II Anesthesia Type: General Level of consciousness: awake and alert and oriented Pain management: pain level controlled Vital Signs Assessment: post-procedure vital signs reviewed and stable Respiratory status: spontaneous breathing, nonlabored ventilation and respiratory function stable Cardiovascular status: blood pressure returned to baseline and stable Postop Assessment: no apparent nausea or vomiting Anesthetic complications: no   No notable events documented.   Last Vitals:  Vitals:   08/05/22 1045 08/05/22 1117  BP: 136/84 (!) 151/95  Pulse: 85 90  Resp: 15 16  Temp:  36.4 C  SpO2: 95% 99%    Last Pain:  Vitals:   08/05/22 1117  TempSrc: Oral  PainSc: 0-No pain                 Tala Eber C Odell Choung

## 2022-08-05 NOTE — H&P (Signed)
Below is the most recent clinic note for Andrew Mode Sr.; any pertinent information regarding their recent medical history will be updated on the day of surgery.    Orthopaedic Clinic Return   Assessment: Andrew Audi Sr. is a 46 y.o. male with the following: Acute left shoulder pain, with small, full-thickness rotator cuff tear.   Plan: Andrew Gillespie continues to have some pain and limitation with his left shoulder.  He has had a chance to consider surgery.  The procedure and recovery were discussed at great lengths.  Given the lack of improvement, and the MRI documenting a small rotator cuff tear, he would like to proceed with surgery.  All questions were answered.  We will have to obtain authorization through Andrew Gillespie.  In addition, he has not seen a primary care doctor in several years.  He does smoke upwards of 1 pack of cigarettes per day, and so I think it is prudent for him to obtain medical clearance prior to surgery.  We will coordinate this, and plan to get him scheduled for surgery as soon as we have clearance and authorization with Andrew Gillespie.   Follow-up: Return for After medical clearance for OR.     Subjective:       Chief Complaint  Patient presents with   Shoulder Pain      LT shoulder/ follow up DOI 03/22/22 Pain level depends on the day      History of Present Illness: Andrew Hart Sr. is a 46 y.o. male who returns to clinic for repeat evaluation of left shoulder pain.  He has had pain since May, in his left shoulder.  He has remained constant.  He tolerates gentle range of motion, but has limited function.  After discussing potential treatment options at the last visit, he is interested in considering surgery at this time.     Review of Systems: No fevers or chills No numbness or tingling No chest pain No shortness of breath No bowel or bladder dysfunction No GI distress No headaches     Objective: There were no vitals  taken for this visit.   Physical Exam:   Alert and oriented.  No acute distress.   Left shoulder without deformity.  Forward elevation to 150 degrees.  Internal rotation to his lumbar spine.  Tenderness to palpation of the anterior shoulder.  Negative belly press.  4/5 strength in the supraspinatus, infraspinatus and deltoid.  Fingers warm and well-perfused.  2+ radial pulse.  IMAGING: I personally ordered and reviewed the following images:   MRI completed at an outside facility.  There is a small, 8 mm tear in the anterior aspect of the supraspinatus, without retraction.  No atrophy is appreciated.Orthopaedic Clinic Return   Assessment: Andrew Pilley Sr. is a 46 y.o. male with the following: Acute left shoulder pain, with small, full-thickness rotator cuff tear.   Plan: Andrew Gillespie continues to have some pain and limitation with his left shoulder.  He has had a chance to consider surgery.  The procedure and recovery were discussed at great lengths.  Given the lack of improvement, and the MRI documenting a small rotator cuff tear, he would like to proceed with surgery.  All questions were answered.  We will have to obtain authorization through Andrew Gillespie.  In addition, he has not seen a primary care doctor in several years.  He does smoke upwards of 1 pack of cigarettes per day, and so I think it is prudent  for him to obtain medical clearance prior to surgery.  We will coordinate this, and plan to get him scheduled for surgery as soon as we have clearance and authorization with Andrew Gillespie.   Follow-up: Return for After medical clearance for OR.     Subjective:       Chief Complaint  Patient presents with   Shoulder Pain      LT shoulder/ follow up DOI 03/22/22 Pain level depends on the day      History of Present Illness: Andrew Eiland Sr. is a 46 y.o. male who returns to clinic for repeat evaluation of left shoulder pain.  He has had pain since May, in  his left shoulder.  He has remained constant.  He tolerates gentle range of motion, but has limited function.  After discussing potential treatment options at the last visit, he is interested in considering surgery at this time.     Review of Systems: No fevers or chills No numbness or tingling No chest pain No shortness of breath No bowel or bladder dysfunction No GI distress No headaches     Objective: There were no vitals taken for this visit.   Physical Exam:   Alert and oriented.  No acute distress.   Left shoulder without deformity.  Forward elevation to 150 degrees.  Internal rotation to his lumbar spine.  Tenderness to palpation of the anterior shoulder.  Negative belly press.  4/5 strength in the supraspinatus, infraspinatus and deltoid.  Fingers warm and well-perfused.  2+ radial pulse.  IMAGING: I personally ordered and reviewed the following images:   MRI completed at an outside facility.  There is a small, 8 mm tear in the anterior aspect of the supraspinatus, without retraction.  No atrophy is appreciated.

## 2022-08-05 NOTE — Interval H&P Note (Signed)
History and Physical Interval Note:  08/05/2022 7:19 AM  Andrew Everts Dubin Sr.  has presented today for surgery, with the diagnosis of Traumatic complete tear of left rotator cuff.  The various methods of treatment have been discussed with the patient and family. After consideration of risks, benefits and other options for treatment, the patient has consented to  Procedure(s): SHOULDER ARTHROSCOPY WITH ROTATOR CUFF REPAIR (Left) as a surgical intervention.  The patient's history has been reviewed, patient examined, no change in status, stable for surgery.  I have reviewed the patient's chart and labs.  Questions were answered to the patient's satisfaction.     Mordecai Rasmussen

## 2022-08-05 NOTE — Progress Notes (Signed)
Dr Charna Elizabeth at bedside. Interscalene block done. Tolerated well.  Time [id number] Procedure [id number] Procedure [id number]

## 2022-08-05 NOTE — Anesthesia Procedure Notes (Signed)
Anesthesia Regional Block: Interscalene brachial plexus block   Pre-Anesthetic Checklist: , timeout performed,  Correct Patient, Correct Site, Correct Laterality,  Correct Procedure, Correct Position, site marked,  Risks and benefits discussed,  Surgical consent,  Pre-op evaluation,  At surgeon's request and post-op pain management  Laterality: Upper and Left  Prep: chloraprep       Needles:  Injection technique: Single-shot  Needle Type: Echogenic Stimulator Needle     Needle Length: 8.3cm  Needle Gauge: 22     Additional Needles:   Procedures:, nerve stimulator,,, ultrasound used (permanent image in chart),,     Nerve Stimulator or Paresthesia:  Response: Twitch elicited, 0.6 mA, 0.3 ms, 5 cm  Additional Responses:   Narrative:  Start time: 08/05/2022 7:24 AM End time: 08/05/2022 7:35 AM Injection made incrementally with aspirations every 5 mL.  Performed by: Personally  Anesthesiologist: Denese Killings, MD  Additional Notes: Block assessed prior to start of surgery

## 2022-08-05 NOTE — Transfer of Care (Signed)
Immediate Anesthesia Transfer of Care Note  Patient: Constant Mandeville Decuir Sr.  Procedure(s) Performed: SHOULDER ARTHROSCOPY WITH DEBRIDEMENT AND BICEPS TENODESIS (Left: Shoulder)  Patient Location: PACU  Anesthesia Type:General  Level of Consciousness: drowsy  Airway & Oxygen Therapy: Patient connected to nasal cannula oxygen  Post-op Assessment: Report given to RN, Post -op Vital signs reviewed and stable and Patient moving all extremities  Post vital signs: Reviewed and stable  Last Vitals:  Vitals Value Taken Time  BP 127/75 08/05/22 1015  Temp    Pulse 77 08/05/22 1023  Resp 21 08/05/22 1023  SpO2 95 % 08/05/22 1023  Vitals shown include unvalidated device data.  Last Pain:  Vitals:   08/05/22 0645  PainSc: 0-No pain         Complications: No notable events documented.

## 2022-08-05 NOTE — Anesthesia Preprocedure Evaluation (Signed)
Anesthesia Evaluation  Patient identified by MRN, date of birth, ID band Patient awake    Reviewed: Allergy & Precautions, NPO status , Patient's Chart, lab work & pertinent test results  History of Anesthesia Complications (+) PONV and history of anesthetic complications  Airway Mallampati: II  TM Distance: >3 FB Neck ROM: Full    Dental  (+) Dental Advisory Given, Chipped Left Upper and lower broken teethx2:   Pulmonary Current Smoker and Patient abstained from smoking.,    Pulmonary exam normal breath sounds clear to auscultation       Cardiovascular negative cardio ROS Normal cardiovascular exam Rhythm:Regular Rate:Normal     Neuro/Psych PSYCHIATRIC DISORDERS Depression Bipolar Disorder TIACVA, No Residual Symptoms    GI/Hepatic Neg liver ROS, GERD  Medicated,  Endo/Other  negative endocrine ROS  Renal/GU negative Renal ROS  negative genitourinary   Musculoskeletal Right shoulder rotator cuff tear    Abdominal   Peds negative pediatric ROS (+)  Hematology negative hematology ROS (+)   Anesthesia Other Findings   Reproductive/Obstetrics negative OB ROS                           Anesthesia Physical Anesthesia Plan  ASA: 2  Anesthesia Plan: General   Post-op Pain Management: Regional block* and Dilaudid IV   Induction: Intravenous  PONV Risk Score and Plan: 3 and Ondansetron, Dexamethasone and Scopolamine patch - Pre-op  Airway Management Planned: Oral ETT  Additional Equipment:   Intra-op Plan:   Post-operative Plan: Extubation in OR  Informed Consent: I have reviewed the patients History and Physical, chart, labs and discussed the procedure including the risks, benefits and alternatives for the proposed anesthesia with the patient or authorized representative who has indicated his/her understanding and acceptance.     Dental advisory given  Plan Discussed with: CRNA  and Surgeon  Anesthesia Plan Comments:        Anesthesia Quick Evaluation

## 2022-08-05 NOTE — Op Note (Addendum)
Orthopaedic Surgery Operative Note (CSN: [id number])  [name]  09-12-1976 Date of Surgery: 08/05/2022   Diagnoses:  Partial articular sided rotator cuff tear Subluxation of the biceps.   Procedure: Arthroscopic subacromial decompression Arthroscopic biceps tenodesis   Operative Finding Exam under anesthesia: Full forward flexion, full abduction, 50 degrees of external rotation, 90 degrees of external rotation and internal rotation in 90/90 position Articular space: No loose bodies, capsule intact, labrum intact Chondral surfaces:Intact, no sign of chondral degeneration on the glenoid or humeral head Biceps: irritation of the biceps, subluxation of the biceps out of the groove with under surface rotator cuff tendon irritation Subscapularis: Intact Superior Cuff: Intact Bursal side: Intact  Successful completion of the planned procedure.  Debridement of rotator interval.  Arthroscopic biceps tenodesis.  Light debridement of fraying of articular sided rotator cuff   Post-Op Diagnosis: Same Surgeons:Primary: Mordecai Rasmussen, MD Assistants: None Location: [facility] OR ROOM 4 Anesthesia: General with Exparel interscalene block Antibiotics: Vancomycin Tourniquet time: None Estimated Blood Loss: Minimal Complications: None Specimens: None Implants: Implant Name Type Inv. Item Serial No. Manufacturer Lot No. LRB No. Used Action  ANCHOR SUT BIO SW 4.75X19.1 - [id number] Anchor ANCHOR SUT BIO SW [id number]  Rolinda Roan [id number] Left 2 Implanted    Indications for Surgery:   Andrew Budzynski Sr. is a 46 y.o. male with continued shoulder pain refractory to nonoperative measures for extended period of time.  He sustained an injury to his left shoulder while at work.  He worked with physical therapy, with limited improvement in his symptoms.  He has had injections in the shoulder, with limited sustained relief.  He complains of pain in the anterior and lateral aspect of the shoulder.  His  function is pretty good, but he continues to have pain.  MRI demonstrates a small tear in the anterior aspect of the rotator cuff.  The risks and benefits were explained at length including but not limited to continued pain, biceps tenodesis failure, stiffness, need for further surgery and infection as well as more severe complications associated with anesthesia.  He elects to proceed.  Surgical consent was finalized.   Procedure:   Patient was correctly identified in the preoperative holding area and operative site marked.  Patient brought to OR and positioned beachchair on an [place] table ensuring that all bony prominences were padded and the head was in an appropriate location.  Anesthesia was induced and the operative shoulder was prepped and draped in the usual sterile fashion.  Timeout was called preincision.  A standard posterior viewing portal was made after localizing the portal with a spinal needle.  An anterior accessory portal was also made.  The articular surfaces were intact.  Subscapularis tendon was intact.  There is no SLAP tear.  There was some irritation of biceps tendon, and there was clear irritation of the undersurface of the biceps, with partial-thickness tearing.  Due to the appearance of the biceps, and its relationship to the fraying of the rotator cuff, we decided to truncate the biceps tendon and plan for biceps tenodesis.  We placed a cannula in the anterior portal.  The tendon was secured using a scorpion needle passer.  A stitch was placed through the tendon and we had full control.  We then used an arthroscopic biter to truncate the tendon.  We then used a punch to create a pilot hole at the superior aspect of the bicipital groove.  We had full control of the tendon.  This  was secured to the superior aspect of the groove with a swivel lock anchor.  The camera was then inserted within the subacromial space.  The bursa was swept away with the cannula.  We then made an  anterolateral portal, and inserted a shaver.  There was not a lot of bursal tissue.  This was debrided with combination of shaver and electrocautery.  The bursal side of the rotator cuff tendons were closely evaluated.  There was some very mild fraying.  There was no full-thickness component of the rotator cuff tendon.  The shoulder was internally and externally rotated to get full evaluation of the footprint of the rotator cuff.  We were unable to see a complete thickness tear of the rotator cuff despite manipulation of the shoulder.  We then proceeded to complete our subacromial decompression.  There were no bone spurs that needed to be addressed.  We then inserted the camera back within the joint.  We closely evaluated the undersurface of the rotator cuff.  At the bicipital groove, there is a lot of fraying, and clear irritation of the undersurface of the rotator cuff.  There was some redundant tendon in this area.  This was lightly debrided.  We closely evaluated the rotator cuff adjacent to the biceps in the bicipital groove.  There were no full-thickness components.  At this point, we felt that the irritation and the fraying of the rotator cuff was likely related to the subluxation of the biceps out of the groove.  The subscapularis was intact, and there was nothing else that needed to be addressed with arthroscopy.  Final pictures were taken.  The instruments and camera were then removed.   The incisions were closed with 3-0 nylon .  A sterile dressing was placed along with a sling. The patient was awoken from general anesthesia and taken to the PACU in stable condition without complication.     Post-operative plan:  The patient will be non-weightbearing in a sling.   The patient will be discharged home from the PACU.   DVT prophylaxis not indicated in ambulatory upper extremity patient without known risk factors.  He will resume aspirin twice daily. Pain control with PRN pain medication  preferring oral medicines.  Follow up plan will be scheduled in approximately 7-10 days for incision check and XR.

## 2022-08-05 NOTE — Anesthesia Procedure Notes (Signed)
Procedure Name: Intubation Date/Time: 08/05/2022 7:40 AM  Performed by: Minerva Ends, CRNAPre-anesthesia Checklist: Patient identified, Emergency Drugs available, Suction available and Patient being monitored Patient Re-evaluated:Patient Re-evaluated prior to induction Oxygen Delivery Method: Circle system utilized Preoxygenation: Pre-oxygenation with 100% oxygen Induction Type: IV induction Ventilation: Mask ventilation with difficulty and Two handed mask ventilation required Laryngoscope Size: Miller and 2 Tube type: Oral Tube size: 7.0 mm Number of attempts: 1 Airway Equipment and Method: Stylet and Oral airway Placement Confirmation: ETT inserted through vocal cords under direct vision, positive ETCO2 and breath sounds checked- equal and bilateral Secured at: 23 cm Tube secured with: Tape Dental Injury: Teeth and Oropharynx as per pre-operative assessment

## 2022-08-05 NOTE — Discharge Instructions (Signed)
Andrew [facility] A. Andrew Kinsman, MD [facility] [address] [place],  [place]  [address] Phone: [phone] Fax: [phone]    POST-OPERATIVE INSTRUCTIONS - SHOULDER ARTHROSCOPY  WOUND CARE You may remove the Operative Dressing on Post-Op Day #3 (72hrs after surgery).   Alternatively if you would like you can leave dressing on until follow-up if within 7-8 days but keep it dry. Leave steri-strips in place until they fall off on their own, usually 2 weeks postop. There may be a small amount of fluid/bleeding leaking at the surgical site. This is normal; the shoulder is filled with fluid during the procedure and can leak for 24-48hrs after surgery. ou may change/reinforce the bandage as needed.  Use the Cryocuff or Ice as often as possible for the first 7 days, then as needed for pain relief. Always keep a towel, ACE wrap or other barrier between the cooling unit and your skin.  You may shower on Post-Op Day #3. Gently pat the area dry. Do not soak the shoulder in water or submerge it. Keep dry incisions as dry as possible. Do not go swimming in the pool or ocean until 4 weeks after surgery or when otherwise instructed.    EXERCISES/BRACING Sling should be used at all times until follow-up.  You can remove sling for hygiene.    Please continue to ambulate and do not stay sitting or lying for too long. Perform foot and wrist pumps to assist in circulation.  POST-OP MEDICATIONS- Multimodal approach to pain control In general your pain will be controlled with a combination of substances.  Prescriptions unless otherwise discussed are electronically sent to your pharmacy.  This is a carefully made plan we use to minimize narcotic use.    Celebrex - Anti-inflammatory medication taken on a scheduled basis Acetaminophen - Non-narcotic pain medicine taken on a scheduled basis  Oxycodone - This is a strong narcotic, to be used only on an "as needed" basis for pain. Aspirin '81mg'$   - This medicine is used to minimize the risk of blood clots after surgery. Zofran - take as needed for nausea   FOLLOW-UP If you develop a Fever (?101.5), Redness or Drainage from the surgical incision site, please call our office to arrange for an evaluation. Please call the office to schedule a follow-up appointment for your suture removal, 10-14 days post-operatively.    HELPFUL INFORMATION  If you had a block, it will wear off between 8-24 hrs postop typically.  This is period when your pain may go from nearly zero to the pain you would have had postop without the block.  This is an abrupt transition but nothing dangerous is happening.  You may take an extra dose of narcotic when this happens.  You may be more comfortable sleeping in a semi-seated position the first few nights following surgery.  Keep a pillow propped under the elbow and forearm for comfort.  If you have a recliner type of chair it might be beneficial.  If not that is fine too, but it would be helpful to sleep propped up with pillows behind your operated shoulder as well under your elbow and forearm.  This will reduce pulling on the suture lines.  When dressing, put your operative arm in the sleeve first.  When getting undressed, take your operative arm out last.  Loose fitting, button-down shirts are recommended.  Often in the first days after surgery you may be more comfortable keeping your operative arm under your shirt  and not through the sleeve.  You may return to work/school in the next couple of days when you feel up to it.  Desk work and typing in the sling is     fine.  We suggest you use the pain medication the first night prior to going to bed, in order to ease any pain when the anesthesia wears off. You should avoid taking pain medications on an empty stomach as it will make you nauseous.  You should wean off your narcotic medicines as soon as you are able.  Most patients will be off or using minimal narcotics  before their first postop appointment.   Do not drink alcoholic beverages or take illicit drugs when taking pain medications.  It is against the law to drive while taking narcotics.  In some states it is against the law to drive while your arm is in a sling.   Pain medication may make you constipated.  Below are a few solutions to try in this order: Decrease the amount of pain medication if you aren't having pain. Drink lots of decaffeinated fluids. Drink prune juice and/or eat dried prunes  If the first 3 don't work start with additional solutions Take Colace - an over-the-counter stool softener Take Senokot - an over-the-counter laxative Take Miralax - a stronger over-the-counter laxative

## 2022-08-09 ENCOUNTER — Encounter (HOSPITAL_COMMUNITY): Payer: Self-pay | Admitting: Orthopedic Surgery

## 2022-08-12 ENCOUNTER — Telehealth: Payer: Self-pay | Admitting: Orthopedic Surgery

## 2022-08-12 NOTE — Telephone Encounter (Signed)
Message received from worker's comp nurse case manager [place], ph [phone], requesting post op orders.

## 2022-08-16 ENCOUNTER — Ambulatory Visit (INDEPENDENT_AMBULATORY_CARE_PROVIDER_SITE_OTHER): Payer: Worker's Compensation

## 2022-08-16 ENCOUNTER — Ambulatory Visit (INDEPENDENT_AMBULATORY_CARE_PROVIDER_SITE_OTHER): Payer: Worker's Compensation | Admitting: Orthopedic Surgery

## 2022-08-16 ENCOUNTER — Encounter: Payer: Self-pay | Admitting: Orthopedic Surgery

## 2022-08-16 VITALS — Ht 68.0 in | Wt 198.0 lb

## 2022-08-16 DIAGNOSIS — S46012D Strain of muscle(s) and tendon(s) of the rotator cuff of left shoulder, subsequent encounter: Secondary | ICD-10-CM

## 2022-08-16 NOTE — Telephone Encounter (Signed)
Order and protocol emailed to [place].

## 2022-08-17 ENCOUNTER — Encounter: Payer: Self-pay | Admitting: Orthopedic Surgery

## 2022-08-17 NOTE — Progress Notes (Signed)
Orthopaedic Postop Note  Assessment: Andrew Scrima Sr. is a 46 y.o. male s/p left shoulder arthroscopy, subacromial decompression and biceps tenodesis  DOS: 08/05/2022  Plan: Sutures removed, steri strips placed Procedure reviewed Ok to remove pillow Continue shoulder sling for additional 2-3 weeks Initiate PT, protocol provided Medications as needed; no refill requested Follow up in 4 weeks.   Follow-up: Return in about 4 weeks (around 09/13/2022). XR at next visit: none  Subjective:  Chief Complaint  Patient presents with   Routine Post Op    Lt shoulder DOS 08/05/22    History of Present Illness: Andrew Lucken Sr. is a 46 y.o. male who presents following the above stated procedure.  Surgery was 10 days ago.  He has done well.  He denies pain.  No numbness or tingling.  He has remained in a sling.  He has not been using the left arm.  No issues with incisions.   Review of Systems: No fevers or chills No numbness or tingling No Chest Pain No shortness of breath   Objective: Ht '5\' 8"'$  (1.727 m)   Wt 198 lb (89.8 kg)   BMI 30.11 kg/m   Physical Exam:  Alert and oriented.  No acute distress  Left shoulder incisions healing well.  No surrounding erythema or drainage. Sensation intact in the axillary nerve distribution.  Sensation intact throughout the left hand.  2+ radial pulse.   IMAGING: I personally ordered and reviewed the following images:  XR of the left shoulder obtained in clinic today.  Compared to prior XR.  No acute injuries.  Glenohumeral joint is reduced. Minimal degenerative changes.  Well maintained joint space.   Impression: normal left shoulder XR  Mordecai Rasmussen, MD 08/17/2022 9:13 PM

## 2022-08-22 ENCOUNTER — Telehealth: Payer: Self-pay | Admitting: Radiology

## 2022-08-22 NOTE — Telephone Encounter (Signed)
[facility] called, and LMVM saying that the PT order says start PT at 0-4 weeks postop.  They need an exact start date/release to PT date for this.  Please call them and reference # [id number] to advise them when PT can start.  Thanks.

## 2022-08-27 NOTE — Telephone Encounter (Signed)
Called and let [facility] know that provider wanted to therapy to start as soon as possible, states they will call pt and get him scheduled right away.

## 2022-09-03 ENCOUNTER — Encounter: Payer: Self-pay | Admitting: Radiology

## 2022-09-03 ENCOUNTER — Telehealth: Payer: Self-pay | Admitting: *Deleted

## 2022-09-03 NOTE — Telephone Encounter (Signed)
Patient is on recall to call 6-8 weeks to see if patient has been cleared Plan for EGD/ED/colonoscopy with Dr. Abbey Chatters in approximately 8 weeks.  Recommend holding off until he has been cleared by surgery if possible.  We will touch base with patient in 6 to 8 weeks to schedule or he can call sooner if he is cleared by surgery before this. ASA 2.

## 2022-09-03 NOTE — Progress Notes (Signed)
Andrew Gillespie's post-op PT start date of 09/03/22.just FYI I submitted the PT referral to [facility] on 08/22/22 when I received the orders.    Thanks!    Valentina Lucks, RN, CCM Medical Case Manager [facility]  [address] [place], [place] [address] Corporate office: [phone] Cell: [phone] Fax: [phone] Email: [email or web address]

## 2022-09-03 NOTE — Telephone Encounter (Signed)
    09/03/22  Aris Everts Angelo Sr. May 08, 1976  What type of surgery is being performed? Colonoscopy/egd  When is surgery scheduled? TBD  Clearance to proceed with procedure  Name of physician performing surgery?  Dr. Hurshel Keys [facility] Phone: [phone] Fax: [phone]  Anethesia type (none, local, MAC, general)? MAC

## 2022-09-04 NOTE — Telephone Encounter (Signed)
Per Magda Paganini can call pt to schedule once 8 weeks post op if he is feeling up to it. no restrictions as far as being able to lay on his side and out of any slings then we can proceed.   Will call patient once we get December schedule

## 2022-09-05 ENCOUNTER — Ambulatory Visit (INDEPENDENT_AMBULATORY_CARE_PROVIDER_SITE_OTHER): Payer: BC Managed Care – PPO | Admitting: Psychiatry

## 2022-09-05 ENCOUNTER — Encounter (HOSPITAL_COMMUNITY): Payer: Self-pay | Admitting: Psychiatry

## 2022-09-05 DIAGNOSIS — F5104 Psychophysiologic insomnia: Secondary | ICD-10-CM

## 2022-09-05 DIAGNOSIS — Z720 Tobacco use: Secondary | ICD-10-CM

## 2022-09-05 DIAGNOSIS — F331 Major depressive disorder, recurrent, moderate: Secondary | ICD-10-CM | POA: Diagnosis not present

## 2022-09-05 DIAGNOSIS — F4310 Post-traumatic stress disorder, unspecified: Secondary | ICD-10-CM | POA: Diagnosis not present

## 2022-09-05 DIAGNOSIS — F1021 Alcohol dependence, in remission: Secondary | ICD-10-CM

## 2022-09-05 DIAGNOSIS — Z8659 Personal history of other mental and behavioral disorders: Secondary | ICD-10-CM

## 2022-09-05 DIAGNOSIS — Z9151 Personal history of suicidal behavior: Secondary | ICD-10-CM | POA: Diagnosis not present

## 2022-09-05 DIAGNOSIS — F411 Generalized anxiety disorder: Secondary | ICD-10-CM

## 2022-09-05 DIAGNOSIS — F410 Panic disorder [episodic paroxysmal anxiety] without agoraphobia: Secondary | ICD-10-CM

## 2022-09-05 HISTORY — DX: Alcohol dependence, in remission: F10.21

## 2022-09-05 MED ORDER — LAMOTRIGINE 25 MG PO TABS: ORAL_TABLET | ORAL | 0 refills | Status: DC

## 2022-09-05 NOTE — Patient Instructions (Addendum)
Start lamictal '25mg'$  nightly for 2 weeks then increase to '50mg'$  nightly (2 tablets). [name] has made a referral for psychotherapy. Here is the website for the quit line for tobacco cessation: ShowReturn.ca
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
## 2022-09-06 ENCOUNTER — Encounter: Payer: Self-pay | Admitting: *Deleted

## 2022-09-06 MED ORDER — PEG 3350-KCL-NA BICARB-NACL 420 G PO SOLR: 4000.0000 mL | Freq: Once | ORAL | 0 refills | Status: AC

## 2022-09-06 NOTE — Telephone Encounter (Signed)
Pt has been scheduled for 10/15/22 at 9am. Instructions mailed and prep sent to the pharmacy.

## 2022-09-17 ENCOUNTER — Encounter: Payer: Self-pay | Admitting: Orthopedic Surgery

## 2022-09-17 ENCOUNTER — Ambulatory Visit (INDEPENDENT_AMBULATORY_CARE_PROVIDER_SITE_OTHER): Payer: Worker's Compensation | Admitting: Orthopedic Surgery

## 2022-09-17 VITALS — Ht 68.0 in | Wt 198.0 lb

## 2022-09-17 DIAGNOSIS — M67814 Other specified disorders of tendon, left shoulder: Secondary | ICD-10-CM

## 2022-09-17 DIAGNOSIS — M7582 Other shoulder lesions, left shoulder: Secondary | ICD-10-CM

## 2022-09-17 NOTE — Progress Notes (Signed)
Orthopaedic Postop Note  Assessment: Andrew Gillespie Sr. is a 46 y.o. male s/p left shoulder arthroscopy, subacromial decompression and biceps tenodesis  DOS: 08/05/2022  Plan: [name] is progressing appropriately.  He is no longer taking pain medication.  If he continues to have muscle spasms, and he would like some Flexeril, he will contact the clinic.  Otherwise, continue working with therapy.  Anticipate gradual progression with his range of motion and strengthening.  He states his understanding.  Follow-up in 6 weeks.  Follow-up: Return in about 6 weeks (around 10/29/2022). XR at next visit: none  Subjective:  Chief Complaint  Patient presents with   Routine Gillespie Op    Lt RCR DOS 08/05/22    History of Present Illness: Andrew Gillespie Sr. is a 46 y.o. male who presents following the above stated procedure.  Surgery was 6 weeks ago.  He has been doing well.  He has been working with physical therapy for the past 2-3 weeks.  He reports some muscle spasms in the biceps.  His range of motion is improving.    Review of Systems: No fevers or chills No numbness or tingling No Chest Pain No shortness of breath   Objective: Ht '5\' 8"'$  (1.727 m)   Wt 198 lb (89.8 kg)   BMI 30.11 kg/m   Physical Exam:  Alert and oriented.  No acute distress  Left shoulder surgical incisions have healed.  No surrounding erythema or drainage.  No tenderness to palpation.  Biceps contour is appropriate.  Forward flexion to 120 degrees without discomfort.  Some weakness in the empty can testing position.  Fingers are warm and well-perfused.  Sensation intact throughout the left hand.  IMAGING: I personally ordered and reviewed the following images:  No new imaging obtained today.  Andrew Rasmussen, MD 09/17/2022 10:59 AM

## 2022-09-23 ENCOUNTER — Telehealth: Payer: Self-pay | Admitting: Radiology

## 2022-09-23 NOTE — Telephone Encounter (Signed)
Andrew Gillespie emailed asking for a work note for patient from last visit.  What is work status now?  Or how long out thru?  Are there any additional orders?  Needs office note as well.  Please advise on all this and I can email all back to her.  Thanks.

## 2022-09-24 ENCOUNTER — Encounter: Payer: Self-pay | Admitting: Radiology

## 2022-09-24 NOTE — Progress Notes (Signed)
Work status after surgery letter made and sent to [facility].

## 2022-10-01 ENCOUNTER — Encounter (HOSPITAL_COMMUNITY): Payer: Self-pay | Admitting: Psychiatry

## 2022-10-01 ENCOUNTER — Other Ambulatory Visit: Payer: Self-pay | Admitting: Internal Medicine

## 2022-10-01 ENCOUNTER — Telehealth (INDEPENDENT_AMBULATORY_CARE_PROVIDER_SITE_OTHER): Payer: BC Managed Care – PPO | Admitting: Psychiatry

## 2022-10-01 ENCOUNTER — Encounter: Payer: Self-pay | Admitting: Internal Medicine

## 2022-10-01 DIAGNOSIS — F331 Major depressive disorder, recurrent, moderate: Secondary | ICD-10-CM | POA: Diagnosis not present

## 2022-10-01 DIAGNOSIS — F410 Panic disorder [episodic paroxysmal anxiety] without agoraphobia: Secondary | ICD-10-CM

## 2022-10-01 DIAGNOSIS — Z720 Tobacco use: Secondary | ICD-10-CM

## 2022-10-01 DIAGNOSIS — F5104 Psychophysiologic insomnia: Secondary | ICD-10-CM

## 2022-10-01 DIAGNOSIS — H02841 Edema of right upper eyelid: Secondary | ICD-10-CM | POA: Diagnosis not present

## 2022-10-01 DIAGNOSIS — Z9189 Other specified personal risk factors, not elsewhere classified: Secondary | ICD-10-CM

## 2022-10-01 DIAGNOSIS — R030 Elevated blood-pressure reading, without diagnosis of hypertension: Secondary | ICD-10-CM | POA: Diagnosis not present

## 2022-10-01 DIAGNOSIS — H02842 Edema of right lower eyelid: Secondary | ICD-10-CM | POA: Diagnosis not present

## 2022-10-01 DIAGNOSIS — F4310 Post-traumatic stress disorder, unspecified: Secondary | ICD-10-CM | POA: Diagnosis not present

## 2022-10-01 DIAGNOSIS — F411 Generalized anxiety disorder: Secondary | ICD-10-CM

## 2022-10-01 DIAGNOSIS — S0011XA Contusion of right eyelid and periocular area, initial encounter: Secondary | ICD-10-CM | POA: Diagnosis not present

## 2022-10-01 MED ORDER — GABAPENTIN 100 MG PO CAPS: 100.0000 mg | ORAL_CAPSULE | Freq: Two times a day (BID) | ORAL | 0 refills | Status: DC | PRN

## 2022-10-01 MED ORDER — LAMOTRIGINE 25 MG PO TABS: ORAL_TABLET | ORAL | 0 refills | Status: DC
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
## 2022-10-01 NOTE — Patient Instructions (Addendum)
Here is the DBT workbook: AffordableShare.co.za.pdf It should be helpful in identifying thoughts that are driving anxiety. We added gabapentin '100mg'$  twice daily as needed for anxiety to your regimen.  Finish up your '50mg'$  dose of lamictal (lamotrigine) before starting the '75mg'$  dose nightly. Once you are done with the '75mg'$  dose, we will start the '100mg'$  nightly.

## 2022-10-08 ENCOUNTER — Encounter (HOSPITAL_COMMUNITY): Payer: Self-pay

## 2022-10-08 ENCOUNTER — Ambulatory Visit (INDEPENDENT_AMBULATORY_CARE_PROVIDER_SITE_OTHER): Payer: BC Managed Care – PPO | Admitting: Clinical

## 2022-10-08 DIAGNOSIS — F4310 Post-traumatic stress disorder, unspecified: Secondary | ICD-10-CM | POA: Diagnosis not present

## 2022-10-08 DIAGNOSIS — F410 Panic disorder [episodic paroxysmal anxiety] without agoraphobia: Secondary | ICD-10-CM

## 2022-10-08 DIAGNOSIS — F411 Generalized anxiety disorder: Secondary | ICD-10-CM | POA: Diagnosis not present

## 2022-10-08 DIAGNOSIS — F331 Major depressive disorder, recurrent, moderate: Secondary | ICD-10-CM | POA: Diagnosis not present
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
## 2022-10-11 ENCOUNTER — Telehealth: Payer: Self-pay | Admitting: Radiology

## 2022-10-11 ENCOUNTER — Encounter: Payer: Self-pay | Admitting: Internal Medicine

## 2022-10-11 ENCOUNTER — Ambulatory Visit (INDEPENDENT_AMBULATORY_CARE_PROVIDER_SITE_OTHER): Payer: BC Managed Care – PPO | Admitting: Internal Medicine

## 2022-10-11 VITALS — BP 120/75 | HR 93 | Ht 69.0 in | Wt 204.2 lb

## 2022-10-11 DIAGNOSIS — F3181 Bipolar II disorder: Secondary | ICD-10-CM

## 2022-10-11 DIAGNOSIS — R748 Abnormal levels of other serum enzymes: Secondary | ICD-10-CM

## 2022-10-11 DIAGNOSIS — E782 Mixed hyperlipidemia: Secondary | ICD-10-CM

## 2022-10-11 DIAGNOSIS — Z0001 Encounter for general adult medical examination with abnormal findings: Secondary | ICD-10-CM | POA: Diagnosis not present

## 2022-10-11 DIAGNOSIS — S46012S Strain of muscle(s) and tendon(s) of the rotator cuff of left shoulder, sequela: Secondary | ICD-10-CM

## 2022-10-11 DIAGNOSIS — K219 Gastro-esophageal reflux disease without esophagitis: Secondary | ICD-10-CM

## 2022-10-11 NOTE — Patient Instructions (Signed)
It was a pleasure to see you today.  Thank you for giving [place] the opportunity to be involved in your care.  Below is a brief recap of your visit and next steps.  We will plan to see you again in 6 months.  Summary No medication changes today I have placed a referral to nutrition to discuss dieting practices We will repeat labs Plan for follow up in 6 months

## 2022-10-11 NOTE — Progress Notes (Unsigned)
Established Patient Office Visit  Subjective   Patient ID: Andrew Goldman Sr., male    DOB: July 15, 1976  Age: 46 y.o. MRN: [id number]  Chief Complaint  Patient presents with   Hyperlipidemia    Follow up   Andrew Gillespie returns to care today.  He was last seen by me on 9/8 to establish care.  Baseline labs were ordered and he was referred to gastroenterology and psychiatry for treatment of refractory GERD and BPD 2 respectively.  In the interim he has undergone left rotator cuff repair and has established care with psychiatry. Today Andrew Gillespie reports that things are going well.  He is making progress in physical therapy following left rotator cuff repair.  He has establish care with psychiatry and reports that his mood is currently stable.  He is scheduled to undergo EGD/colonoscopy with GI next week.  Past Medical History:  Diagnosis Date   Hyperlipidemia    Hypoglycemia    PONV (postoperative nausea and vomiting)    Stroke ([place])    mini stroke 2008   Past Surgical History:  Procedure Laterality Date   APPENDECTOMY     HERNIA REPAIR     SHOULDER ARTHROSCOPY WITH ROTATOR CUFF REPAIR Left 08/05/2022   Procedure: SHOULDER ARTHROSCOPY WITH DEBRIDEMENT AND BICEPS TENODESIS;  Surgeon: Mordecai Rasmussen, MD;  Location: [facility];  Service: Orthopedics;  Laterality: Left;   Social History   Tobacco Use   Smoking status: Every Day    Packs/day: 0.75    Types: Cigarettes    Start date: 11/04/1988   Smokeless tobacco: Former    Quit date: 11/13/2012  Vaping Use   Vaping Use: Never used  Substance Use Topics   Alcohol use: Not Currently    Comment: 31 years sober as of 2023   Drug use: No   Family History  Problem Relation Age of Onset   Diabetes Mother    Mental illness Mother    Diabetes Father    Heart attack Father    Mental illness Father    Colon polyps Father        frequent colonoscopies, every 2 years   Diabetes Sister    Mental illness Brother    Diabetes Maternal Grandmother     Stroke Maternal Grandmother    Diabetes Maternal Grandfather    Heart attack Maternal Grandfather    Congestive Heart Failure Maternal Grandfather    Heart attack Paternal Grandmother    Heart attack Paternal Grandfather    Colon cancer Neg Hx    Allergies  Allergen Reactions   Penicillins Anaphylaxis and Hives    Has patient had a PCN reaction causing immediate rash, facial/tongue/throat swelling, SOB or lightheadedness with hypotension: yes Has patient had a PCN reaction causing severe rash involving mucus membranes or skin necrosis: yes Has patient had a PCN reaction that required hospitalization: unknown Has patient had a PCN reaction occurring within the last 10 years: No If all of the above answers are "NO", then may proceed with Cephalosporin use.    Dimetapp [Albertsons Di Bromm] Other (See Comments)    Child hood allergy   Promethazine Hcl Other (See Comments)    paranoid   Wellbutrin [Bupropion] Anxiety   Review of Systems  Constitutional:  Negative for chills and fever.  HENT:  Negative for sore throat.   Respiratory:  Negative for cough and shortness of breath.   Cardiovascular:  Negative for chest pain, palpitations and leg swelling.  Gastrointestinal:  Negative for abdominal  pain, blood in stool, constipation, diarrhea, nausea and vomiting.  Genitourinary:  Negative for dysuria and hematuria.  Musculoskeletal:  Negative for myalgias.  Skin:  Negative for itching and rash.  Neurological:  Negative for dizziness and headaches.  Psychiatric/Behavioral:  Negative for depression and suicidal ideas.      Objective:     BP 120/75   Pulse 93   Ht '5\' 9"'$  (1.753 m)   Wt 204 lb 3.2 oz (92.6 kg)   SpO2 94%   BMI 30.16 kg/m  BP Readings from Last 3 Encounters:  10/15/22 94/67  10/11/22 120/75  08/05/22 (!) 151/95   Physical Exam Vitals reviewed.  Constitutional:      General: He is not in acute distress.    Appearance: Normal appearance. He is not  ill-appearing.  HENT:     Head: Normocephalic and atraumatic.     Right Ear: External ear normal.     Left Ear: External ear normal.     Nose: Nose normal. No congestion or rhinorrhea.     Mouth/Throat:     Mouth: Mucous membranes are moist.     Pharynx: Oropharynx is clear.  Eyes:     Extraocular Movements: Extraocular movements intact.     Conjunctiva/sclera: Conjunctivae normal.     Pupils: Pupils are equal, round, and reactive to light.  Cardiovascular:     Rate and Rhythm: Normal rate and regular rhythm.     Pulses: Normal pulses.     Heart sounds: Normal heart sounds. No murmur heard. Pulmonary:     Effort: Pulmonary effort is normal.     Breath sounds: Normal breath sounds. No wheezing, rhonchi or rales.  Abdominal:     General: Abdomen is flat. Bowel sounds are normal. There is no distension.     Palpations: Abdomen is soft.     Tenderness: There is no abdominal tenderness.  Musculoskeletal:        General: No swelling or deformity.     Cervical back: Normal range of motion.     Comments: Able to actively abduct L shoulder to 110 degrees.  Skin:    General: Skin is warm and dry.     Capillary Refill: Capillary refill takes less than 2 seconds.  Neurological:     General: No focal deficit present.     Mental Status: He is alert and oriented to person, place, and time.     Motor: No weakness.  Psychiatric:        Mood and Affect: Mood normal.        Behavior: Behavior normal.        Thought Content: Thought content normal.    Last CBC Lab Results  Component Value Date   WBC 7.6 08/01/2022   HGB 14.7 08/01/2022   HCT 43.2 08/01/2022   MCV 92.3 08/01/2022   MCH 31.4 08/01/2022   RDW 12.5 08/01/2022   PLT 235 37/85/8850   Last metabolic panel Lab Results  Component Value Date   GLUCOSE 104 (H) 10/11/2022   NA 142 10/11/2022   K 4.4 10/11/2022   CL 105 10/11/2022   CO2 18 (L) 10/11/2022   BUN 8 10/11/2022   CREATININE 0.90 10/11/2022   GFRNONAA >60  08/01/2022   CALCIUM 9.4 10/11/2022   PROT 6.9 10/11/2022   ALBUMIN 4.6 10/11/2022   LABGLOB 2.3 10/11/2022   AGRATIO 2.0 10/11/2022   BILITOT 0.2 10/11/2022   ALKPHOS 139 (H) 10/11/2022   AST 19 10/11/2022   ALT 43 10/11/2022  ANIONGAP 8 08/01/2022   Last lipids Lab Results  Component Value Date   CHOL 168 10/11/2022   HDL 41 10/11/2022   LDLCALC 61 10/11/2022   TRIG 432 (H) 10/11/2022   CHOLHDL 4.1 10/11/2022   Last hemoglobin A1c Lab Results  Component Value Date   HGBA1C 5.3 07/12/2022   Last thyroid functions Lab Results  Component Value Date   TSH 2.150 08/03/2015   Last vitamin B12 and Folate Lab Results  Component Value Date   VITAMINB12 514 07/12/2022   FOLATE 9.6 07/12/2022   The 10-year ASCVD risk score (Arnett DK, et al., 2019) is: 3%    Assessment & Plan:   Problem List Items Addressed This Visit       Esophageal reflux    Currently prescribed Protonix 40 mg twice daily for treatment of refractory GERD.  He has previously been referred to GI and will undergo EGD for further evaluation next week (12/12).      Left rotator cuff tear    S/p left rotator cuff repair following complete, traumatic tear. Progressing with PT. He has follow up with orthopedic surgery scheduled for early January.       Bipolar 2 disorder, major depressive episode (HCC)    Mood is stable currently.  He was previously referred to psychiatry for management of BPD 2, PTSD, and anxiety/depression.  He has establish care and was seen on 11/2.  He currently has follow-up scheduled for early January.      Hyperlipidemia - Primary    Lipid panel ordered in September and shows total cholesterol 247, LDL 131.  Atorvastatin 20 mg daily was started as a result. -Repeat lipid panel ordered today -He has requested a referral to nutrition to discuss appropriate dieting practices      Return in about 6 months (around 04/12/2023).    Johnette Abraham, MD

## 2022-10-11 NOTE — Telephone Encounter (Signed)
[facility] called and asked [place] to fax 09/17/22 note, any orders and patient's RTW note.

## 2022-10-12 LAB — CMP14+EGFR
ALT: 43 IU/L (ref 0–44)
AST: 19 IU/L (ref 0–40)
Albumin/Globulin Ratio: 2 (ref 1.2–2.2)
Albumin: 4.6 g/dL (ref 4.1–5.1)
Alkaline Phosphatase: 139 IU/L — ABNORMAL HIGH (ref 44–121)
BUN/Creatinine Ratio: 9 (ref 9–20)
BUN: 8 mg/dL (ref 6–24)
Bilirubin Total: 0.2 mg/dL (ref 0.0–1.2)
CO2: 18 mmol/L — ABNORMAL LOW (ref 20–29)
Calcium: 9.4 mg/dL (ref 8.7–10.2)
Chloride: 105 mmol/L (ref 96–106)
Creatinine, Ser: 0.9 mg/dL (ref 0.76–1.27)
Globulin, Total: 2.3 g/dL (ref 1.5–4.5)
Glucose: 104 mg/dL — ABNORMAL HIGH (ref 70–99)
Potassium: 4.4 mmol/L (ref 3.5–5.2)
Sodium: 142 mmol/L (ref 134–144)
Total Protein: 6.9 g/dL (ref 6.0–8.5)
eGFR: 107 mL/min/{1.73_m2} (ref >59)

## 2022-10-12 LAB — LIPID PANEL
Chol/HDL Ratio: 4.1 ratio (ref 0.0–5.0)
Cholesterol, Total: 168 mg/dL (ref 100–199)
HDL: 41 mg/dL (ref >39)
LDL Chol Calc (NIH): 61 mg/dL (ref 0–99)
Triglycerides: 432 mg/dL — ABNORMAL HIGH (ref 0–149)
VLDL Cholesterol Cal: 66 mg/dL — ABNORMAL HIGH (ref 5–40)

## 2022-10-14 ENCOUNTER — Encounter (HOSPITAL_COMMUNITY): Payer: Self-pay | Admitting: Psychiatry

## 2022-10-14 ENCOUNTER — Telehealth (INDEPENDENT_AMBULATORY_CARE_PROVIDER_SITE_OTHER): Payer: BC Managed Care – PPO | Admitting: Psychiatry

## 2022-10-14 DIAGNOSIS — F331 Major depressive disorder, recurrent, moderate: Secondary | ICD-10-CM | POA: Diagnosis not present

## 2022-10-14 DIAGNOSIS — F4310 Post-traumatic stress disorder, unspecified: Secondary | ICD-10-CM

## 2022-10-14 DIAGNOSIS — Z9151 Personal history of suicidal behavior: Secondary | ICD-10-CM

## 2022-10-14 DIAGNOSIS — F410 Panic disorder [episodic paroxysmal anxiety] without agoraphobia: Secondary | ICD-10-CM

## 2022-10-14 DIAGNOSIS — F411 Generalized anxiety disorder: Secondary | ICD-10-CM

## 2022-10-14 DIAGNOSIS — F5104 Psychophysiologic insomnia: Secondary | ICD-10-CM

## 2022-10-14 DIAGNOSIS — Z720 Tobacco use: Secondary | ICD-10-CM

## 2022-10-14 DIAGNOSIS — F17210 Nicotine dependence, cigarettes, uncomplicated: Secondary | ICD-10-CM

## 2022-10-14 MED ORDER — GABAPENTIN 100 MG PO CAPS: 200.0000 mg | ORAL_CAPSULE | Freq: Two times a day (BID) | ORAL | 0 refills | Status: DC | PRN

## 2022-10-14 MED ORDER — LAMOTRIGINE 100 MG PO TABS: 100.0000 mg | ORAL_TABLET | Freq: Every day | ORAL | 1 refills | Status: DC

## 2022-10-14 NOTE — Patient Instructions (Signed)
It is ok to increase your gabapentin to '200mg'$  twice daily as needed for insomnia and anxiety. Once you are done with your '75mg'$  dose of lamictal, start your '100mg'$  once nightly dosing thereafter.
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
## 2022-10-15 ENCOUNTER — Other Ambulatory Visit: Payer: Self-pay

## 2022-10-15 ENCOUNTER — Encounter (HOSPITAL_COMMUNITY)
Admission: RE | Disposition: A | Discharge status: Home / Self Care | Payer: Self-pay | Source: Home / Self Care | Attending: Internal Medicine

## 2022-10-15 ENCOUNTER — Ambulatory Visit (HOSPITAL_COMMUNITY): Payer: BC Managed Care – PPO | Admitting: Anesthesiology

## 2022-10-15 ENCOUNTER — Encounter (HOSPITAL_COMMUNITY): Payer: Self-pay

## 2022-10-15 ENCOUNTER — Ambulatory Visit (HOSPITAL_COMMUNITY)
Admission: RE | Admit: 2022-10-15 | Discharge: 2022-10-15 | Disposition: A | Discharge status: Home / Self Care | Payer: BC Managed Care – PPO | Attending: Internal Medicine | Admitting: Internal Medicine

## 2022-10-15 DIAGNOSIS — Z8673 Personal history of transient ischemic attack (TIA), and cerebral infarction without residual deficits: Secondary | ICD-10-CM | POA: Insufficient documentation

## 2022-10-15 DIAGNOSIS — Z1211 Encounter for screening for malignant neoplasm of colon: Secondary | ICD-10-CM | POA: Insufficient documentation

## 2022-10-15 DIAGNOSIS — R1310 Dysphagia, unspecified: Secondary | ICD-10-CM | POA: Diagnosis not present

## 2022-10-15 DIAGNOSIS — K449 Diaphragmatic hernia without obstruction or gangrene: Secondary | ICD-10-CM | POA: Insufficient documentation

## 2022-10-15 DIAGNOSIS — F17210 Nicotine dependence, cigarettes, uncomplicated: Secondary | ICD-10-CM | POA: Diagnosis not present

## 2022-10-15 DIAGNOSIS — K200 Eosinophilic esophagitis: Secondary | ICD-10-CM | POA: Diagnosis not present

## 2022-10-15 DIAGNOSIS — R12 Heartburn: Secondary | ICD-10-CM | POA: Diagnosis not present

## 2022-10-15 DIAGNOSIS — K635 Polyp of colon: Secondary | ICD-10-CM | POA: Diagnosis not present

## 2022-10-15 DIAGNOSIS — K222 Esophageal obstruction: Secondary | ICD-10-CM | POA: Diagnosis not present

## 2022-10-15 DIAGNOSIS — D123 Benign neoplasm of transverse colon: Secondary | ICD-10-CM | POA: Diagnosis not present

## 2022-10-15 DIAGNOSIS — K2970 Gastritis, unspecified, without bleeding: Secondary | ICD-10-CM | POA: Insufficient documentation

## 2022-10-15 DIAGNOSIS — Z1212 Encounter for screening for malignant neoplasm of rectum: Secondary | ICD-10-CM | POA: Diagnosis not present

## 2022-10-15 HISTORY — PX: POLYPECTOMY: SHX5525

## 2022-10-15 HISTORY — PX: BIOPSY: SHX5522

## 2022-10-15 HISTORY — PX: COLONOSCOPY WITH PROPOFOL: SHX5780

## 2022-10-15 HISTORY — PX: BALLOON DILATION: SHX5330

## 2022-10-15 HISTORY — PX: ESOPHAGOGASTRODUODENOSCOPY (EGD) WITH PROPOFOL: SHX5813

## 2022-10-15 SURGERY — COLONOSCOPY WITH PROPOFOL
Anesthesia: General

## 2022-10-15 MED ORDER — STERILE WATER FOR IRRIGATION IR SOLN
Status: DC | PRN
  Administered 2022-10-15: 180 mL

## 2022-10-15 MED ORDER — LIDOCAINE 2% (20 MG/ML) 5 ML SYRINGE
INTRAMUSCULAR | Status: DC | PRN
  Administered 2022-10-15: 50 mg via INTRAVENOUS

## 2022-10-15 MED ORDER — PROPOFOL 500 MG/50ML IV EMUL
INTRAVENOUS | Status: DC | PRN
  Administered 2022-10-15: 175 ug/kg/min via INTRAVENOUS

## 2022-10-15 MED ORDER — LACTATED RINGERS IV SOLN: INTRAVENOUS | Status: DC | PRN

## 2022-10-15 MED ORDER — PROPOFOL 10 MG/ML IV BOLUS
INTRAVENOUS | Status: DC | PRN
  Administered 2022-10-15: 50 mg via INTRAVENOUS
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
## 2022-10-15 NOTE — Op Note (Signed)
[facility] Patient Name: Andrew Gillespie Procedure Date: 10/15/2022 7:07 AM MRN: [id number] Date of Birth: May 15, 1976 Attending MD: Elon Alas. Abbey Chatters , [place], [id number] CSN: [id number] Age: 46 Admit Type: Outpatient Procedure:                Colonoscopy Indications:              Screening for colorectal malignant neoplasm Providers:                Elon Alas. Abbey Chatters, DO, Hughie Closs RN, RN, Wynonia Musty Tech, Technician Referring MD:              Medicines:                See the Anesthesia note for documentation of the                            administered medications Complications:            No immediate complications. Estimated Blood Loss:     Estimated blood loss was minimal. Procedure:                Pre-Anesthesia Assessment:                           - The anesthesia plan was to use monitored                            anesthesia care (MAC).                           After obtaining informed consent, the colonoscope                            was passed under direct vision. Throughout the                            procedure, the patient's blood pressure, pulse, and                            oxygen saturations were monitored continuously. The                            [id number]-HQ190L ([id number]) scope was introduced through                            the anus and advanced to the the cecum, identified                            by appendiceal orifice and ileocecal valve. The                            colonoscopy was performed without difficulty. The                            patient tolerated the  procedure well. The quality                            of the bowel preparation was evaluated using the                            BBPS [facility] Bowel Preparation Scale) with scores                            of: Right Colon = 3, Transverse Colon = 3 and Left                            Colon = 3 (entire mucosa seen well with no residual                             staining, small fragments of stool or opaque                            liquid). The total BBPS score equals 9. Scope In: 8:45:45 AM Scope Out: 9:00:54 AM Scope Withdrawal Time: 0 hours 12 minutes 43 seconds  Total Procedure Duration: 0 hours 15 minutes 9 seconds  Findings:      The perianal and digital rectal examinations were normal.      A 3 mm polyp was found in the transverse colon. The polyp was sessile.       The polyp was removed with a cold snare. Resection and retrieval were       complete.      Three sessile polyps were found in the sigmoid colon. The polyps were 3       to 4 mm in size. These polyps were removed with a cold snare. Resection       and retrieval were complete.      The exam was otherwise without abnormality. Impression:               - One 3 mm polyp in the transverse colon, removed                            with a cold snare. Resected and retrieved.                           - Three 3 to 4 mm polyps in the sigmoid colon,                            removed with a cold snare. Resected and retrieved.                           - The examination was otherwise normal. Moderate Sedation:      Per Anesthesia Care Recommendation:           - Patient has a contact number available for                            emergencies. The signs and symptoms of potential  delayed complications were discussed with the                            patient. Return to normal activities tomorrow.                            Written discharge instructions were provided to the                            patient.                           - Resume previous diet.                           - Continue present medications.                           - Await pathology results.                           - Repeat colonoscopy in 5-10 years for surveillance.                           - Return to GI clinic in 3 months. Procedure Code(s):        --- Professional ---                            [phone], Colonoscopy, flexible; with removal of                            tumor(s), polyp(s), or other lesion(s) by snare                            technique Diagnosis Code(s):        --- Professional ---                           Z12.11, Encounter for screening for malignant                            neoplasm of colon                           D12.3, Benign neoplasm of transverse colon (hepatic                            flexure or splenic flexure)                           D12.5, Benign neoplasm of sigmoid colon CPT copyright 2022 American Medical Association. All rights reserved. The codes documented in this report are preliminary and upon coder review may  be revised to meet current compliance requirements. Elon Alas. Abbey Chatters, DO [place] Abbey Chatters, DO 10/15/2022 9:03:21 AM This report has been signed electronically. Number of Addenda: 0

## 2022-10-15 NOTE — H&P (Signed)
Primary Care Physician:  Johnette Abraham, MD Primary Gastroenterologist:  Dr. Abbey Chatters  Pre-Procedure History & Physical: HPI:  Andrew Wombles Sr. is a 46 y.o. male is here for an EGD with possible dilation to be performed for GERD, dysphagia and colonoscopy for colon cancer screening.   Past Medical History:  Diagnosis Date   Hyperlipidemia    Hypoglycemia    PONV (postoperative nausea and vomiting)    Stroke ([place])    mini stroke 2008    Past Surgical History:  Procedure Laterality Date   APPENDECTOMY     HERNIA REPAIR     SHOULDER ARTHROSCOPY WITH ROTATOR CUFF REPAIR Left 08/05/2022   Procedure: SHOULDER ARTHROSCOPY WITH DEBRIDEMENT AND BICEPS TENODESIS;  Surgeon: Mordecai Rasmussen, MD;  Location: [facility];  Service: Orthopedics;  Laterality: Left;    Prior to Admission medications   Medication Sig Start Date End Date Taking? Authorizing Provider  acetaminophen (TYLENOL) 500 MG tablet Take 1,000 mg by mouth every 6 (six) hours as needed for moderate pain.   Yes [provider]  aspirin EC 81 MG tablet Take 81 mg by mouth 2 (two) times a week. Swallow whole.   Yes [provider]  atorvastatin (LIPITOR) 20 MG tablet Take 1 tablet (20 mg total) by mouth daily. 07/15/22  Yes Johnette Abraham, MD  gabapentin (NEURONTIN) 100 MG capsule Take 2 capsules (200 mg total) by mouth 2 (two) times daily as needed (anxiety, insomnia). 10/14/22 11/13/22 Yes Jacquelynn Cree, MD  ibuprofen (ADVIL) 200 MG tablet Take 400 mg by mouth every 6 (six) hours as needed for moderate pain.   Yes [provider]  lamoTRIgine (LAMICTAL) 100 MG tablet Take 1 tablet (100 mg total) by mouth daily. 10/14/22  Yes Jacquelynn Cree, MD  pantoprazole (PROTONIX) 40 MG tablet Take 1 tablet (40 mg total) by mouth 2 (two) times daily before a meal. 07/30/22  Yes Mahala Menghini, PA-C    Allergies as of 09/06/2022 - Review Complete 09/05/2022  Allergen Reaction Noted   Penicillins Anaphylaxis and  Hives 08/17/2011   Dimetapp [albertsons di bromm] Other (See Comments) 08/17/2011   Promethazine hcl Other (See Comments) 08/17/2011   Wellbutrin [bupropion] Anxiety 09/26/2016    Family History  Problem Relation Age of Onset   Diabetes Mother    Mental illness Mother    Diabetes Father    Heart attack Father    Mental illness Father    Colon polyps Father        frequent colonoscopies, every 2 years   Diabetes Sister    Mental illness Brother    Diabetes Maternal Grandmother    Stroke Maternal Grandmother    Diabetes Maternal Grandfather    Heart attack Maternal Grandfather    Congestive Heart Failure Maternal Grandfather    Heart attack Paternal Grandmother    Heart attack Paternal Grandfather    Colon cancer Neg Hx     Social History   Socioeconomic History   Marital status: Married    Spouse name: Not on file   Number of children: Not on file   Years of education: Not on file   Highest education level: Not on file  Occupational History   Not on file  Tobacco Use   Smoking status: Every Day    Packs/day: 0.75    Types: Cigarettes    Start date: 11/04/1988   Smokeless tobacco: Former    Quit date: 11/13/2012  Vaping Use   Vaping Use:  Never used  Substance and Sexual Activity   Alcohol use: Not Currently    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 Eyes: Negative for vision changes.  ENT: Negative for hoarseness, difficulty swallowing , nasal congestion. CV: Negative for chest pain, angina, palpitations,  dyspnea on exertion, peripheral edema.  Respiratory: Negative for dyspnea at rest, dyspnea on exertion, cough, sputum, wheezing.  GI: See history of present illness. GU:  Negative for dysuria, hematuria, urinary incontinence, urinary frequency, nocturnal urination.  MS: Negative for joint pain, low back pain.  Derm: Negative for rash or itching.  Neuro: Negative for weakness, abnormal sensation, seizure, frequent headaches, memory loss, confusion.  Psych: Negative for anxiety, depression Endo: Negative for unusual weight change.  Heme: Negative for bruising or bleeding. Allergy: Negative for rash or hives.  Physical Exam: Vital signs in last 24 hours: Temp:  [98 F (36.7 C)] 98 F (36.7 C) (12/12 0745) Pulse Rate:  [86] 86 (12/12 0745) Resp:  [15] 15 (12/12 0745) BP: (118)/(90) 118/90 (12/12 0745) SpO2:  [98 %] 98 % (12/12 0745) Weight:  [93 kg] 93 kg (12/12 0745)   General:   Alert,  Well-developed, well-nourished, pleasant and cooperative in NAD Head:  Normocephalic and atraumatic. Eyes:  Sclera clear, no icterus.   Conjunctiva pink. Ears:  Normal auditory acuity. Nose:  No deformity, discharge,  or lesions. Msk:  Symmetrical without gross deformities. Normal posture. Extremities:  Without clubbing or edema. Neurologic:  Alert and  oriented x4;  grossly normal neurologically. Skin:  Intact without significant lesions or rashes. Psych:  Alert and cooperative. Normal mood and affect.   Impression/Plan: Andrew Mode Sr. is here for an EGD with possible dilation to be performed for GERD, dysphagia and colonoscopy for colon cancer screening.   Risks, benefits, limitations, imponderables and alternatives regarding EGD have been reviewed with the patient. Questions have been answered. All parties agreeable.

## 2022-10-15 NOTE — Discharge Instructions (Addendum)
EGD Discharge instructions Please read the instructions outlined below and refer to this sheet in the next few weeks. These discharge instructions provide you with general information on caring for yourself after you leave the hospital. Your doctor may also give you specific instructions. While your treatment has been planned according to the most current medical practices available, unavoidable complications occasionally occur. If you have any problems or questions after discharge, please call your doctor. ACTIVITY You may resume your regular activity but move at a slower pace for the next 24 hours.  Take frequent rest periods for the next 24 hours.  Walking will help expel (get rid of) the air and reduce the bloated feeling in your abdomen.  No driving for 24 hours (because of the anesthesia (medicine) used during the test).  You may shower.  Do not sign any important legal documents or operate any machinery for 24 hours (because of the anesthesia used during the test).  NUTRITION Drink plenty of fluids.  You may resume your normal diet.  Begin with a light meal and progress to your normal diet.  Avoid alcoholic beverages for 24 hours or as instructed by your caregiver.  MEDICATIONS You may resume your normal medications unless your caregiver tells you otherwise.  WHAT YOU CAN EXPECT TODAY You may experience abdominal discomfort such as a feeling of fullness or "gas" pains.  FOLLOW-UP Your doctor will discuss the results of your test with you.  SEEK IMMEDIATE MEDICAL ATTENTION IF ANY OF THE FOLLOWING OCCUR: Excessive nausea (feeling sick to your stomach) and/or vomiting.  Severe abdominal pain and distention (swelling).  Trouble swallowing.  Temperature over 101 F (37.8 C).  Rectal bleeding or vomiting of blood.     Colonoscopy Discharge Instructions  Read the instructions outlined below and refer to this sheet in the next few weeks. These discharge instructions provide you with  general information on caring for yourself after you leave the hospital. Your doctor may also give you specific instructions. While your treatment has been planned according to the most current medical practices available, unavoidable complications occasionally occur.   ACTIVITY You may resume your regular activity, but move at a slower pace for the next 24 hours.  Take frequent rest periods for the next 24 hours.  Walking will help get rid of the air and reduce the bloated feeling in your belly (abdomen).  No driving for 24 hours (because of the medicine (anesthesia) used during the test).   Do not sign any important legal documents or operate any machinery for 24 hours (because of the anesthesia used during the test).  NUTRITION Drink plenty of fluids.  You may resume your normal diet as instructed by your doctor.  Begin with a light meal and progress to your normal diet. Heavy or fried foods are harder to digest and may make you feel sick to your stomach (nauseated).  Avoid alcoholic beverages for 24 hours or as instructed.  MEDICATIONS You may resume your normal medications unless your doctor tells you otherwise.  WHAT YOU CAN EXPECT TODAY Some feelings of bloating in the abdomen.  Passage of more gas than usual.  Spotting of blood in your stool or on the toilet paper.  IF YOU HAD POLYPS REMOVED DURING THE COLONOSCOPY: No aspirin products for 7 days or as instructed.  No alcohol for 7 days or as instructed.  Eat a soft diet for the next 24 hours.  FINDING OUT THE RESULTS OF YOUR TEST Not all test results are  available during your visit. If your test results are not back during the visit, make an appointment with your caregiver to find out the results. Do not assume everything is normal if you have not heard from your caregiver or the medical facility. It is important for you to follow up on all of your test results.  SEEK IMMEDIATE MEDICAL ATTENTION IF: You have more than a spotting of  blood in your stool.  Your belly is swollen (abdominal distention).  You are nauseated or vomiting.  You have a temperature over 101.  You have abdominal pain or discomfort that is severe or gets worse throughout the day.   Your EGD revealed mild amount inflammation in your stomach.  I took biopsies of this to rule out infection with a bacteria called H. pylori.  Await pathology results, my office will contact you.  You have a small hiatal hernia.  You also had a tightening of your esophagus called a Schatzki's ring related to chronic reflux.  I stretched this today.  Hopefully this helps with feeling of food getting stuck in your chest.  Continue on pantoprazole twice daily.  Your colonoscopy revealed 4 polyps which I removed successfully.  These were all small, likely benign hyperplastic polyps.  Repeat colonoscopy in 5 to 10 years depending on pathology results.  Follow-up with GI in 3 months.  I hope you have a great rest of your week!  Andrew Gillespie. Andrew Gillespie, D.O. Gastroenterology and Hepatology [facility]

## 2022-10-15 NOTE — Op Note (Signed)
[facility] Patient Name: Andrew Gillespie Procedure Date: 10/15/2022 7:15 AM MRN: [id number] Date of Birth: 1976/01/02 Attending MD: Elon Alas. Abbey Chatters , [place], [id number] CSN: [id number] Age: 46 Admit Type: Outpatient Procedure:                Upper GI endoscopy Indications:              Dysphagia, Heartburn Providers:                Elon Alas. Abbey Chatters, DO, Hughie Closs RN, RN, Wynonia Musty Tech, Technician Referring MD:              Medicines:                See the Anesthesia note for documentation of the                            administered medications Complications:            No immediate complications. Estimated Blood Loss:     Estimated blood loss was minimal. Procedure:                Pre-Anesthesia Assessment:                           - The anesthesia plan was to use monitored                            anesthesia care (MAC).                           After obtaining informed consent, the endoscope was                            passed under direct vision. Throughout the                            procedure, the patient's blood pressure, pulse, and                            oxygen saturations were monitored continuously. The                            GIF-H190 ([id number]) scope was introduced through the                            mouth, and advanced to the second part of duodenum.                            The upper GI endoscopy was accomplished without                            difficulty. The patient tolerated the procedure                            well. Scope In:  8:36:00 AM Scope Out: 8:40:53 AM Total Procedure Duration: 0 hours 4 minutes 53 seconds  Findings:      A 2 cm hiatal hernia was present.      A moderate Schatzki ring was found in the distal esophagus. A TTS       dilator was passed through the scope. Dilation with an 18-19-20 mm       balloon dilator was performed to 18 mm. The dilation site was examined       and showed  mild mucosal disruption and moderate improvement in luminal       narrowing.      Patchy mild inflammation characterized by erythema was found in the       gastric body. Biopsies were taken with a cold forceps for Helicobacter       pylori testing.      The duodenal bulb, first portion of the duodenum and second portion of       the duodenum were normal. Impression:               - 2 cm hiatal hernia.                           - Moderate Schatzki ring. Dilated.                           - Gastritis. Biopsied.                           - Normal duodenal bulb, first portion of the                            duodenum and second portion of the duodenum. Moderate Sedation:      Per Anesthesia Care Recommendation:           - Patient has a contact number available for                            emergencies. The signs and symptoms of potential                            delayed complications were discussed with the                            patient. Return to normal activities tomorrow.                            Written discharge instructions were provided to the                            patient.                           - Resume previous diet.                           - Continue present medications.                           - Await pathology  results.                           - Repeat upper endoscopy PRN for retreatment.                           - Return to GI clinic in 3 months.                           - Use Protonix (pantoprazole) 40 mg PO BID. Procedure Code(s):        --- Professional ---                           [phone], Esophagogastroduodenoscopy, flexible,                            transoral; with transendoscopic balloon dilation of                            esophagus (less than 30 mm diameter)                           43239, 59, Esophagogastroduodenoscopy, flexible,                            transoral; with biopsy, single or multiple Diagnosis Code(s):        ---  Professional ---                           K44.9, Diaphragmatic hernia without obstruction or                            gangrene                           K22.2, Esophageal obstruction                           K29.70, Gastritis, unspecified, without bleeding                           R13.10, Dysphagia, unspecified                           R12, Heartburn CPT copyright 2022 American Medical Association. All rights reserved. The codes documented in this report are preliminary and upon coder review may  be revised to meet current compliance requirements. Elon Alas. Abbey Chatters, DO [name], DO 10/15/2022 8:44:24 AM This report has been signed electronically. Number of Addenda: 0

## 2022-10-15 NOTE — Anesthesia Postprocedure Evaluation (Signed)
Anesthesia Post Note  Patient: Andrew Varden Lineman Sr.  Procedure(s) Performed: COLONOSCOPY WITH PROPOFOL ESOPHAGOGASTRODUODENOSCOPY (EGD) WITH PROPOFOL BALLOON DILATION POLYPECTOMY BIOPSY  Patient location during evaluation: Endoscopy Anesthesia Type: General Level of consciousness: awake and alert Pain management: pain level controlled Vital Signs Assessment: post-procedure vital signs reviewed and stable Respiratory status: spontaneous breathing, nonlabored ventilation, respiratory function stable and patient connected to nasal cannula oxygen Cardiovascular status: blood pressure returned to baseline and stable Postop Assessment: no apparent nausea or vomiting Anesthetic complications: no   There were no known notable events for this encounter.   Last Vitals:  Vitals:   10/15/22 0745 10/15/22 0906  BP: (!) 118/90 94/67  Pulse: 86 88  Resp: 15 16  Temp: 36.7 C (!) 36.4 C  SpO2: 98% 95%    Last Pain:  Vitals:   10/15/22 0906  TempSrc: Tympanic  PainSc:                  Trixie Rude

## 2022-10-15 NOTE — Transfer of Care (Signed)
Immediate Anesthesia Transfer of Care Note  Patient: Andrew Maertens Boehm Sr.  Procedure(s) Performed: COLONOSCOPY WITH PROPOFOL ESOPHAGOGASTRODUODENOSCOPY (EGD) WITH PROPOFOL BALLOON DILATION POLYPECTOMY BIOPSY  Patient Location: PACU  Anesthesia Type:General  Level of Consciousness: sedated  Airway & Oxygen Therapy: Patient Spontanous Breathing and Patient connected to nasal cannula oxygen  Post-op Assessment: Report given to RN and Post -op Vital signs reviewed and stable  Post vital signs: Reviewed and stable  Last Vitals:  Vitals Value Taken Time  BP    Temp    Pulse    Resp    SpO2      Last Pain:  Vitals:   10/15/22 0826  TempSrc:   PainSc: 0-No pain      Patients Stated Pain Goal: 9 (50/56/97 9480)  Complications: No notable events documented.

## 2022-10-16 DIAGNOSIS — E785 Hyperlipidemia, unspecified: Secondary | ICD-10-CM | POA: Insufficient documentation

## 2022-10-16 LAB — SURGICAL PATHOLOGY

## 2022-10-16 NOTE — Assessment & Plan Note (Addendum)
Lipid panel ordered in September and shows total cholesterol 247, LDL 131.  Atorvastatin 20 mg daily was started as a result. -Repeat lipid panel ordered today -He has requested a referral to nutrition to discuss appropriate dieting practices

## 2022-10-16 NOTE — Assessment & Plan Note (Signed)
Currently prescribed Protonix 40 mg twice daily for treatment of refractory GERD.  He has previously been referred to GI and will undergo EGD for further evaluation next week (12/12).

## 2022-10-16 NOTE — Assessment & Plan Note (Signed)
Mood is stable currently.  He was previously referred to psychiatry for management of BPD 2, PTSD, and anxiety/depression.  He has establish care and was seen on 11/2.  He currently has follow-up scheduled for early January.

## 2022-10-16 NOTE — Assessment & Plan Note (Addendum)
S/p left rotator cuff repair following complete, traumatic tear. Progressing with PT. He has follow up with orthopedic surgery scheduled for early January.

## 2022-10-22 ENCOUNTER — Encounter (HOSPITAL_COMMUNITY): Payer: Self-pay | Admitting: Internal Medicine

## 2022-10-22 ENCOUNTER — Other Ambulatory Visit: Payer: Self-pay | Admitting: Psychiatry

## 2022-10-22 ENCOUNTER — Other Ambulatory Visit: Payer: Self-pay

## 2022-10-22 DIAGNOSIS — F411 Generalized anxiety disorder: Secondary | ICD-10-CM

## 2022-10-22 DIAGNOSIS — F5104 Psychophysiologic insomnia: Secondary | ICD-10-CM

## 2022-10-22 MED ORDER — GABAPENTIN 100 MG PO CAPS: 200.0000 mg | ORAL_CAPSULE | Freq: Two times a day (BID) | ORAL | 0 refills | Status: DC | PRN

## 2022-11-01 ENCOUNTER — Telehealth: Payer: Self-pay | Admitting: Orthopedic Surgery

## 2022-11-01 NOTE — Telephone Encounter (Signed)
I sent via release in epic what was requested by new nurse case manager.

## 2022-11-01 NOTE — Telephone Encounter (Signed)
Dorrene German is the new nurse case manager. She called to see when was his last appt and the next appt. Below is her information   Dorrene German Phone Number [phone] Fax Number [phone] Email [email or web address]

## 2022-11-05 ENCOUNTER — Encounter: Payer: Self-pay | Admitting: Orthopedic Surgery

## 2022-11-05 ENCOUNTER — Ambulatory Visit (INDEPENDENT_AMBULATORY_CARE_PROVIDER_SITE_OTHER): Payer: Worker's Compensation | Admitting: Orthopedic Surgery

## 2022-11-05 VITALS — Ht 69.0 in | Wt 205.0 lb

## 2022-11-05 DIAGNOSIS — M67814 Other specified disorders of tendon, left shoulder: Secondary | ICD-10-CM

## 2022-11-05 NOTE — Progress Notes (Signed)
Orthopaedic Postop Note  Assessment: Andrew Kovaleski Sr. is a 47 y.o. male s/p left shoulder arthroscopy, subacromial decompression and biceps tenodesis  DOS: 08/05/2022  Plan: Andrew Gillespie is doing well.  His strength, range of motion and function continues to get better.  Anticipate full recovery at 5-6 months.  This was discussed with the patient.  He is in agreement.  I will see him back in 3 months, but if he is progressing to the point that he thinks he can return to work before then, he should return to clinic.  Follow-up: Return in about 3 months (around 02/04/2023). XR at next visit: none  Subjective:  Chief Complaint  Patient presents with   Routine Gillespie Op    Lt shoulderDOS 08/05/22    History of Present Illness: Andrew Zukowski Sr. is a 47 y.o. male who presents following the above stated procedure.  Surgery was 3 months ago.  He continues to improve.  He is doing exercises on his own.  He notes slight restriction in internal rotation, and some difficulty with repetitive overhead motion.  Otherwise, he is happy with his progress.  Occasional medications, but nothing on a consistent basis.  He feels as though he is getting stronger.   Review of Systems: No fevers or chills No numbness or tingling No Chest Pain No shortness of breath   Objective: Ht '5\' 9"'$  (1.753 m)   Wt 205 lb (93 kg)   BMI 30.27 kg/m   Physical Exam:  Alert and oriented.  No acute distress  Left shoulder surgical incisions have healed.  No surrounding erythema or drainage.  Forward flexion 160 degrees.  Internal rotation to the lumbar spine.  4/5 strength in the deltoid, and supraspinatus.  No tenderness palpation over the anterior shoulder.   IMAGING: I personally ordered and reviewed the following images:  No new imaging obtained today.  Mordecai Rasmussen, MD 11/05/2022 10:42 AM

## 2022-11-06 ENCOUNTER — Telehealth (HOSPITAL_COMMUNITY): Payer: BC Managed Care – PPO | Admitting: Psychiatry

## 2022-11-08 ENCOUNTER — Encounter: Payer: Self-pay | Admitting: Radiology

## 2022-11-08 ENCOUNTER — Telehealth: Payer: Self-pay | Admitting: Radiology

## 2022-11-08 DIAGNOSIS — Z09 Encounter for follow-up examination after completed treatment for conditions other than malignant neoplasm: Secondary | ICD-10-CM

## 2022-11-08 DIAGNOSIS — M67814 Other specified disorders of tendon, left shoulder: Secondary | ICD-10-CM

## 2022-11-08 DIAGNOSIS — S46012D Strain of muscle(s) and tendon(s) of the rotator cuff of left shoulder, subsequent encounter: Secondary | ICD-10-CM

## 2022-11-08 NOTE — Telephone Encounter (Signed)
Rhonda called, asked for last OV note and OOW status.  I have sent that.    She also needs an order to con't PT.  Please fax to her?  Thanks.

## 2022-11-13 ENCOUNTER — Telehealth (INDEPENDENT_AMBULATORY_CARE_PROVIDER_SITE_OTHER): Payer: BC Managed Care – PPO | Admitting: Psychiatry

## 2022-11-13 ENCOUNTER — Encounter (HOSPITAL_COMMUNITY): Payer: Self-pay | Admitting: Psychiatry

## 2022-11-13 DIAGNOSIS — F411 Generalized anxiety disorder: Secondary | ICD-10-CM | POA: Diagnosis not present

## 2022-11-13 DIAGNOSIS — F331 Major depressive disorder, recurrent, moderate: Secondary | ICD-10-CM | POA: Diagnosis not present

## 2022-11-13 DIAGNOSIS — F4310 Post-traumatic stress disorder, unspecified: Secondary | ICD-10-CM | POA: Diagnosis not present

## 2022-11-13 DIAGNOSIS — F5104 Psychophysiologic insomnia: Secondary | ICD-10-CM

## 2022-11-13 DIAGNOSIS — F410 Panic disorder [episodic paroxysmal anxiety] without agoraphobia: Secondary | ICD-10-CM

## 2022-11-13 MED ORDER — GABAPENTIN 300 MG PO CAPS: 300.0000 mg | ORAL_CAPSULE | Freq: Two times a day (BID) | ORAL | 1 refills | Status: DC | PRN

## 2022-11-13 MED ORDER — LAMOTRIGINE 150 MG PO TABS: 150.0000 mg | ORAL_TABLET | Freq: Every day | ORAL | 2 refills | Status: DC

## 2022-11-13 NOTE — Patient Instructions (Signed)
We increased the gabapentin to 300 mg twice daily as needed for panic attacks and insomnia.  You can wait to finish the current 100 mg dose of Lamictal pills before starting the 150 mg pills.  Keep up the good work with cutting back on her smoking and eating more meals per day.  I hope the job interview goes well.
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
## 2022-11-14 NOTE — Addendum Note (Signed)
Addended by: Elizabeth Sauer on: 11/14/2022 09:38 AM   Modules accepted: Orders

## 2022-11-18 ENCOUNTER — Other Ambulatory Visit: Payer: Self-pay

## 2022-11-18 DIAGNOSIS — Z83719 Family history of colon polyps, unspecified: Secondary | ICD-10-CM

## 2022-11-18 DIAGNOSIS — R748 Abnormal levels of other serum enzymes: Secondary | ICD-10-CM

## 2022-11-18 DIAGNOSIS — R1319 Other dysphagia: Secondary | ICD-10-CM

## 2022-11-18 DIAGNOSIS — K219 Gastro-esophageal reflux disease without esophagitis: Secondary | ICD-10-CM

## 2022-11-26 ENCOUNTER — Encounter (HOSPITAL_COMMUNITY): Payer: Self-pay

## 2022-11-26 ENCOUNTER — Ambulatory Visit (HOSPITAL_COMMUNITY): Payer: BC Managed Care – PPO | Admitting: Clinical

## 2022-11-28 ENCOUNTER — Encounter (HOSPITAL_COMMUNITY): Payer: Self-pay

## 2022-11-29 DIAGNOSIS — Z83719 Family history of colon polyps, unspecified: Secondary | ICD-10-CM | POA: Diagnosis not present

## 2022-11-29 DIAGNOSIS — K219 Gastro-esophageal reflux disease without esophagitis: Secondary | ICD-10-CM | POA: Diagnosis not present

## 2022-11-29 DIAGNOSIS — R1319 Other dysphagia: Secondary | ICD-10-CM | POA: Diagnosis not present

## 2022-11-29 DIAGNOSIS — R748 Abnormal levels of other serum enzymes: Secondary | ICD-10-CM | POA: Diagnosis not present

## 2022-12-02 ENCOUNTER — Other Ambulatory Visit: Payer: Self-pay

## 2022-12-02 DIAGNOSIS — R748 Abnormal levels of other serum enzymes: Secondary | ICD-10-CM

## 2022-12-03 ENCOUNTER — Encounter: Payer: Self-pay | Admitting: Nutrition

## 2022-12-03 ENCOUNTER — Encounter: Payer: BC Managed Care – PPO | Attending: Internal Medicine | Admitting: Nutrition

## 2022-12-03 VITALS — Ht 69.0 in | Wt 208.8 lb

## 2022-12-03 DIAGNOSIS — E782 Mixed hyperlipidemia: Secondary | ICD-10-CM

## 2022-12-03 DIAGNOSIS — R748 Abnormal levels of other serum enzymes: Secondary | ICD-10-CM

## 2022-12-03 NOTE — Progress Notes (Signed)
Medical Nutrition Therapy  Appointment Start time:  [phone]  Appointment End time:  1015  Primary concerns today: Hyperlipidemia , Obesity Referral diagnosis: E78.2, E66.9 Preferred learning style: NO preference  Learning readiness: Ready    NUTRITION ASSESSMENT  47 yr old wmale here for hyperlipidemia and to work on eating healthier. He was referred by Dr. Doren Custard. He sees a psychiatrist .PMH: Bipolar, depression, GERD, Hyperlipidemia and ADD. He reports eating 1-2 times per day, sometimes not eating at all. Is under a lot of stress with multiple family members living with he and his wife. He does all the cooking and shopping in the home. He is out on medical leave right now due to shoulder surgery. Diet has been fairly processed and he drinks a lot of sodas.  Smokes a pack per day of cigarrettes. Has been in recover for 19 years from alcohol. He admits he drinks a lot of soda and eats processed junk food.  He is willing to work with lifestyle medicine to improve his overall health, reduce his TG and weight and improve his emotional health.  Anthropometrics  Wt Readings from Last 3 Encounters:  12/03/22 208 lb 12.8 oz (94.7 kg)  11/05/22 205 lb (93 kg)  10/15/22 205 lb (93 kg)   Ht Readings from Last 3 Encounters:  12/03/22 '5\' 9"'$  (1.753 m)  11/05/22 '5\' 9"'$  (1.753 m)  10/11/22 '5\' 9"'$  (1.753 m)   Body mass index is 30.83 kg/m. '@BMIFA'$ @ Facility age limit for growth %iles is 20 years. Facility age limit for growth %iles is 20 years.    Clinical Medical Hx: See chart Medications: see chart Labs: Lipid Panel     Component Value Date/Time   CHOL 168 10/11/2022 0924   TRIG 432 (H) 10/11/2022 0924   HDL 41 10/11/2022 0924   CHOLHDL 4.1 10/11/2022 0924   CHOLHDL 4.1 02/16/2013 1521   VLDL 44 (H) 02/16/2013 1521   LDLCALC 61 10/11/2022 0924   LABVLDL 66 (H) 10/11/2022 0924    Notable Signs/Symptoms: None  Lifestyle & Dietary Hx Married and lives with wife and children and  grandkids and he does cooking and shopping. Currently out of work due to shoulder surgery. Smokes a pack a day; down from 1-1/2 packs per day. Drinks a lot of sodas and sweets. Recovery from alcohol.  Estimated daily fluid intake: 16 oz of water; rest soda Supplements: None Sleep: 4-6hrs Stress / self-care: Blended family livign under one roof Current average weekly physical activity: ADL  24-Hr Dietary Recall First Meal: skip Juice or water to take medications Snack:  Second Meal: 1230;  Frozen pizza, Cherry coke  Snack:  Third Meal: Spaghetti and garlic bread,  Coke or MT Dew Snack:  Beverages: soda, some water  Estimated Energy Needs Calories: 1800  Carbohydrate: 200g Protein: 135g Fat: 50g   NUTRITION DIAGNOSIS  NB-1.1 Food and nutrition-related knowledge deficit As related to Hyperlipidemia.and weight  As evidenced by TG > 400, and BMI 30.   NUTRITION INTERVENTION  Nutrition education (E-1) on the following topics:  Heart Healthy Diet Sleep health Recovery nutrition   Lifestyle Medicine  - Whole Food, Plant Predominant Nutrition is highly recommended: Eat Plenty of vegetables, Mushrooms, fruits, Legumes, Whole Grains, Nuts, seeds in lieu of processed meats, processed snacks/pastries red meat, poultry, eggs.    -It is better to avoid simple carbohydrates including: Cakes, Sweet Desserts, Ice Cream, Soda (diet and regular), Sweet Tea, Candies, Chips, Cookies, Store Bought Juices, Alcohol in Excess of  1-2 drinks  a day, Lemonade,  Artificial Sweeteners, Doughnuts, Coffee Creamers, "Sugar-free" Products, etc, etc.  This is not a complete list.....  Exercise: If you are able: 30 -60 minutes a day ,4 days a week, or 150 minutes a week.  The longer the better.  Combine stretch, strength, and aerobic activities.  If you were told in the past that you have high risk for cardiovascular diseases, you may seek evaluation by your heart doctor prior to initiating moderate to intense  exercise programs.   Handouts Provided Include  Lifestyle Medicine  Learning Style & Readiness for Change Teaching method utilized: Visual & Auditory  Demonstrated degree of understanding via: Teach Back  Barriers to learning/adherence to lifestyle change: financial limits  Goals Established by Pt Get TG less than 150 mg/dl in next 6 months. Lose 10 lbs in the next 6 months Drink a gallon of water per day Cut down on sodas to 1-2 per day Exercise 1 hr a day. Increase whole food plant based food choices.   MONITORING & EVALUATION Dietary intake, weekly physical activity, and weight in 1 month.  Next Steps  Patient is to work on meal planning and cutting out processes sugar.Marland Kitchen

## 2022-12-03 NOTE — Patient Instructions (Signed)
Goals Established by Pt Get TG less than 150 mg/dl in next 6 months. Lose 10 lbs in the next 6 months Drink a gallon of water per day Cut down on sodas to 1-2 per day Exercise 1 hr a day. Increase whole food plant based food choices.

## 2022-12-04 ENCOUNTER — Telehealth (INDEPENDENT_AMBULATORY_CARE_PROVIDER_SITE_OTHER): Payer: BC Managed Care – PPO | Admitting: Psychiatry

## 2022-12-04 ENCOUNTER — Encounter (HOSPITAL_COMMUNITY): Payer: Self-pay | Admitting: Psychiatry

## 2022-12-04 DIAGNOSIS — Z9151 Personal history of suicidal behavior: Secondary | ICD-10-CM

## 2022-12-04 DIAGNOSIS — F4310 Post-traumatic stress disorder, unspecified: Secondary | ICD-10-CM

## 2022-12-04 DIAGNOSIS — F5104 Psychophysiologic insomnia: Secondary | ICD-10-CM | POA: Diagnosis not present

## 2022-12-04 DIAGNOSIS — F17210 Nicotine dependence, cigarettes, uncomplicated: Secondary | ICD-10-CM

## 2022-12-04 DIAGNOSIS — F411 Generalized anxiety disorder: Secondary | ICD-10-CM | POA: Diagnosis not present

## 2022-12-04 DIAGNOSIS — F410 Panic disorder [episodic paroxysmal anxiety] without agoraphobia: Secondary | ICD-10-CM

## 2022-12-04 DIAGNOSIS — F331 Major depressive disorder, recurrent, moderate: Secondary | ICD-10-CM

## 2022-12-04 DIAGNOSIS — Z720 Tobacco use: Secondary | ICD-10-CM

## 2022-12-04 MED ORDER — QUETIAPINE FUMARATE 25 MG PO TABS: ORAL_TABLET | ORAL | 2 refills | Status: DC

## 2022-12-04 NOTE — Patient Instructions (Signed)
We added Seroquel (quetiapine) to your regimen today, start taking this at 25 mg nightly for the next week and check in by MyChart but we will plan on increasing to 50 mg nightly if you are tolerating it well.  We otherwise kept the gabapentin and Lamictal the same.  Please call your doctor to coordinate scheduling a EKG, lipid panel, A1c as these are helpful monitoring labs for being on antipsychotic class of medication like Seroquel.
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
## 2022-12-05 LAB — HEPATIC FUNCTION PANEL
AG Ratio: 1.9 (calc) (ref 1.0–2.5)
ALT: 37 U/L (ref 9–46)
AST: 18 U/L (ref 10–40)
Albumin: 4.5 g/dL (ref 3.6–5.1)
Alkaline phosphatase (APISO): 115 U/L (ref 36–130)
Bilirubin, Direct: 0.1 mg/dL (ref 0.0–0.2)
Globulin: 2.4 g/dL (calc) (ref 1.9–3.7)
Indirect Bilirubin: 0.3 mg/dL (calc) (ref 0.2–1.2)
Total Bilirubin: 0.4 mg/dL (ref 0.2–1.2)
Total Protein: 6.9 g/dL (ref 6.1–8.1)

## 2022-12-05 LAB — TEST AUTHORIZATION

## 2022-12-05 LAB — GAMMA GT: GGT: 55 U/L (ref 3–95)

## 2022-12-05 LAB — MITOCHONDRIAL ANTIBODIES: Mitochondrial M2 Ab, IgG: <=20 U (ref <=20.0)

## 2022-12-16 ENCOUNTER — Telehealth (HOSPITAL_COMMUNITY): Payer: BC Managed Care – PPO | Admitting: Psychiatry

## 2022-12-18 ENCOUNTER — Encounter (HOSPITAL_COMMUNITY): Payer: Self-pay | Admitting: Psychiatry

## 2022-12-18 ENCOUNTER — Telehealth (INDEPENDENT_AMBULATORY_CARE_PROVIDER_SITE_OTHER): Payer: BC Managed Care – PPO | Admitting: Psychiatry

## 2022-12-18 DIAGNOSIS — F4310 Post-traumatic stress disorder, unspecified: Secondary | ICD-10-CM | POA: Diagnosis not present

## 2022-12-18 DIAGNOSIS — Z79899 Other long term (current) drug therapy: Secondary | ICD-10-CM | POA: Insufficient documentation

## 2022-12-18 DIAGNOSIS — F411 Generalized anxiety disorder: Secondary | ICD-10-CM

## 2022-12-18 DIAGNOSIS — F5104 Psychophysiologic insomnia: Secondary | ICD-10-CM | POA: Diagnosis not present

## 2022-12-18 DIAGNOSIS — Z720 Tobacco use: Secondary | ICD-10-CM

## 2022-12-18 DIAGNOSIS — F17210 Nicotine dependence, cigarettes, uncomplicated: Secondary | ICD-10-CM

## 2022-12-18 DIAGNOSIS — F410 Panic disorder [episodic paroxysmal anxiety] without agoraphobia: Secondary | ICD-10-CM

## 2022-12-18 DIAGNOSIS — F331 Major depressive disorder, recurrent, moderate: Secondary | ICD-10-CM | POA: Diagnosis not present

## 2022-12-18 MED ORDER — QUETIAPINE FUMARATE 50 MG PO TABS: 50.0000 mg | ORAL_TABLET | Freq: Every day | ORAL | 1 refills | Status: DC

## 2022-12-18 MED ORDER — LAMOTRIGINE 200 MG PO TABS: 200.0000 mg | ORAL_TABLET | Freq: Every day | ORAL | 2 refills | Status: DC

## 2022-12-18 NOTE — Patient Instructions (Addendum)
We increased the lamictal to 282m nightly today, this will hopefully further improve your depressed mood. We kept the seroquel at 561mbut changed the formulation to be a 5025mablet so you don't have to take two moving forward. Please call your PCP's office to schedule an ecg, lipid panel, and A1c for monitoring while you are on seroquel.
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
## 2022-12-24 ENCOUNTER — Other Ambulatory Visit (HOSPITAL_COMMUNITY): Payer: Self-pay | Admitting: Psychiatry

## 2022-12-24 DIAGNOSIS — F410 Panic disorder [episodic paroxysmal anxiety] without agoraphobia: Secondary | ICD-10-CM

## 2022-12-24 DIAGNOSIS — F5104 Psychophysiologic insomnia: Secondary | ICD-10-CM

## 2022-12-24 DIAGNOSIS — F331 Major depressive disorder, recurrent, moderate: Secondary | ICD-10-CM

## 2023-01-02 ENCOUNTER — Encounter: Payer: Self-pay | Admitting: Radiology

## 2023-01-08 ENCOUNTER — Ambulatory Visit: Payer: BC Managed Care – PPO | Admitting: Nutrition

## 2023-01-09 ENCOUNTER — Other Ambulatory Visit: Payer: Self-pay | Admitting: Psychiatry

## 2023-01-09 DIAGNOSIS — F410 Panic disorder [episodic paroxysmal anxiety] without agoraphobia: Secondary | ICD-10-CM

## 2023-01-09 DIAGNOSIS — F5104 Psychophysiologic insomnia: Secondary | ICD-10-CM

## 2023-01-10 NOTE — Telephone Encounter (Signed)
Called pt to schedule no answer, left vm

## 2023-01-13 NOTE — Progress Notes (Unsigned)
GI Office Note    Referring Provider: Johnette Abraham, MD Primary Care Physician:  Johnette Abraham, MD Primary Gastroenterologist: Elon Alas. Abbey Chatters, DO  Date:  01/14/2023  ID:  Andrew Mode Sr., DOB 02/18/1976, MRN [id number]   Chief Complaint   Chief Complaint  Patient presents with   Follow-up    Patient here today for a follow up visit. Patient says he has egd and tcs on 10/15/2022. He has been seen here in the past due to elevated alkaline phosphatase and gerd. Patient says gerd is doing better,he takes protonix 40 mg bid,but occasionally still has some gerd symptoms.    History of Present Illness  Andrew Whitesell Sr. is a 47 y.o. male with a history of HLD, mini stroke in 2018, GERD presenting today for follow-up post procedures.  In the office 07/31/2019 evaluation of GERD and schedule colonoscopy.  Patient reported longstanding history of acid reflux and solid food dysphagia dating back to his 32s.  Predominantly has dysphagia related to meats.  Having frequent heartburn.  He had OTC omeprazole and esomeprazole as well as H2 blockers without significant improvement.  Took Zantac for years.  Does not recall.  At times GERD aggravated by water.  Father with history of colon polyps.  Endoscopy scheduled for colonoscopy after being cleared from shoulder surgery.  Mildly elevated alk phos recommend repeat in 4 months fasting.  Scheduled for EGD with possible dilation for evaluation of dysphagia.  Colonoscopy 10/15/2022: -3 mm polyp in the transverse colon -Three 3-4 mm polyps in the sigmoid colon -Exam otherwise normal -Transverse polyp pathology tubular normal, sigmoid polyp hyperplastic -Recommend repeat in 5 years for surveillance.  EGD 10/15/2022: -2 cm hiatal hernia -Schatzki's ring s/p dilation -Gastritis s/p biopsy -Normal duodenum -Gastric biopsies with no significant histopathologic changes and negative for H. Pylori -Pantoprazole 40 mg twice daily  Labs  11/29/2022: HFP with T. bili 0.4, direct 0.1, indirect 0.3, alk phos 116, AST 18, ALT 37.  ANA and GGT normal.  Today: GERD - Still having  a few reflux symptoms about twice per week. Not everyday like it was. Taking PPI BID. Having breakthrough symptoms in the evenings and does not matter what he eats. Has not taken anything  for breakthrough. Has used off brand nexium previously. I snot eating late at night. Tries not to eat past 10pm and lays down between 11pm and 1am. No N/V, dysphagia. No abdominal pain, early satiety, lack of appetite.  No lower GI symptoms - No melena, brbpr, abdominal pain.   Dysphagia is significantly improved. Is able to eat steak again.    Current Outpatient Medications  Medication Sig Dispense Refill   acetaminophen (TYLENOL) 500 MG tablet Take 1,000 mg by mouth every 6 (six) hours as needed for moderate pain.     aspirin EC 81 MG tablet Take 81 mg by mouth 2 (two) times a week. Swallow whole.     atorvastatin (LIPITOR) 20 MG tablet Take 1 tablet (20 mg total) by mouth daily. 90 tablet 3   gabapentin (NEURONTIN) 300 MG capsule Take 1 capsule (300 mg total) by mouth 2 (two) times daily as needed (anxiety, insomnia). 60 capsule 1   ibuprofen (ADVIL) 200 MG tablet Take 400 mg by mouth every 6 (six) hours as needed for moderate pain.     lamoTRIgine (LAMICTAL) 200 MG tablet Take 1 tablet (200 mg total) by mouth daily. 30 tablet 2   pantoprazole (PROTONIX) 40 MG tablet Take 1 tablet (  40 mg total) by mouth 2 (two) times daily before a meal. 180 tablet 3   QUEtiapine (SEROQUEL) 50 MG tablet Take 1 tablet (50 mg total) by mouth at bedtime. 30 tablet 1   No current facility-administered medications for this visit.    Past Medical History:  Diagnosis Date   Hyperlipidemia    Hypoglycemia    PONV (postoperative nausea and vomiting)    Stroke [facility])    mini stroke 2008    Past Surgical History:  Procedure Laterality Date   APPENDECTOMY     BALLOON DILATION N/A  10/15/2022   Procedure: BALLOON DILATION;  Surgeon: Eloise Harman, DO;  Location: [facility];  Service: Endoscopy;  Laterality: N/A;   BIOPSY  10/15/2022   Procedure: BIOPSY;  Surgeon: Eloise Harman, DO;  Location: [facility];  Service: Endoscopy;;   COLONOSCOPY WITH PROPOFOL N/A 10/15/2022   Procedure: COLONOSCOPY WITH PROPOFOL;  Surgeon: Eloise Harman, DO;  Location: [facility];  Service: Endoscopy;  Laterality: N/A;  9:00 am   ESOPHAGOGASTRODUODENOSCOPY (EGD) WITH PROPOFOL N/A 10/15/2022   Procedure: ESOPHAGOGASTRODUODENOSCOPY (EGD) WITH PROPOFOL;  Surgeon: Eloise Harman, DO;  [facility]: [facility];  Service: Endoscopy;  Laterality: N/A;   HERNIA REPAIR     POLYPECTOMY  10/15/2022   Procedure: POLYPECTOMY;  Surgeon: Eloise Harman, DO;  Location: [facility];  Service: Endoscopy;;   SHOULDER ARTHROSCOPY WITH ROTATOR CUFF REPAIR Left 08/05/2022   Procedure: SHOULDER ARTHROSCOPY WITH DEBRIDEMENT AND BICEPS TENODESIS;  Surgeon: Mordecai Rasmussen, MD;  Location: [facility];  Service: Orthopedics;  Laterality: Left;    Family History  Problem Relation Age of Onset   Diabetes Mother    Mental illness Mother    Diabetes Father    Heart attack Father    Mental illness Father    Colon polyps Father        frequent colonoscopies, every 2 years   Diabetes Sister    Mental illness Brother    Diabetes Maternal Grandmother    Stroke Maternal Grandmother    Diabetes Maternal Grandfather    Heart attack Maternal Grandfather    Congestive Heart Failure Maternal Grandfather    Heart attack Paternal Grandmother    Heart attack Paternal Grandfather    Colon cancer Neg Hx     Allergies as of 01/14/2023 - Review Complete 01/14/2023  Allergen Reaction Noted   Penicillins Anaphylaxis and Hives 08/17/2011   Dimetapp [albertsons di bromm] Other (See Comments) 08/17/2011   Promethazine hcl Other (See Comments) 08/17/2011   Wellbutrin [bupropion] Anxiety 09/26/2016     Social History   Socioeconomic History   Marital status: Married    Spouse name: Not on file   Number of children: Not on file   Years of education: Not on file   Highest education level: Not on file  Occupational History   Not on file  Tobacco Use   Smoking status: Every Day    Packs/day: 0.75    Types: Cigarettes    Start date: 11/04/1988   Smokeless tobacco: Former    Quit date: 11/13/2012  Vaping Use   Vaping Use: Never used  Substance and Sexual Activity   Alcohol use: Not Currently    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 Denies fatigue, weakness, weight loss.  CV: Denies chest pain, palpitations, syncope, peripheral edema, and claudication. Resp: Denies dyspnea at rest, cough, wheezing, coughing up blood, and pleurisy. GI: See HPI Derm: Denies rash, itching, dry skin Psych: Denies depression, anxiety, memory loss, confusion. No homicidal or suicidal ideation.  Heme: Denies bruising, bleeding, and enlarged lymph nodes.   Physical Exam   BP 119/82 (BP Location: Left Arm, Patient Position: Sitting, Cuff Size: Large)   Pulse (!) 108   Temp 97.8 F (36.6 C) (Temporal)   Ht '5\' 8"'$  (1.727 m)   Wt 215 lb 12.8 oz (97.9 kg)   BMI 32.81 kg/m   General:   Alert and oriented. No distress noted. Pleasant and cooperative.  Head:  Normocephalic and atraumatic. Eyes:  Conjuctiva clear without scleral icterus. Lungs:  Clear to auscultation bilaterally. No wheezes, rales, or rhonchi. No distress.  Heart:  S1, S2 present without murmurs appreciated.  Abdomen:  +BS, soft,  non-tender and non-distended. No rebound or guarding. No HSM or masses noted. Rectal: deferred Msk:  Symmetrical without gross deformities. Normal posture. Extremities:  Without edema. Neurologic:  Alert and  oriented x4 Psych:  Alert and cooperative. Normal mood and affect.   Assessment  Andrew Sawaya Sr. is a 47 y.o. male with a history of HLD, mini stroke in 2018, GERD presenting today for follow-up post procedures.  History of colon polyps: Also with family history of colon polyps in his father.  Colonoscopy December 2023 with 4 polyps removed, one found to be tubular adenoma.  Due for surveillance December 2028.  Currently without any alarm symptoms and no lower GI symptoms.  GERD, dysphagia: Chronic acid reflux. EGD December 2023 with gastritis, biopsies negative for H. pylori.  He did have evidence of Schatzki's ring that was dilated.  Was advised to begin pantoprazole 40 mg twice daily.  He has had significant improvement of symptoms but still currently having breakthrough symptoms about twice per week and it does not matter what he eats.  He tries not to eat past 10 PM but usually does not go to bed until between 11 PM and 1 AM.  GERD diet lifestyle modifications reinforced.  Advised to continue PPI twice daily and advised that he may use famotidine 20 mg at night 30 minutes prior to bed as needed.  Advised Tums as needed for immediate relief.  Elevated alk phos: History of mildly elevated alk phos in September 2023.  Repeat labs in January 2024 with completely normal HFP.  GGT and AMA negative.  Currently without any nausea, vomiting, or abdominal pain.   PLAN   Continue pantoprazole 40 mg twice daily. GERD diet and lifestyle modifications Famotidine 20 mg once nightly as needed. Tums for immediate relief if needed Surveillance colonoscopy in 2028 Follow-up in 6 months, sooner if needed   Venetia Night, MSN, FNP-BC, AGACNP-BC [facility]

## 2023-01-14 ENCOUNTER — Encounter: Payer: Self-pay | Admitting: Gastroenterology

## 2023-01-14 ENCOUNTER — Telehealth: Payer: Self-pay | Admitting: Gastroenterology

## 2023-01-14 ENCOUNTER — Other Ambulatory Visit (HOSPITAL_COMMUNITY): Payer: Self-pay | Admitting: Psychiatry

## 2023-01-14 ENCOUNTER — Ambulatory Visit (INDEPENDENT_AMBULATORY_CARE_PROVIDER_SITE_OTHER): Payer: BC Managed Care – PPO | Admitting: Gastroenterology

## 2023-01-14 VITALS — BP 119/82 | HR 108 | Temp 97.8°F | Ht 68.0 in | Wt 215.8 lb

## 2023-01-14 DIAGNOSIS — K219 Gastro-esophageal reflux disease without esophagitis: Secondary | ICD-10-CM

## 2023-01-14 DIAGNOSIS — Z86010 Personal history of colonic polyps: Secondary | ICD-10-CM

## 2023-01-14 DIAGNOSIS — R1319 Other dysphagia: Secondary | ICD-10-CM

## 2023-01-14 DIAGNOSIS — F411 Generalized anxiety disorder: Secondary | ICD-10-CM

## 2023-01-14 DIAGNOSIS — R748 Abnormal levels of other serum enzymes: Secondary | ICD-10-CM

## 2023-01-14 DIAGNOSIS — F5104 Psychophysiologic insomnia: Secondary | ICD-10-CM

## 2023-01-14 MED ORDER — GABAPENTIN 300 MG PO CAPS: 300.0000 mg | ORAL_CAPSULE | Freq: Two times a day (BID) | ORAL | 1 refills | Status: DC | PRN

## 2023-01-14 NOTE — Telephone Encounter (Signed)
Patient was advised about insurance coming up inactive - said spouse has been having issues with insurance

## 2023-01-14 NOTE — Telephone Encounter (Signed)
Advised patient earlier that his insurance came up as being inactive-stated his wife had been having issues with her insurance-spouse is subscriber-received a phone call from Arbie Cookey with [employer] stating the insurance is active-if there are further questions plse contact ph# [phone]

## 2023-01-14 NOTE — Telephone Encounter (Signed)
Called pt no answer left vm 

## 2023-01-14 NOTE — Patient Instructions (Addendum)
Continue pantoprazole 40 mg twice daily.  Continue to follow a GERD diet:  Avoid fried, fatty, greasy, spicy, citrus foods. Avoid caffeine and carbonated beverages. Avoid chocolate. Try eating 4-6 small meals a day rather than 3 large meals. Do not eat within 3 hours of laying down. Prop head of bed up on wood or bricks to create a 6 inch incline.   For any breakthrough symptoms you may take famotidine 20 mg once nightly as needed about 30 minutes prior to bed.  For immediate relief you may take Tums as needed.  Please reach out if you have any worsening symptoms or symptoms become more frequent.  We will plan to follow-up in 6 months, sooner if needed.  It was a pleasure to see you today. I want to create trusting relationships with patients. If you receive a survey regarding your visit,  I greatly appreciate you taking time to fill this out on paper or through your MyChart. I value your feedback.  Venetia Night, MSN, FNP-BC, AGACNP-BC [facility]

## 2023-01-14 NOTE — Telephone Encounter (Signed)
Pt called back appt scheduled tomorrow 01/15/23.

## 2023-01-15 ENCOUNTER — Encounter (HOSPITAL_COMMUNITY): Payer: Self-pay | Admitting: Psychiatry

## 2023-01-15 ENCOUNTER — Telehealth (INDEPENDENT_AMBULATORY_CARE_PROVIDER_SITE_OTHER): Payer: BC Managed Care – PPO | Admitting: Psychiatry

## 2023-01-15 DIAGNOSIS — Z720 Tobacco use: Secondary | ICD-10-CM

## 2023-01-15 DIAGNOSIS — Z9151 Personal history of suicidal behavior: Secondary | ICD-10-CM

## 2023-01-15 DIAGNOSIS — F4310 Post-traumatic stress disorder, unspecified: Secondary | ICD-10-CM | POA: Diagnosis not present

## 2023-01-15 DIAGNOSIS — F411 Generalized anxiety disorder: Secondary | ICD-10-CM

## 2023-01-15 DIAGNOSIS — F5104 Psychophysiologic insomnia: Secondary | ICD-10-CM | POA: Diagnosis not present

## 2023-01-15 DIAGNOSIS — F410 Panic disorder [episodic paroxysmal anxiety] without agoraphobia: Secondary | ICD-10-CM

## 2023-01-15 DIAGNOSIS — Z79899 Other long term (current) drug therapy: Secondary | ICD-10-CM

## 2023-01-15 DIAGNOSIS — F3181 Bipolar II disorder: Secondary | ICD-10-CM

## 2023-01-15 MED ORDER — QUETIAPINE FUMARATE 50 MG PO TABS: 50.0000 mg | ORAL_TABLET | Freq: Every day | ORAL | 2 refills | Status: DC

## 2023-01-15 NOTE — Patient Instructions (Signed)
We did not make any medication changes today.  Keep practicing the 5 senses technique that we went over today that should help with anxiety and help ground you when you are noticing an urge to binge eat.  Please go by your doctor's office to get the EKG, lipid panel, A1c that we need for monitoring while you are on Seroquel.
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
## 2023-02-04 ENCOUNTER — Encounter: Payer: Self-pay | Admitting: Orthopedic Surgery

## 2023-02-04 ENCOUNTER — Other Ambulatory Visit (INDEPENDENT_AMBULATORY_CARE_PROVIDER_SITE_OTHER): Payer: Worker's Compensation

## 2023-02-04 ENCOUNTER — Ambulatory Visit (INDEPENDENT_AMBULATORY_CARE_PROVIDER_SITE_OTHER): Payer: Worker's Compensation | Admitting: Orthopedic Surgery

## 2023-02-04 DIAGNOSIS — M5410 Radiculopathy, site unspecified: Secondary | ICD-10-CM

## 2023-02-04 DIAGNOSIS — G8929 Other chronic pain: Secondary | ICD-10-CM | POA: Diagnosis not present

## 2023-02-04 DIAGNOSIS — M25512 Pain in left shoulder: Secondary | ICD-10-CM | POA: Diagnosis not present

## 2023-02-04 MED ORDER — PREDNISONE 10 MG (21) PO TBPK: ORAL_TABLET | ORAL | 0 refills | Status: DC

## 2023-02-04 NOTE — Progress Notes (Signed)
Orthopaedic Postop Note  Assessment: Andrew Andrew Gillespie Sr. is a 47 y.o. male s/p left shoulder arthroscopy, subacromial decompression and biceps tenodesis  Left-sided neck pain, with burning pain into his left hand and long finger.  DOS: 08/05/2022  Plan: Mr. Andrew Andrew Gillespie is recovering very slowly.  He is now complaining of burning pain through his left shoulder, into the left hand.  He also has some pain in the front of the shoulder, which limits his lifting.  He has stopped working with physical therapy.  Unclear what is causing his persistent pain in the shoulder, which is limiting his ability to lift anything heavier than a cup of coffee.  At this point, I am recommending a functional capacity evaluation by Occupational Therapy.  Based on the findings, we can reevaluate his current work restrictions.  As for the pain he is experiencing in the left hand, this is more consistent with nerve irritation.  I am recommending a short course of prednisone.  We can discuss this further when he returns to clinic.  Nothing concerning on x-rays obtained in clinic today.  Follow-up: Return for After evaluation by PT/OT. XR at next visit: none  Subjective:  Chief Complaint  Patient presents with   Routine Andrew Gillespie Op    Lt shoulder DOS 08/05/22    History of Present Illness: Andrew Andrew Gillespie Sr. is a 47 y.o. male who presents following the above stated procedure.  Surgery was 6 months ago.  He continues to have some issues in his left shoulder.  He notes some pain in the anterior aspect of the left shoulder.  He states his pain gets worse if he carries a gallon of milk.  His pain gets worse when he is doing the dishes.  He also has pain in the front of the shoulder when he is trying to full laundry.  No specific injury recently.  He has been working with physical therapy, but has been discharged from therapy.  He is not doing exercises on his own.  He is not reporting that he is having a burning sensation through  his left shoulder and into the left hand, specifically to the long finger.  This is been ongoing for several months.  No specific injury.  No recent falls.  He is taking gabapentin.  Otherwise, he is taking Tylenol or ibuprofen as needed.  Review of Systems: No fevers or chills No numbness or tingling No Chest Pain No shortness of breath   Objective: There were no vitals taken for this visit.  Physical Exam:  Alert and oriented.  No acute distress  Left shoulder surgical incisions have healed.  No surrounding erythema or drainage.  Forward flexion 170 degrees.  Internal rotation to the lumbar spine.  4+/5 strength in the deltoid, and supraspinatus.  Mild tenderness palpation over the anterior shoulder.  Pain in the back of the shoulder with cross body adduction.  Normal neck range of motion.   IMAGING: I personally ordered and reviewed the following images:  Cervical spine x-rays were obtained in clinic today.  Maintenance of the natural curvature.  No acute injuries are noted.  No evidence of anterolisthesis.  Mild diffuse degenerative changes, without obvious loss of disc height or osteophyte formation.  No bony lesions.  Impression: Cervical spine x-rays with mild degenerative changes.  Andrew Rasmussen, MD 02/04/2023 9:58 AM

## 2023-02-10 ENCOUNTER — Telehealth: Payer: Self-pay | Admitting: Orthopedic Surgery

## 2023-02-10 NOTE — Telephone Encounter (Signed)
Dr. Dallas Schimke pt - Adonis Brook Nurse Case Manager [phone] lvm stating that Dr. Dallas Schimke ordered a FCE and [employer] was in the process of scheduling and stated that she received an email asking if they want a job description sent for the FCE or a general evaluation of what the injured worker can do. Please call the [facility].

## 2023-02-12 NOTE — Telephone Encounter (Signed)
Spoke with Andrew Gillespie, advised, she verbally stated she understood

## 2023-02-14 ENCOUNTER — Telehealth: Payer: Self-pay | Admitting: Orthopedic Surgery

## 2023-02-14 NOTE — Telephone Encounter (Signed)
Adonis Brook, nurse case manager [phone] lvm stating the patient is having his FCE on 02/25/23 and would like to get him scheduled an appointment for a follow post the FCE.  I have lvm for the patient to call and schedule and once he has, I will advise Bjorn Loser of the appointment day/time.

## 2023-02-19 ENCOUNTER — Telehealth (HOSPITAL_COMMUNITY): Payer: BC Managed Care – PPO | Admitting: Psychiatry

## 2023-02-20 ENCOUNTER — Telehealth (INDEPENDENT_AMBULATORY_CARE_PROVIDER_SITE_OTHER): Payer: BC Managed Care – PPO | Admitting: Psychiatry

## 2023-02-20 ENCOUNTER — Encounter (HOSPITAL_COMMUNITY): Payer: Self-pay | Admitting: Psychiatry

## 2023-02-20 DIAGNOSIS — F3181 Bipolar II disorder: Secondary | ICD-10-CM

## 2023-02-20 DIAGNOSIS — F5104 Psychophysiologic insomnia: Secondary | ICD-10-CM

## 2023-02-20 DIAGNOSIS — Z79899 Other long term (current) drug therapy: Secondary | ICD-10-CM | POA: Diagnosis not present

## 2023-02-20 DIAGNOSIS — F4310 Post-traumatic stress disorder, unspecified: Secondary | ICD-10-CM

## 2023-02-20 DIAGNOSIS — Z720 Tobacco use: Secondary | ICD-10-CM

## 2023-02-20 MED ORDER — LAMOTRIGINE 25 MG PO TABS: 50.0000 mg | ORAL_TABLET | Freq: Every day | ORAL | 1 refills | Status: DC

## 2023-02-20 NOTE — Patient Instructions (Signed)
We increased the Lamictal to include a 50 mg dose in the morning which will be 2 to 25 mg tablets.  Be sure to keep this separate from the 200 mg dose that you will still be taking at night.  He will probably find the most benefit from not being on prednisone any longer as this can exacerbate bipolar disorder.  Do contact her primary care office to find a time when you can get your lipid panel, A1c, and EKG updated.
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
## 2023-03-07 ENCOUNTER — Ambulatory Visit: Payer: Worker's Compensation | Admitting: Orthopedic Surgery

## 2023-03-07 ENCOUNTER — Telehealth: Payer: Self-pay | Admitting: Orthopedic Surgery

## 2023-03-07 ENCOUNTER — Encounter: Payer: Self-pay | Admitting: Orthopedic Surgery

## 2023-03-07 DIAGNOSIS — G8929 Other chronic pain: Secondary | ICD-10-CM | POA: Diagnosis not present

## 2023-03-07 DIAGNOSIS — M25512 Pain in left shoulder: Secondary | ICD-10-CM

## 2023-03-07 DIAGNOSIS — Z09 Encounter for follow-up examination after completed treatment for conditions other than malignant neoplasm: Secondary | ICD-10-CM

## 2023-03-07 NOTE — Progress Notes (Signed)
Orthopaedic Postop Note  Assessment: Andrew Kekahuna Sr. is a 47 y.o. male s/p left shoulder arthroscopy, subacromial decompression and biceps tenodesis  Left-sided neck pain, with burning pain into his left hand and long finger.  DOS: 08/05/2022  Plan: Andrew Gillespie continues to have some restrictions in his recovery.  He has completed his FCE and they have noted some restrictions in his range of motion, as well as strength, especially with repeated motions.  He continues to have pain in the anterior and lateral aspect of the left shoulder.  FCE lists him within a light work to moderate level work, with the limits in his lifting to nothing greater than 20 pounds.  At this point, I would like to assess the shoulder again.  I am recommending an MRI with contrast to see if there is something else going on in the shoulder.  Based on the results of the MRI, we could also proceed with some targeted injections.  Follow-up: No follow-ups on file. XR at next visit: none  Subjective:  Chief Complaint  Patient presents with   Results    FCE results    History of Present Illness: Andrew Sancen Sr. is a 47 y.o. male who presents following the above stated procedure.  Surgery was more than 6 months ago.  He continues to have pain in the anterior aspect of the left shoulder, with inability to complete repetitive tasks.  He has obtained an FCE, and is here to discuss the results.  The pain he was experiencing in the neck, with some radiating pains into his hand has completely resolved.  He feels as though he just upper arm.   Review of Systems: No fevers or chills No numbness or tingling No Chest Pain No shortness of breath   Objective: There were no vitals taken for this visit.  Physical Exam:  Alert and oriented.  No acute distress  Left shoulder surgical incisions have healed.  No surrounding erythema or drainage.  Forward flexion 170 degrees.  Internal rotation to the lumbar spine.   External rotation to 60 degrees.  4+/5 strength in the deltoid, and supraspinatus.  Mild tenderness palpation over the anterior shoulder.  He notes a pulling sensation with O'Brien's testing.     IMAGING: I personally ordered and reviewed the following images:  No new images were obtained today.  FCE results are available, and recorded in the media tab.   Oliver Barre, MD 03/07/2023 11:02 AM

## 2023-03-07 NOTE — Telephone Encounter (Signed)
Dr. Dallas Schimke pt - Adonis Brook nurse case manager w/[employer] [phone] lvm requesting the patient's office note, work note and MRI order to be faxed to [phone].

## 2023-03-07 NOTE — Patient Instructions (Signed)
Please provide a note for work.  Can return to work, with no lifting greater than 20 pounds.

## 2023-03-08 ENCOUNTER — Encounter (HOSPITAL_COMMUNITY): Payer: Self-pay

## 2023-03-24 ENCOUNTER — Other Ambulatory Visit (HOSPITAL_COMMUNITY): Payer: Self-pay | Admitting: Psychiatry

## 2023-03-24 DIAGNOSIS — F410 Panic disorder [episodic paroxysmal anxiety] without agoraphobia: Secondary | ICD-10-CM

## 2023-03-24 DIAGNOSIS — F4310 Post-traumatic stress disorder, unspecified: Secondary | ICD-10-CM

## 2023-03-24 DIAGNOSIS — F5104 Psychophysiologic insomnia: Secondary | ICD-10-CM

## 2023-03-24 DIAGNOSIS — F3181 Bipolar II disorder: Secondary | ICD-10-CM

## 2023-03-24 MED ORDER — GABAPENTIN 300 MG PO CAPS: 300.0000 mg | ORAL_CAPSULE | Freq: Two times a day (BID) | ORAL | 1 refills | Status: DC | PRN

## 2023-03-24 NOTE — Telephone Encounter (Signed)
From: Tempie Donning Glorioso Sr. To: Office of Elsie Lincoln, MD Sent: 03/21/2023 3:42 PM EDT Subject: Medication Renewal Request  Refills have been requested for the following medications:   gabapentin (NEURONTIN) 300 MG capsule [Franklin Baumbach A Mysha Peeler]  Preferred pharmacy: [id number] - [place], [place] - [address] AT [facility] Delivery method: [employer]

## 2023-03-25 ENCOUNTER — Other Ambulatory Visit: Payer: Self-pay

## 2023-03-25 DIAGNOSIS — G8929 Other chronic pain: Secondary | ICD-10-CM

## 2023-03-25 DIAGNOSIS — Z09 Encounter for follow-up examination after completed treatment for conditions other than malignant neoplasm: Secondary | ICD-10-CM

## 2023-03-26 ENCOUNTER — Ambulatory Visit: Payer: BC Managed Care – PPO | Admitting: Orthopedic Surgery

## 2023-03-26 ENCOUNTER — Other Ambulatory Visit (HOSPITAL_COMMUNITY): Payer: Self-pay | Admitting: Psychiatry

## 2023-03-26 DIAGNOSIS — F331 Major depressive disorder, recurrent, moderate: Secondary | ICD-10-CM

## 2023-03-26 DIAGNOSIS — F4310 Post-traumatic stress disorder, unspecified: Secondary | ICD-10-CM

## 2023-03-27 ENCOUNTER — Telehealth: Payer: Self-pay | Admitting: Orthopedic Surgery

## 2023-03-27 NOTE — Telephone Encounter (Signed)
Dr. Dallas Schimke pt Jae Dire w/[facility] [phone] (she spoke so fast and had such a strong accent, I couldn't make it out no matter how many times I listened to the message) lvm stating she needs the order for this pt's MRI as soon as possible. Fax # [phone]

## 2023-03-27 NOTE — Telephone Encounter (Signed)
Needed provider NPI for procedure.

## 2023-03-27 NOTE — Telephone Encounter (Signed)
Please assist

## 2023-03-27 NOTE — Telephone Encounter (Signed)
Dr. Dallas Schimke pt - Arlene w/[facility] [phone] lvm stating that they need clarification on the MRI order, she needs a call back as soon as possible.

## 2023-04-08 ENCOUNTER — Ambulatory Visit: Payer: BC Managed Care – PPO | Admitting: Orthopedic Surgery

## 2023-04-15 ENCOUNTER — Encounter: Payer: Self-pay | Admitting: Orthopedic Surgery

## 2023-04-15 ENCOUNTER — Ambulatory Visit (INDEPENDENT_AMBULATORY_CARE_PROVIDER_SITE_OTHER): Payer: Worker's Compensation | Admitting: Orthopedic Surgery

## 2023-04-15 DIAGNOSIS — M67814 Other specified disorders of tendon, left shoulder: Secondary | ICD-10-CM

## 2023-04-15 NOTE — Patient Instructions (Addendum)
The results of your MRI do not demonstrate new pathology.  There is some irritation of the tendons, with fraying of the tendons.  There is no concern for a tear at this time.  The prior surgery demonstrates that the biceps tendon is in a good position.   We discussed 3 options in clinic today.  Please let me know if you would like to consider any of these options.    You discussed fatigue with some activities.  This could be a product of increased activity at work.  As the strength and endurance improves, you may find that the shoulder starts to feel better. We can consider different types of injections around the shoulder, including intra-articular injections.  This can be done at any time.  We can use an ultrasound in the office to target all different parts of the shoulder in hopes of improving her symptoms. If your shoulder continues to bother you, and you feel as though there is something else that needs to be addressed, we can also consider obtaining a second opinion.  This will be coordinated through [employer].

## 2023-04-15 NOTE — Progress Notes (Signed)
Orthopaedic Postop Note  Assessment: Andrew Antonelli Sr. is a 47 y.o. male s/p left shoulder arthroscopy, subacromial decompression and biceps tenodesis  DOS: 08/05/2022  Plan: Mr. Glosser has not fully recovered.  The work that he is doing currently is leading to fatigue in his arms.  We reviewed the MRI findings in clinic today, which is without an acute injury.  He does have some mild irritation of tendons, and some fraying, without full-thickness tearing or other pathology.  The fatigue he is experiencing may be related to his overall decreased activity.  Anticipate that this would improve.  He does not appear to be complaining of pain in clinic today, as he states he does not take any medicines for his left shoulder.  Based on the findings of the MRI, we could also proceed with some ultrasound-guided injections.  Finally, if he is unhappy with his current results, and would like to seek a second opinion, we can help set this up.  All of these options were discussed in detail in clinic today, and I have written these options out for him to review with his wife.  He states his understanding.  He will contact the clinic to update [place] and let us know how he wishes to proceed.  Currently, he will follow-up as needed.  Follow-up: Return if symptoms worsen or fail to improve. XR at next visit: none  Subjective:  Chief Complaint  Patient presents with   Shoulder Pain    L shoulder doing about the same. On light duty at work.    History of Present Illness: Andrew Vandyne Sr. is a 48 y.o. male who presents following the above stated procedure.  Surgery was approximately 8 months ago.  He has occasional pains in the anterior aspect of his left shoulder.  He has returned to light duty at work, and reports that he has fatigue in bilateral upper extremities when he gets home.  No new injuries.  He is not taking any medications for pain.  Pain is primarily in the anterior aspect of his shoulder.  No  numbness or tingling currently.  He has obtained an MRI, and is here to discuss the findings.  Review of Systems: No fevers or chills No numbness or tingling No Chest Pain No shortness of breath   Objective: There were no vitals taken for this visit.  Physical Exam:  Alert and oriented.  No acute distress  Left shoulder surgical incisions have healed.  No surrounding erythema or drainage.  Forward flexion 170 degrees.  Internal rotation to the lumbar spine.  External rotation to 60 degrees.  4+/5 strength in the deltoid, and supraspinatus.  Mild tenderness to palpation over the anterior shoulder.  Mild atrophy of the LUE   IMAGING: I personally ordered and reviewed the following images:   MRI of the left shoulder was available in clinic today.  Impression:  Mild distal supraspinatus and moderate distal infraspinatus tendinosis with bursal sided fraying.  No high-grade or retracted cuff tear.  No significant muscle atrophy.  Prior biceps tenodesis, which appears intact.  Mild glenohumeral and AC joint osteoarthritis.   Andrew Barre, MD 04/15/2023 1:15 PM

## 2023-04-18 ENCOUNTER — Ambulatory Visit (INDEPENDENT_AMBULATORY_CARE_PROVIDER_SITE_OTHER): Payer: BC Managed Care – PPO | Admitting: Internal Medicine

## 2023-04-18 ENCOUNTER — Encounter: Payer: Self-pay | Admitting: Internal Medicine

## 2023-04-18 VITALS — BP 119/73 | HR 93 | Ht 68.0 in | Wt 208.2 lb

## 2023-04-18 DIAGNOSIS — K219 Gastro-esophageal reflux disease without esophagitis: Secondary | ICD-10-CM | POA: Diagnosis not present

## 2023-04-18 DIAGNOSIS — E782 Mixed hyperlipidemia: Secondary | ICD-10-CM | POA: Diagnosis not present

## 2023-04-18 DIAGNOSIS — F3181 Bipolar II disorder: Secondary | ICD-10-CM | POA: Diagnosis not present

## 2023-04-18 DIAGNOSIS — Z720 Tobacco use: Secondary | ICD-10-CM

## 2023-04-18 DIAGNOSIS — E781 Pure hyperglyceridemia: Secondary | ICD-10-CM | POA: Diagnosis not present

## 2023-04-18 NOTE — Assessment & Plan Note (Signed)
Symptoms remain well-controlled with Protonix 40 mg twice daily.  Underwent EGD in December, which was remarkable for Schatzki's ring (dilated) and a 2 cm hiatal hernia.

## 2023-04-18 NOTE — Progress Notes (Signed)
Established Patient Office Visit  Subjective   Patient ID: Andrew Stauss Sr., male    DOB: [birth year]  Age: 47 y.o. MRN: [id number]  Chief Complaint  Patient presents with   Hyperlipidemia    Follow up   Andrew Gillespie returns to care today for routine follow-up.  Andrew Gillespie was last evaluated by me on 10/11/22.  No medication changes were made at that time.  In the interim Andrew Gillespie has undergone EGD/colonoscopy, both of which were largely unremarkable.  Andrew Gillespie has also been seen by orthopedic surgery and psychiatry for follow-up on multiple occasions.  There have otherwise been no acute interval events. Andrew Gillespie reports feeling well today.  Andrew Gillespie is asymptomatic and has no acute concerns to discuss.  Past Medical History:  Diagnosis Date   Hyperlipidemia    Hypoglycemia    PONV (postoperative nausea and vomiting)    Stroke [facility])    mini stroke 2008   Past Surgical History:  Procedure Laterality Date   APPENDECTOMY     BALLOON DILATION N/A 10/15/2022   Procedure: BALLOON DILATION;  Surgeon: Lanelle Bal, DO;  Location: [facility];  Service: Endoscopy;  Laterality: N/A;   BIOPSY  10/15/2022   Procedure: BIOPSY;  Surgeon: Lanelle Bal, DO;  Location: [facility];  Service: Endoscopy;;   COLONOSCOPY WITH PROPOFOL N/A 10/15/2022   Procedure: COLONOSCOPY WITH PROPOFOL;  Surgeon: Lanelle Bal, DO;  Location: [facility];  Service: Endoscopy;  Laterality: N/A;  9:00 am   ESOPHAGOGASTRODUODENOSCOPY (EGD) WITH PROPOFOL N/A 10/15/2022   Procedure: ESOPHAGOGASTRODUODENOSCOPY (EGD) WITH PROPOFOL;  Surgeon: Lanelle Bal, DO;  [facility];  Service: Endoscopy;  Laterality: N/A;   HERNIA REPAIR     POLYPECTOMY  10/15/2022   Procedure: POLYPECTOMY;  Surgeon: Lanelle Bal, DO;  [facility]: [facility];  Service: Endoscopy;;   SHOULDER ARTHROSCOPY WITH ROTATOR CUFF REPAIR Left 08/05/2022   Procedure: SHOULDER ARTHROSCOPY WITH DEBRIDEMENT AND BICEPS TENODESIS;  Surgeon: Oliver Barre, MD;  Location: [facility];  Service: Orthopedics;  Laterality: Left;   Social History   Tobacco Use   Smoking status: Every Day    Packs/day: .5    Types: Cigarettes    Start date: 11/04/1988   Smokeless tobacco: Former    Quit date: 11/13/2012  Vaping Use   Vaping Use: Never used  Substance Use Topics   Alcohol use: Not Currently    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   Drug use: No   Family History  Problem Relation Age of Onset   Diabetes Mother    Mental illness Mother    Diabetes Father    Heart attack Father    Mental illness Father    Colon polyps Father        frequent colonoscopies, every 2 years   Diabetes Sister    Mental illness Brother    Diabetes Maternal Grandmother    Stroke Maternal Grandmother    Diabetes Maternal Grandfather    Heart attack Maternal Grandfather    Congestive Heart Failure Maternal Grandfather    Heart attack Paternal Grandmother    Heart attack Paternal Grandfather    Colon cancer Neg Hx    Allergies  Allergen Reactions   Penicillins Anaphylaxis and Hives    Has patient had a PCN reaction causing immediate rash, facial/tongue/throat swelling, SOB or lightheadedness with hypotension: yes Has patient had a PCN reaction causing severe rash involving mucus membranes or skin necrosis: yes Has  patient had a PCN reaction that required hospitalization: unknown Has patient had a PCN reaction occurring within the last 10 years: No If all of the above answers are "NO", then may proceed with Cephalosporin use.    Dimetapp [[employer]ber[employer]ons Di Bromm] Other (See Comments)    Child hood allergy   Promethazine Hcl Other (See Comments)    paranoid   Wellbutrin [Bupropion] Anxiety      Review of Systems  Constitutional:  Negative for chills and fever.  HENT:  Negative for sore throat.   Respiratory:  Negative for cough and shortness of breath.   Cardiovascular:  Negative for chest pain, palpitations and leg swelling.  Gastrointestinal:  Negative  for abdominal pain, blood in stool, constipation, diarrhea, nausea and vomiting.  Genitourinary:  Negative for dysuria and hematuria.  Musculoskeletal:  Negative for myalgias.  Skin:  Negative for itching and rash.  Neurological:  Negative for dizziness and headaches.  Psychiatric/Behavioral:  Negative for depression and suicidal ideas.      Objective:     BP 119/73   Pulse 93   Ht 5\' 8"  (1.727 m)   Wt 208 lb 3.2 oz (94.4 kg)   SpO2 90%   BMI 31.66 kg/m  BP Readings from Last 3 Encounters:  04/18/23 119/73  01/14/23 119/82  10/15/22 94/67   Physical Exam Vitals reviewed.  Constitutional:      General: Andrew Gillespie is not in acute distress.    Appearance: Normal appearance. Andrew Gillespie is obese. Andrew Gillespie is not ill-appearing.  HENT:     Head: Normocephalic and atraumatic.     Right Ear: External ear normal.     Left Ear: External ear normal.     Nose: Nose normal. No congestion or rhinorrhea.     Mouth/Throat:     Mouth: Mucous membranes are moist.     Pharynx: Oropharynx is clear.  Eyes:     General: No scleral icterus.    Extraocular Movements: Extraocular movements intact.     Conjunctiva/sclera: Conjunctivae normal.     Pupils: Pupils are equal, round, and reactive to light.  Cardiovascular:     Rate and Rhythm: Normal rate and regular rhythm.     Pulses: Normal pulses.     Heart sounds: Normal heart sounds. No murmur heard. Pulmonary:     Effort: Pulmonary effort is normal.     Breath sounds: Normal breath sounds. No wheezing, rhonchi or rales.  Abdominal:     General: Abdomen is flat. Bowel sounds are normal. There is no distension.     Palpations: Abdomen is soft.     Tenderness: There is no abdominal tenderness.  Musculoskeletal:        General: No swelling or deformity. Normal range of motion.     Cervical back: Normal range of motion.  Skin:    General: Skin is warm and dry.     Capillary Refill: Capillary refill takes less than 2 seconds.  Neurological:     General: No  focal deficit present.     Mental Status: Andrew Gillespie is alert and oriented to person, place, and time.     Motor: No weakness.  Psychiatric:        Mood and Affect: Mood normal.        Behavior: Behavior normal.        Thought Content: Thought content normal.   Last CBC Lab Results  Component Value Date   WBC 7.6 08/01/2022   HGB 14.7 08/01/2022   HCT 43.2 08/01/2022  MCV 92.3 08/01/2022   MCH 31.4 08/01/2022   RDW 12.5 08/01/2022   PLT 235 08/01/2022   Last metabolic panel Lab Results  Component Value Date   GLUCOSE 104 (H) 10/11/2022   NA 142 10/11/2022   K 4.4 10/11/2022   CL 105 10/11/2022   CO2 18 (L) 10/11/2022   BUN 8 10/11/2022   CREATININE 0.90 10/11/2022   EGFR 107 10/11/2022   CALCIUM 9.4 10/11/2022   PROT 6.9 11/29/2022   ALBUMIN 4.6 10/11/2022   LABGLOB 2.3 10/11/2022   AGRATIO 2.0 10/11/2022   BILITOT 0.4 11/29/2022   ALKPHOS 139 (H) 10/11/2022   AST 18 11/29/2022   ALT 37 11/29/2022   ANIONGAP 8 08/01/2022   Last lipids Lab Results  Component Value Date   CHOL 168 10/11/2022   HDL 41 10/11/2022   LDLCALC 61 10/11/2022   TRIG 432 (H) 10/11/2022   CHOLHDL 4.1 10/11/2022   Last hemoglobin A1c Lab Results  Component Value Date   HGBA1C 5.3 07/12/2022   Last thyroid functions Lab Results  Component Value Date   TSH 2.150 08/03/2015   Last vitamin B12 and Folate Lab Results  Component Value Date   VITAMINB12 514 07/12/2022   FOLATE 9.6 07/12/2022     Assessment & Plan:   Problem List Items Addressed This Visit       Esophageal reflux - Primary    Symptoms remain well-controlled with Protonix 40 mg twice daily.  Underwent EGD in December, which was remarkable for Schatzki's ring (dilated) and a 2 cm hiatal hernia.      Bipolar 2 disorder, major depressive episode (HCC) (Chronic)    Followed by psychiatry (Dr. Adrian Blackwater).  Currently prescribed Lamictal, Seroquel, and gabapentin.  Mood remains stable today.      Tobacco use    Currently  smoking 0.5 packs/day of cigarettes.  Reports smoking for the last 24 years.  Andrew Gillespie is gradually working toward cessation. -The patient was counseled on the dangers of tobacco use, and was advised to quit.  Reviewed strategies to maximize success, including removing cigarettes and smoking materials from environment, stress management, substitution of other forms of reinforcement, support of family/friends, and written materials.       Hyperlipidemia    Lipid panel updated in December.  Total cholesterol 168 and LDL 61.  Andrew Gillespie is currently prescribed atorvastatin 20 mg daily.        Hypertriglyceridemia    Moderate hypertriglyceridemia noted on labs from December.  I have recommended that Andrew Gillespie start fish oil supplementation.  We will repeat lipid panel at follow-up in 3 months.       Return in about 3 months (around 07/19/2023) for CPE.    Billie Lade, MD

## 2023-04-18 NOTE — Assessment & Plan Note (Signed)
Followed by psychiatry (Dr. Adrian Blackwater).  Currently prescribed Lamictal, Seroquel, and gabapentin.  Mood remains stable today.

## 2023-04-18 NOTE — Assessment & Plan Note (Signed)
Moderate hypertriglyceridemia noted on labs from December.  I have recommended that he start fish oil supplementation.  We will repeat lipid panel at follow-up in 3 months.

## 2023-04-18 NOTE — Assessment & Plan Note (Signed)
Lipid panel updated in December.  Total cholesterol 168 and LDL 61.  He is currently prescribed atorvastatin 20 mg daily.

## 2023-04-18 NOTE — Patient Instructions (Signed)
It was a pleasure to see you today.  Thank you for giving [place] the opportunity to be involved in your care.  Below is a brief recap of your visit and next steps.  We will plan to see you again in 3 months.  Summary I recommend adding fish oil supplementation for high triglycerides Follow up in 3 months for annual exam

## 2023-04-18 NOTE — Assessment & Plan Note (Signed)
Currently smoking 0.5 packs/day of cigarettes.  Reports smoking for the last 24 years.  He is gradually working toward cessation. -The patient was counseled on the dangers of tobacco use, and was advised to quit.  Reviewed strategies to maximize success, including removing cigarettes and smoking materials from environment, stress management, substitution of other forms of reinforcement, support of family/friends, and written materials.

## 2023-04-25 ENCOUNTER — Other Ambulatory Visit (HOSPITAL_COMMUNITY): Payer: Self-pay | Admitting: Psychiatry

## 2023-04-25 DIAGNOSIS — F3181 Bipolar II disorder: Secondary | ICD-10-CM

## 2023-04-25 DIAGNOSIS — F4310 Post-traumatic stress disorder, unspecified: Secondary | ICD-10-CM

## 2023-05-01 ENCOUNTER — Other Ambulatory Visit (HOSPITAL_COMMUNITY): Payer: Self-pay | Admitting: Psychiatry

## 2023-05-01 ENCOUNTER — Encounter (HOSPITAL_COMMUNITY): Payer: Self-pay | Admitting: Psychiatry

## 2023-05-01 ENCOUNTER — Telehealth (INDEPENDENT_AMBULATORY_CARE_PROVIDER_SITE_OTHER): Payer: BC Managed Care – PPO | Admitting: Psychiatry

## 2023-05-01 DIAGNOSIS — F410 Panic disorder [episodic paroxysmal anxiety] without agoraphobia: Secondary | ICD-10-CM

## 2023-05-01 DIAGNOSIS — F3181 Bipolar II disorder: Secondary | ICD-10-CM

## 2023-05-01 DIAGNOSIS — Z79899 Other long term (current) drug therapy: Secondary | ICD-10-CM | POA: Diagnosis not present

## 2023-05-01 DIAGNOSIS — F17200 Nicotine dependence, unspecified, uncomplicated: Secondary | ICD-10-CM

## 2023-05-01 DIAGNOSIS — F4310 Post-traumatic stress disorder, unspecified: Secondary | ICD-10-CM

## 2023-05-01 DIAGNOSIS — F411 Generalized anxiety disorder: Secondary | ICD-10-CM

## 2023-05-01 DIAGNOSIS — Z720 Tobacco use: Secondary | ICD-10-CM

## 2023-05-01 DIAGNOSIS — F5104 Psychophysiologic insomnia: Secondary | ICD-10-CM

## 2023-05-01 MED ORDER — LAMOTRIGINE 25 MG PO TABS: 50.0000 mg | ORAL_TABLET | Freq: Every day | ORAL | 2 refills | Status: DC

## 2023-05-01 MED ORDER — QUETIAPINE FUMARATE 100 MG PO TABS: 100.0000 mg | ORAL_TABLET | Freq: Every day | ORAL | 2 refills | Status: DC

## 2023-05-01 MED ORDER — LAMOTRIGINE 200 MG PO TABS: 200.0000 mg | ORAL_TABLET | Freq: Every evening | ORAL | 2 refills | Status: DC
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
## 2023-05-02 ENCOUNTER — Ambulatory Visit: Payer: Self-pay | Admitting: Orthopedic Surgery

## 2023-05-07 ENCOUNTER — Ambulatory Visit: Payer: BC Managed Care – PPO | Admitting: Orthopedic Surgery

## 2023-05-13 ENCOUNTER — Encounter (HOSPITAL_COMMUNITY): Payer: Self-pay

## 2023-05-14 ENCOUNTER — Other Ambulatory Visit (HOSPITAL_COMMUNITY): Payer: Self-pay | Admitting: Psychiatry

## 2023-05-14 DIAGNOSIS — F3181 Bipolar II disorder: Secondary | ICD-10-CM

## 2023-05-14 DIAGNOSIS — F4310 Post-traumatic stress disorder, unspecified: Secondary | ICD-10-CM

## 2023-05-14 MED ORDER — LAMOTRIGINE 100 MG PO TABS: 100.0000 mg | ORAL_TABLET | Freq: Every day | ORAL | 2 refills | Status: DC

## 2023-05-14 NOTE — Progress Notes (Signed)
Patient finding benefit from titration of seroquel without side effect. For further management of mood swings with predominant depression, will titrate morning dose of lamictal to 100mg  daily while maintaining 200mg  nightly dose of lamictal.

## 2023-05-20 ENCOUNTER — Ambulatory Visit (INDEPENDENT_AMBULATORY_CARE_PROVIDER_SITE_OTHER): Payer: BC Managed Care – PPO | Admitting: Orthopedic Surgery

## 2023-05-20 ENCOUNTER — Encounter: Payer: Self-pay | Admitting: Orthopedic Surgery

## 2023-05-20 VITALS — BP 119/81 | HR 76 | Ht 68.0 in | Wt 208.0 lb

## 2023-05-20 DIAGNOSIS — G8929 Other chronic pain: Secondary | ICD-10-CM | POA: Diagnosis not present

## 2023-05-20 DIAGNOSIS — M25512 Pain in left shoulder: Secondary | ICD-10-CM | POA: Diagnosis not present

## 2023-05-20 DIAGNOSIS — M67814 Other specified disorders of tendon, left shoulder: Secondary | ICD-10-CM | POA: Diagnosis not present

## 2023-05-20 NOTE — Progress Notes (Signed)
Orthopaedic Postop Note  Assessment: Andrew Capraro Sr. is a 47 y.o. male s/p left shoulder arthroscopy, subacromial decompression and biceps tenodesis  DOS: 08/05/2022  Plan: Andrew Gillespie is continuing to get better.  However, he is not back to 100%.  At the last visit we discussed proceeding with the treatment options including injections.  He return to clinic today interested in ultrasound-guided injections.  These were completed in clinic today.  Because he is getting stronger, we can increase his lifting to 20 pounds.  I will see him back in 2 months.  Documentation was provided.  If he needs anything else, he will contact clinic.  Procedure note injection - Left shoulder, ultrasound guidance   Verbal consent was obtained to inject the Left shoulder, glenohumeral joint  Timeout was completed to confirm the site of injection.   Using the ultrasound, the rotator cuff tendons were identified.  The joint space was also identified. The skin was prepped with alcohol and ethyl chloride was sprayed at the injection site.  A 21-gauge needle was used to inject 40 mg of Depo-Medrol and 1% lidocaine (4 cc) into the glenohumeral joint space of the Left shoulder using a posterolateral approach.  The needle was visualized entering the glenohumeral joint, and the medication was also visualized. There were no complications.  A sterile bandage was applied.   Note: In order to accurately identify the placement of the needle, ultrasound was required, to increase the accuracy, and specificity of the injection.   Procedure note injection - Left proximal biceps tendon sheath, ultrasound guidance   Verbal consent was obtained to inject the Left shoulder, biceps tendon sheath Timeout was completed to confirm the site of injection.   Using the ultrasound, the long head of the biceps was identified.   The skin was prepped with alcohol and ethyl chloride was sprayed at the injection site.  A 21-gauge needle was  used to inject 40 mg of Depo-Medrol and 1% lidocaine (1 cc) into the tendon sheath around the long head of the biceps of the Left shoulder using a direct anterior approach.  The needle was visualized entering the tendon sheath, and the injections was also visualized. There were no complications.  A sterile bandage was applied.   Note: In order to accurately identify the placement of the needle, ultrasound was required, to increase the accuracy, and specificity of the injection.      Follow-up: Return in about 2 months (around 07/21/2023). XR at next visit: none  Subjective:  Chief Complaint  Patient presents with   Injections    Left shoulder /US     History of Present Illness: Andrew Durfee Sr. is a 47 y.o. male who presents following the above stated procedure.  Surgery was approximately 9 months ago.  He continues to improve, albeit slowly.  Occasional pain in the anterior aspect the shoulder.  Occasional pain in the lateral aspect of the left shoulder.  He has continued with light work duties, limited to 10 pounds of lifting.  He thinks he can do more.  He is interested in injections.     Review of Systems: No fevers or chills No numbness or tingling No Chest Pain No shortness of breath   Objective: BP 119/81   Pulse 76   Ht 5\' 8"  (1.727 m)   Wt 208 lb (94.3 kg)   BMI 31.63 kg/m   Physical Exam:  Alert and oriented.  No acute distress  Left shoulder surgical incisions have healed.  No surrounding erythema or drainage.  Forward flexion 170 degrees.  Abduction at his side to 140 degrees.  Internal rotation to the lumbar spine.  External rotation to 60 degrees.  4+/5 strength in the deltoid, and supraspinatus.  Mild tenderness to palpation over the anterior shoulder.  Mild atrophy of the LUE   IMAGING: I personally ordered and reviewed the following images:   MRI of the left shoulder was available in clinic today.  Impression:  Mild distal supraspinatus and  moderate distal infraspinatus tendinosis with bursal sided fraying.  No high-grade or retracted cuff tear.  No significant muscle atrophy.  Prior biceps tenodesis, which appears intact.  Mild glenohumeral and AC joint osteoarthritis.   Oliver Barre, MD 05/20/2023 2:02 PM

## 2023-05-20 NOTE — Patient Instructions (Addendum)
Please provide a note for work; limit lifting to nothing more than 20 lbs   Instructions Following Joint Injections  In clinic today, you received an injection in one of your joints (sometimes more than one).  Occasionally, you can have some pain at the injection site, this is normal.  You can place ice at the injection site, or take over-the-counter medications such as Tylenol (acetaminophen) or Advil (ibuprofen).  Please follow all directions listed on the bottle.  If your joint (knee or shoulder) becomes swollen, red or very painful, please contact the clinic for additional assistance.   Two medications were injected, including lidocaine and a steroid (often referred to as cortisone).  Lidocaine is effective almost immediately but wears off quickly.  However, the steroid can take a few days to improve your symptoms.  In some cases, it can make your pain worse for a couple of days.  Do not be concerned if this happens as it is common.  You can apply ice or take some over-the-counter medications as needed.

## 2023-05-23 ENCOUNTER — Other Ambulatory Visit (HOSPITAL_COMMUNITY): Payer: Self-pay | Admitting: Psychiatry

## 2023-05-23 DIAGNOSIS — F5104 Psychophysiologic insomnia: Secondary | ICD-10-CM

## 2023-05-23 DIAGNOSIS — F3181 Bipolar II disorder: Secondary | ICD-10-CM

## 2023-05-23 DIAGNOSIS — F410 Panic disorder [episodic paroxysmal anxiety] without agoraphobia: Secondary | ICD-10-CM

## 2023-05-23 DIAGNOSIS — F4310 Post-traumatic stress disorder, unspecified: Secondary | ICD-10-CM

## 2023-05-28 ENCOUNTER — Other Ambulatory Visit: Payer: Self-pay | Admitting: Gastroenterology

## 2023-06-02 ENCOUNTER — Telehealth (INDEPENDENT_AMBULATORY_CARE_PROVIDER_SITE_OTHER): Payer: BC Managed Care – PPO | Admitting: Psychiatry

## 2023-06-02 ENCOUNTER — Encounter (HOSPITAL_COMMUNITY): Payer: Self-pay | Admitting: Psychiatry

## 2023-06-02 DIAGNOSIS — Z720 Tobacco use: Secondary | ICD-10-CM

## 2023-06-02 DIAGNOSIS — F410 Panic disorder [episodic paroxysmal anxiety] without agoraphobia: Secondary | ICD-10-CM

## 2023-06-02 DIAGNOSIS — Z79899 Other long term (current) drug therapy: Secondary | ICD-10-CM

## 2023-06-02 DIAGNOSIS — G4700 Insomnia, unspecified: Secondary | ICD-10-CM

## 2023-06-02 DIAGNOSIS — F3181 Bipolar II disorder: Secondary | ICD-10-CM

## 2023-06-02 DIAGNOSIS — F411 Generalized anxiety disorder: Secondary | ICD-10-CM | POA: Diagnosis not present

## 2023-06-02 DIAGNOSIS — F4310 Post-traumatic stress disorder, unspecified: Secondary | ICD-10-CM | POA: Diagnosis not present

## 2023-06-02 DIAGNOSIS — F1021 Alcohol dependence, in remission: Secondary | ICD-10-CM

## 2023-06-02 DIAGNOSIS — Z9151 Personal history of suicidal behavior: Secondary | ICD-10-CM

## 2023-06-02 NOTE — Patient Instructions (Signed)
We did not make any medication changes today.  Keep up the good work with remembering to take your medication and do your best to cut back on smoking.  When you see your PCP next please discuss that episode of passing out and get an updated EKG, lipid panel, A1c while you are on the Seroquel.
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
## 2023-06-05 ENCOUNTER — Ambulatory Visit: Payer: BC Managed Care – PPO | Admitting: Internal Medicine

## 2023-06-05 ENCOUNTER — Encounter: Payer: Self-pay | Admitting: Internal Medicine

## 2023-06-05 VITALS — BP 117/76 | HR 91 | Ht 68.0 in | Wt 207.6 lb

## 2023-06-05 DIAGNOSIS — M7652 Patellar tendinitis, left knee: Secondary | ICD-10-CM | POA: Insufficient documentation

## 2023-06-05 NOTE — Assessment & Plan Note (Signed)
Presenting today for an acute visit endorsing a 2-week history of left knee pain as described above.  Pain is located along the inferior aspect of the patella at the site of insertion of the patellar tendon.  His history and exam findings today seem most consistent with patellar tendinitis. -Treatment options were reviewed.  I have recommended as needed use of NSAIDs/Tylenol for pain relief.  He has also been provided with home PT exercises.  I have recommended use of a Cho-Pat strap while working. -He was instructed return to care if his symptoms worsen or fail to improve.  Otherwise he will return for routine follow-up on 9/20.

## 2023-06-05 NOTE — Patient Instructions (Signed)
It was a pleasure to see you today.  Thank you for giving [place] the opportunity to be involved in your care.  Below is a brief recap of your visit and next steps.  We will plan to see you again on 9/20  Summary Your exam today seems most consistent with patellar tendinitis Use ibuprofen as needed for pain relief See the attached home exercises Follow up for routine care in September

## 2023-06-05 NOTE — Progress Notes (Signed)
   Acute Office Visit  Subjective:     Patient ID: Andrew Youngers Sr., male    DOB: Dec 31, 1975, 47 y.o.   MRN: [id number]  Chief Complaint  Patient presents with   Joint Swelling    Feels like fluid is gathering on left knee. Started 05/22/2023.   Andrew Gillespie presents today for an acute visit endorsing left knee pain.  He describes a 2-week history of pain located just below the patella of the left knee.  There was no inciting event or trauma at the onset of pain 2 weeks ago.  He was on vacation at that time.  There was no pop or tearing sensation when pain began.  Pain is worse when squatting and when climbing stairs.  He also endorses discomfort with varus maneuvers of the left leg.  Review of Systems  Musculoskeletal:  Positive for joint pain (Left knee).  All other systems reviewed and are negative.     Objective:    BP 117/76   Pulse 91   Ht 5\' 8"  (1.727 m)   Wt 207 lb 9.6 oz (94.2 kg)   SpO2 94%   BMI 31.57 kg/m   Physical Exam Musculoskeletal:     Comments: There is no obvious deformity on inspection of the left knee.  No evidence of joint effusion or increased warmth compared to the right knee.  ROM is generally intact.  He endorses pain with flexion beyond 90 degrees.  Negative Lachman/anterior drawer.  Negative McMurray's.  Negative patellar apprehension.  There is tenderness palpation over the patellar tendon at the site of insertion along the inferior aspect of the patella.       Assessment & Plan:   Problem List Items Addressed This Visit       Patellar tendinitis of left knee - Primary    Presenting today for an acute visit endorsing a 2-week history of left knee pain as described above.  Pain is located along the inferior aspect of the patella at the site of insertion of the patellar tendon.  His history and exam findings today seem most consistent with patellar tendinitis. -Treatment options were reviewed.  I have recommended as needed use of NSAIDs/Tylenol for  pain relief.  He has also been provided with home PT exercises.  I have recommended use of a Cho-Pat strap while working. -He was instructed return to care if his symptoms worsen or fail to improve.  Otherwise he will return for routine follow-up on 9/20.      Return in about 7 weeks (around 07/25/2023).  Billie Lade, MD

## 2023-06-11 ENCOUNTER — Ambulatory Visit: Payer: BC Managed Care – PPO | Admitting: Internal Medicine

## 2023-07-03 ENCOUNTER — Encounter (HOSPITAL_COMMUNITY): Payer: Self-pay

## 2023-07-03 ENCOUNTER — Telehealth (HOSPITAL_COMMUNITY): Payer: BC Managed Care – PPO | Admitting: Psychiatry

## 2023-07-16 ENCOUNTER — Other Ambulatory Visit: Payer: Self-pay | Admitting: Internal Medicine

## 2023-07-16 DIAGNOSIS — E785 Hyperlipidemia, unspecified: Secondary | ICD-10-CM

## 2023-07-17 ENCOUNTER — Ambulatory Visit: Payer: BC Managed Care – PPO | Admitting: Gastroenterology

## 2023-07-22 ENCOUNTER — Encounter: Payer: Self-pay | Admitting: Orthopedic Surgery

## 2023-07-22 ENCOUNTER — Ambulatory Visit (INDEPENDENT_AMBULATORY_CARE_PROVIDER_SITE_OTHER): Payer: Worker's Compensation | Admitting: Orthopedic Surgery

## 2023-07-22 DIAGNOSIS — M67814 Other specified disorders of tendon, left shoulder: Secondary | ICD-10-CM

## 2023-07-22 DIAGNOSIS — G8929 Other chronic pain: Secondary | ICD-10-CM

## 2023-07-22 DIAGNOSIS — M25512 Pain in left shoulder: Secondary | ICD-10-CM | POA: Diagnosis not present

## 2023-07-22 NOTE — Progress Notes (Signed)
Orthopaedic Postop Note  Assessment: Levar Knierim Sr. is a 47 y.o. male s/p left shoulder arthroscopy, subacromial decompression and biceps tenodesis  DOS: 08/05/2022  Plan: Mr. Batterson has improved since I saw him last.  He takes medicines occasionally.  A physical exam, his strength and range of motion is equal to the contralateral, and uninjured side.  At this point, I think it is appropriate to release him to full activity.  No restrictions.  I provided him with a letter for work.  If his Worker's Compensation case requires any additional documentation, they will contact the clinic.  If he has aches or pains in the future, and wants to consider an injection, he can contact the clinic for a follow-up injection.  Otherwise, follow-up as needed.   Follow-up: Return if symptoms worsen or fail to improve. XR at next visit: none  Subjective:  Chief Complaint  Patient presents with   Follow-up    Recheck on left shoulder    History of Present Illness: Iann Clingerman Sr. is a 47 y.o. male who presents following the above stated procedure.  Surgery was almost a year ago.  He is doing well.  Occasional aches and pains at work.  Occasionally, he will take some ibuprofen or Tylenol.  He feels as though the injections improved his symptoms.  He is doing better than he was since I saw him last.  He is ready to return to full duty at work.   Review of Systems: No fevers or chills No numbness or tingling No Chest Pain No shortness of breath   Objective: There were no vitals taken for this visit.  Physical Exam:  Alert and oriented.  No acute distress  Left shoulder surgical incisions have healed.  No surrounding erythema or drainage.  Forward flexion 170 degrees.  Abduction at his side to 140 degrees.  Internal rotation to the lumbar spine.  External rotation to 60 degrees.  5/5 strength in the deltoid, and supraspinatus.  No tenderness to palpation over the anterior shoulder.     IMAGING: I personally ordered and reviewed the following images:  No new imaging obtained today  Oliver Barre, MD 07/22/2023 9:06 AM

## 2023-07-22 NOTE — Patient Instructions (Signed)
Provided note for work; will release at work, no restrictions effective immediately.

## 2023-07-25 ENCOUNTER — Encounter: Payer: BC Managed Care – PPO | Admitting: Internal Medicine

## 2023-07-25 ENCOUNTER — Encounter: Payer: Self-pay | Admitting: Internal Medicine

## 2023-08-03 ENCOUNTER — Other Ambulatory Visit (HOSPITAL_COMMUNITY): Payer: Self-pay | Admitting: Psychiatry

## 2023-08-03 DIAGNOSIS — F4310 Post-traumatic stress disorder, unspecified: Secondary | ICD-10-CM

## 2023-08-03 DIAGNOSIS — F3181 Bipolar II disorder: Secondary | ICD-10-CM

## 2023-08-21 ENCOUNTER — Other Ambulatory Visit (HOSPITAL_COMMUNITY): Payer: Self-pay | Admitting: Psychiatry

## 2023-08-21 DIAGNOSIS — F3181 Bipolar II disorder: Secondary | ICD-10-CM

## 2023-08-21 DIAGNOSIS — F410 Panic disorder [episodic paroxysmal anxiety] without agoraphobia: Secondary | ICD-10-CM

## 2023-08-21 DIAGNOSIS — F4310 Post-traumatic stress disorder, unspecified: Secondary | ICD-10-CM

## 2023-08-21 DIAGNOSIS — F5104 Psychophysiologic insomnia: Secondary | ICD-10-CM

## 2023-08-22 ENCOUNTER — Other Ambulatory Visit (HOSPITAL_COMMUNITY): Payer: Self-pay | Admitting: Psychiatry

## 2023-08-22 ENCOUNTER — Encounter (HOSPITAL_COMMUNITY): Payer: Self-pay | Admitting: Psychiatry

## 2023-08-22 ENCOUNTER — Encounter (HOSPITAL_COMMUNITY): Payer: Self-pay

## 2023-08-22 ENCOUNTER — Telehealth (INDEPENDENT_AMBULATORY_CARE_PROVIDER_SITE_OTHER): Payer: BC Managed Care – PPO | Admitting: Psychiatry

## 2023-08-22 DIAGNOSIS — F3181 Bipolar II disorder: Secondary | ICD-10-CM

## 2023-08-22 DIAGNOSIS — F4310 Post-traumatic stress disorder, unspecified: Secondary | ICD-10-CM

## 2023-08-22 DIAGNOSIS — F411 Generalized anxiety disorder: Secondary | ICD-10-CM

## 2023-08-22 DIAGNOSIS — F410 Panic disorder [episodic paroxysmal anxiety] without agoraphobia: Secondary | ICD-10-CM

## 2023-08-22 DIAGNOSIS — F17210 Nicotine dependence, cigarettes, uncomplicated: Secondary | ICD-10-CM

## 2023-08-22 DIAGNOSIS — G4700 Insomnia, unspecified: Secondary | ICD-10-CM

## 2023-08-22 DIAGNOSIS — Z79899 Other long term (current) drug therapy: Secondary | ICD-10-CM

## 2023-08-22 DIAGNOSIS — F5104 Psychophysiologic insomnia: Secondary | ICD-10-CM

## 2023-08-22 DIAGNOSIS — Z720 Tobacco use: Secondary | ICD-10-CM

## 2023-08-22 MED ORDER — LAMOTRIGINE 200 MG PO TABS
200.0000 mg | ORAL_TABLET | Freq: Every evening | ORAL | 2 refills | Status: DC
Start: 2023-08-22 — End: 2023-08-25

## 2023-08-22 MED ORDER — GABAPENTIN 300 MG PO CAPS
300.0000 mg | ORAL_CAPSULE | Freq: Two times a day (BID) | ORAL | 1 refills | Status: DC | PRN
Start: 2023-08-22 — End: 2023-09-19

## 2023-08-22 MED ORDER — ARIPIPRAZOLE 2 MG PO TABS
2.0000 mg | ORAL_TABLET | Freq: Every day | ORAL | 2 refills | Status: DC
Start: 2023-08-22 — End: 2023-09-15

## 2023-08-22 MED ORDER — LAMOTRIGINE 100 MG PO TABS: 100.0000 mg | ORAL_TABLET | Freq: Every day | ORAL | 2 refills | Status: DC
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
## 2023-08-22 NOTE — Patient Instructions (Signed)
We added abilify (aripiprazole) to your regimen today at 2mg  daily. You can switch this to night dosing if it makes you sleepy.

## 2023-08-25 MED ORDER — LAMOTRIGINE 200 MG PO TABS
200.0000 mg | ORAL_TABLET | Freq: Every evening | ORAL | 2 refills | Status: DC
Start: 2023-08-25 — End: 2023-09-19

## 2023-08-25 NOTE — Addendum Note (Signed)
Addended by: Tia Masker on: 08/25/2023 11:19 AM   Modules accepted: Orders

## 2023-09-08 ENCOUNTER — Encounter: Payer: Self-pay | Admitting: Orthopedic Surgery

## 2023-09-08 MED ORDER — CYCLOBENZAPRINE HCL 10 MG PO TABS: 10.0000 mg | ORAL_TABLET | Freq: Two times a day (BID) | ORAL | 0 refills | Status: AC | PRN

## 2023-09-13 ENCOUNTER — Other Ambulatory Visit (HOSPITAL_COMMUNITY): Payer: Self-pay | Admitting: Psychiatry

## 2023-09-13 DIAGNOSIS — F3181 Bipolar II disorder: Secondary | ICD-10-CM

## 2023-09-17 DIAGNOSIS — J22 Unspecified acute lower respiratory infection: Secondary | ICD-10-CM | POA: Diagnosis not present

## 2023-09-17 DIAGNOSIS — J101 Influenza due to other identified influenza virus with other respiratory manifestations: Secondary | ICD-10-CM | POA: Diagnosis not present

## 2023-09-17 DIAGNOSIS — R051 Acute cough: Secondary | ICD-10-CM | POA: Diagnosis not present

## 2023-09-17 DIAGNOSIS — U071 COVID-19: Secondary | ICD-10-CM | POA: Diagnosis not present

## 2023-09-19 ENCOUNTER — Telehealth (INDEPENDENT_AMBULATORY_CARE_PROVIDER_SITE_OTHER): Payer: BC Managed Care – PPO | Admitting: Psychiatry

## 2023-09-19 ENCOUNTER — Encounter (HOSPITAL_COMMUNITY): Payer: Self-pay | Admitting: Psychiatry

## 2023-09-19 DIAGNOSIS — F5104 Psychophysiologic insomnia: Secondary | ICD-10-CM

## 2023-09-19 DIAGNOSIS — F3181 Bipolar II disorder: Secondary | ICD-10-CM | POA: Diagnosis not present

## 2023-09-19 DIAGNOSIS — F411 Generalized anxiety disorder: Secondary | ICD-10-CM

## 2023-09-19 DIAGNOSIS — Z79899 Other long term (current) drug therapy: Secondary | ICD-10-CM

## 2023-09-19 DIAGNOSIS — F4310 Post-traumatic stress disorder, unspecified: Secondary | ICD-10-CM | POA: Diagnosis not present

## 2023-09-19 DIAGNOSIS — Z720 Tobacco use: Secondary | ICD-10-CM

## 2023-09-19 DIAGNOSIS — F410 Panic disorder [episodic paroxysmal anxiety] without agoraphobia: Secondary | ICD-10-CM

## 2023-09-19 MED ORDER — GABAPENTIN 300 MG PO CAPS: 300.0000 mg | ORAL_CAPSULE | Freq: Two times a day (BID) | ORAL | 0 refills | Status: DC | PRN

## 2023-09-19 MED ORDER — ARIPIPRAZOLE 2 MG PO TABS
2.0000 mg | ORAL_TABLET | Freq: Every day | ORAL | 0 refills | Status: DC
Start: 2023-09-19 — End: 2023-11-28

## 2023-09-19 MED ORDER — LAMOTRIGINE 200 MG PO TABS: 200.0000 mg | ORAL_TABLET | Freq: Every evening | ORAL | 0 refills | Status: DC

## 2023-09-19 MED ORDER — LAMOTRIGINE 100 MG PO TABS: 100.0000 mg | ORAL_TABLET | Freq: Every day | ORAL | 0 refills | Status: DC

## 2023-09-19 NOTE — Patient Instructions (Signed)
Did not make any medication changes today.  Be sure to keep your follow-up with your PCP in order to get the EKG, lipid panel, A1c while you are on Abilify.
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
## 2023-09-22 ENCOUNTER — Telehealth: Payer: Self-pay | Admitting: Internal Medicine

## 2023-09-22 NOTE — Telephone Encounter (Signed)
[employer] screening form (Insurance)   Noted  Copied Sleeved  Original in PCP box Copy front desk folder

## 2023-10-07 ENCOUNTER — Ambulatory Visit (INDEPENDENT_AMBULATORY_CARE_PROVIDER_SITE_OTHER): Payer: BC Managed Care – PPO | Admitting: Internal Medicine

## 2023-10-07 ENCOUNTER — Encounter: Payer: Self-pay | Admitting: Internal Medicine

## 2023-10-07 VITALS — BP 124/77 | HR 103 | Ht 68.0 in | Wt 208.4 lb

## 2023-10-07 DIAGNOSIS — K219 Gastro-esophageal reflux disease without esophagitis: Secondary | ICD-10-CM

## 2023-10-07 DIAGNOSIS — E781 Pure hyperglyceridemia: Secondary | ICD-10-CM | POA: Diagnosis not present

## 2023-10-07 DIAGNOSIS — E782 Mixed hyperlipidemia: Secondary | ICD-10-CM

## 2023-10-07 DIAGNOSIS — F3181 Bipolar II disorder: Secondary | ICD-10-CM

## 2023-10-07 DIAGNOSIS — M7918 Myalgia, other site: Secondary | ICD-10-CM | POA: Insufficient documentation

## 2023-10-07 DIAGNOSIS — Z720 Tobacco use: Secondary | ICD-10-CM

## 2023-10-07 DIAGNOSIS — Z0001 Encounter for general adult medical examination with abnormal findings: Secondary | ICD-10-CM

## 2023-10-07 DIAGNOSIS — G8929 Other chronic pain: Secondary | ICD-10-CM

## 2023-10-07 NOTE — Assessment & Plan Note (Signed)
Followed by psychiatry (Dr. Adrian Blackwater).  Currently prescribed Lamictal, Abilify, and gabapentin.  Reports that his mood is stable.  ECG updated today in the setting of recent medication changes.  Psychiatry follow-up is scheduled for January.

## 2023-10-07 NOTE — Assessment & Plan Note (Signed)
Lipid panel updated in December 2023.  Total cholesterol 168, LDL 61, TGs 432.  He is currently prescribed atorvastatin 20 mg daily and has added fish oil supplementation. -Repeat lipid panel ordered today

## 2023-10-07 NOTE — Assessment & Plan Note (Signed)
Symptoms are adequately controlled with Protonix 40 mg twice daily.

## 2023-10-07 NOTE — Assessment & Plan Note (Signed)
His acute concern today is current musculoskeletal pain in his upper extremities.  Symptoms are worse during colder months.  I recommended taking Tylenol every 12 hours as needed for pain relief and applying Voltaren gel to areas of pain on his hands and wrists.

## 2023-10-07 NOTE — Patient Instructions (Signed)
It was a pleasure to see you today.  Thank you for giving [place] the opportunity to be involved in your care.  Below is a brief recap of your visit and next steps.  We will plan to see you again in 6 months.  Summary Annual physical completed today Repeat labs ordered As we discussed, I recommend tylenol and voltaren gel for pain relief Follow up in 6 months for routine care

## 2023-10-07 NOTE — Progress Notes (Signed)
Complete physical exam  Patient: Andrew Lutzow Sr.   DOB: 15-Mar-1976   47 y.o. Male  MRN: [id number]  Subjective:    Chief Complaint  Patient presents with   Annual Exam    Andrew Virgilio Sr. is a 47 y.o. male who presents today for a complete physical exam. He reports consuming a general diet. The patient has a physically strenuous job, but has no regular exercise apart from work.  He generally feels fairly well. He reports sleeping poorly. He does have additional problems to discuss today. He endorses chronic musculoskeletal pain in his upper extremities. Symptoms are worse in colder months.    Most recent fall risk assessment:    10/07/2023    8:56 AM  Fall Risk   Falls in the past year? 0  Number falls in past yr: 0  Injury with Fall? 0  Risk for fall due to : No Fall Risks  Follow up Falls evaluation completed     Most recent depression screenings:    10/07/2023    8:56 AM 06/05/2023    9:54 AM  PHQ 2/9 Scores  PHQ - 2 Score 3 2  PHQ- 9 Score 16 12    Vision:Within last year and Dental: No regular dental care   Patient Active Problem List   Diagnosis Date Noted   Chronic musculoskeletal pain 10/07/2023   Encounter for well adult exam with abnormal findings 10/07/2023   Patellar tendinitis of left knee 06/05/2023   Hypertriglyceridemia 04/18/2023   Long term current use of antipsychotic medication 12/18/2022   Hyperlipidemia 10/16/2022   Generalized anxiety disorder with panic attacks 09/05/2022   History of attempted suicide x3 09/05/2022   Dysphagia 07/30/2022   FH: colon polyps 07/30/2022   Elevated alkaline phosphatase level 07/30/2022   Left rotator cuff tear 07/12/2022   Tobacco use 07/12/2022   Preventative health care 07/12/2022   PTSD (post-traumatic stress disorder) 11/23/2018   Fatigue 07/05/2015   Insomnia 07/05/2015   Bipolar 2 disorder, major depressive episode (HCC) 07/05/2015   Esophageal reflux 02/16/2013   Past Medical History:   Diagnosis Date   Alcohol use disorder, moderate, in sustained remission (HCC) 09/05/2022   Hyperlipidemia    Hypoglycemia    PONV (postoperative nausea and vomiting)    Stroke (HCC)    mini stroke 2008   Past Surgical History:  Procedure Laterality Date   APPENDECTOMY     BALLOON DILATION N/A 10/15/2022   Procedure: BALLOON DILATION;  Surgeon: Lanelle Bal, DO;  Location: [facility];  Service: Endoscopy;  Laterality: N/A;   BIOPSY  10/15/2022   Procedure: BIOPSY;  Surgeon: Lanelle Bal, DO;  Location: [facility];  Service: Endoscopy;;   COLONOSCOPY WITH PROPOFOL N/A 10/15/2022   Procedure: COLONOSCOPY WITH PROPOFOL;  Surgeon: Lanelle Bal, DO;  Location: [facility];  Service: Endoscopy;  Laterality: N/A;  9:00 am   ESOPHAGOGASTRODUODENOSCOPY (EGD) WITH PROPOFOL N/A 10/15/2022   Procedure: ESOPHAGOGASTRODUODENOSCOPY (EGD) WITH PROPOFOL;  Surgeon: Lanelle Bal, DO;  Location: [facility];  Service: Endoscopy;  Laterality: N/A;   HERNIA REPAIR     POLYPECTOMY  10/15/2022   Procedure: POLYPECTOMY;  Surgeon: Lanelle Bal, DO;  Location: [facility];  Service: Endoscopy;;   SHOULDER ARTHROSCOPY WITH ROTATOR CUFF REPAIR Left 08/05/2022   Procedure: SHOULDER ARTHROSCOPY WITH DEBRIDEMENT AND BICEPS TENODESIS;  Surgeon: Oliver Barre, MD;  Location: [facility];  Service: Orthopedics;  Laterality: Left;   Social History  Tobacco Use   Smoking status: Every Day    Current packs/day: 0.50    Average packs/day: 0.5 packs/day for 34.9 years (17.5 ttl pk-yrs)    Types: Cigarettes    Start date: 11/04/1988   Smokeless tobacco: Former    Quit date: 11/13/2012  Vaping Use   Vaping status: Never Used  Substance Use Topics   Alcohol use: Not Currently    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   Drug use: No   Family History  Problem Relation Age of Onset   Diabetes Mother    Mental illness Mother    Diabetes Father    Heart attack Father    Mental illness  Father    Colon polyps Father        frequent colonoscopies, every 2 years   Diabetes Sister    Mental illness Brother    Diabetes Maternal Grandmother    Stroke Maternal Grandmother    Diabetes Maternal Grandfather    Heart attack Maternal Grandfather    Congestive Heart Failure Maternal Grandfather    Heart attack Paternal Grandmother    Heart attack Paternal Grandfather    Colon cancer Neg Hx    Allergies  Allergen Reactions   Penicillins Anaphylaxis and Hives    Has patient had a PCN reaction causing immediate rash, facial/tongue/throat swelling, SOB or lightheadedness with hypotension: yes Has patient had a PCN reaction causing severe rash involving mucus membranes or skin necrosis: yes Has patient had a PCN reaction that required hospitalization: unknown Has patient had a PCN reaction occurring within the last 10 years: No If all of the above answers are "NO", then may proceed with Cephalosporin use.    Dimetapp Golden Pop Bromm] Other (See Comments)    Child hood allergy   Promethazine Hcl Other (See Comments)    paranoid   Wellbutrin [Bupropion] Anxiety   Patient Care Team: Billie Lade, MD as PCP - General (Internal Medicine)   Outpatient Medications Prior to Visit  Medication Sig   acetaminophen (TYLENOL) 500 MG tablet Take 1,000 mg by mouth every 6 (six) hours as needed for moderate pain.   ARIPiprazole (ABILIFY) 2 MG tablet Take 1 tablet (2 mg total) by mouth daily.   aspirin EC 81 MG tablet Take 81 mg by mouth 2 (two) times a week. Swallow whole.   atorvastatin (LIPITOR) 20 MG tablet TAKE 1 TABLET BY MOUTH EVERY DAY   cyclobenzaprine (FLEXERIL) 10 MG tablet Take 1 tablet (10 mg total) by mouth 2 (two) times daily as needed.   gabapentin (NEURONTIN) 300 MG capsule Take 1 capsule (300 mg total) by mouth 2 (two) times daily as needed (anxiety, insomnia).   ibuprofen (ADVIL) 200 MG tablet Take 400 mg by mouth every 6 (six) hours as needed for moderate pain.    lamoTRIgine (LAMICTAL) 100 MG tablet Take 1 tablet (100 mg total) by mouth daily.   lamoTRIgine (LAMICTAL) 200 MG tablet Take 1 tablet (200 mg total) by mouth at bedtime.   pantoprazole (PROTONIX) 40 MG tablet TAKE 1 TABLET (40 MG TOTAL) BY MOUTH TWICE A DAY BEFORE MEALS   [DISCONTINUED] doxycycline (VIBRAMYCIN) 100 MG capsule Take 100 mg by mouth 2 (two) times daily.   No facility-administered medications prior to visit.   Review of Systems  Musculoskeletal:  Positive for joint pain (Bilateral shoulders, wrists, hands).  All other systems reviewed and are negative.     Objective:     BP 124/77 (BP Location: Right Arm, Patient  Position: Sitting, Cuff Size: Normal)   Pulse (!) 103   Ht 5\' 8"  (1.727 m)   Wt 208 lb 6.4 oz (94.5 kg)   SpO2 93%   BMI 31.69 kg/m  BP Readings from Last 3 Encounters:  10/07/23 124/77  06/05/23 117/76  05/20/23 119/81   Wt Readings from Last 3 Encounters:  10/07/23 208 lb 6.4 oz (94.5 kg)  06/05/23 207 lb 9.6 oz (94.2 kg)  05/20/23 208 lb (94.3 kg)   Physical Exam Vitals reviewed.  Constitutional:      General: He is not in acute distress.    Appearance: Normal appearance. He is obese. He is not ill-appearing.  HENT:     Head: Normocephalic and atraumatic.     Right Ear: External ear normal.     Left Ear: External ear normal.     Nose: Nose normal. No congestion or rhinorrhea.     Mouth/Throat:     Mouth: Mucous membranes are moist.     Pharynx: Oropharynx is clear.  Eyes:     General: No scleral icterus.    Extraocular Movements: Extraocular movements intact.     Conjunctiva/sclera: Conjunctivae normal.     Pupils: Pupils are equal, round, and reactive to light.  Cardiovascular:     Rate and Rhythm: Normal rate and regular rhythm.     Pulses: Normal pulses.     Heart sounds: Normal heart sounds. No murmur heard. Pulmonary:     Effort: Pulmonary effort is normal.     Breath sounds: Normal breath sounds. No wheezing, rhonchi or rales.   Abdominal:     General: Abdomen is flat. Bowel sounds are normal. There is no distension.     Palpations: Abdomen is soft.     Tenderness: There is no abdominal tenderness.  Musculoskeletal:        General: No swelling or deformity. Normal range of motion.     Cervical back: Normal range of motion.  Skin:    General: Skin is warm and dry.     Capillary Refill: Capillary refill takes less than 2 seconds.  Neurological:     General: No focal deficit present.     Mental Status: He is alert and oriented to person, place, and time.     Motor: No weakness.  Psychiatric:        Mood and Affect: Mood normal.        Behavior: Behavior normal.        Thought Content: Thought content normal.   Last CBC Lab Results  Component Value Date   WBC 7.6 08/01/2022   HGB 14.7 08/01/2022   HCT 43.2 08/01/2022   MCV 92.3 08/01/2022   MCH 31.4 08/01/2022   RDW 12.5 08/01/2022   PLT 235 08/01/2022   Last metabolic panel Lab Results  Component Value Date   GLUCOSE 104 (H) 10/11/2022   NA 142 10/11/2022   K 4.4 10/11/2022   CL 105 10/11/2022   CO2 18 (L) 10/11/2022   BUN 8 10/11/2022   CREATININE 0.90 10/11/2022   EGFR 107 10/11/2022   CALCIUM 9.4 10/11/2022   PROT 6.9 11/29/2022   ALBUMIN 4.6 10/11/2022   LABGLOB 2.3 10/11/2022   AGRATIO 2.0 10/11/2022   BILITOT 0.4 11/29/2022   ALKPHOS 139 (H) 10/11/2022   AST 18 11/29/2022   ALT 37 11/29/2022   ANIONGAP 8 08/01/2022   Last lipids Lab Results  Component Value Date   CHOL 168 10/11/2022   HDL 41 10/11/2022  LDLCALC 61 10/11/2022   TRIG 432 (H) 10/11/2022   CHOLHDL 4.1 10/11/2022   Last hemoglobin A1c Lab Results  Component Value Date   HGBA1C 5.3 07/12/2022   Last thyroid functions Lab Results  Component Value Date   TSH 2.150 08/03/2015   Last vitamin B12 and Folate Lab Results  Component Value Date   VITAMINB12 514 07/12/2022   FOLATE 9.6 07/12/2022      Assessment & Plan:    Routine Health Maintenance and  Physical Exam  Immunization History  Administered Date(s) Administered   Influenza,inj,Quad PF,6+ Mos 09/24/2016   Rabies, IM 07/09/2012, 07/13/2012, 07/16/2012   Rabies, intradermal 07/09/2012   Tdap 07/19/2016    Health Maintenance  Topic Date Due   COVID-19 Vaccine (1) Never done   INFLUENZA VACCINE  02/02/2024 (Originally 06/05/2023)   DTaP/Tdap/Td (2 - Td or Tdap) 07/19/2026   Colonoscopy  10/15/2032   Hepatitis C Screening  Completed   HIV Screening  Completed   HPV VACCINES  Aged Out    Discussed health benefits of physical activity, and encouraged him to engage in regular exercise appropriate for his age and condition.  Problem List Items Addressed This Visit       Esophageal reflux    Symptoms are adequately controlled with Protonix 40 mg twice daily.      Bipolar 2 disorder, major depressive episode (HCC) - Primary (Chronic)    Followed by psychiatry (Dr. Adrian Blackwater).  Currently prescribed Lamictal, Abilify, and gabapentin.  Reports that his mood is stable.  ECG updated today in the setting of recent medication changes.  Psychiatry follow-up is scheduled for January.      Tobacco use    He has increased to smoking 1 pack/day of cigarettes and has been smoking for at least 24 years.  Previously reported gradually working towards cessation, but is currently precontemplative. -The patient was counseled on the dangers of tobacco use, and was advised to quit.  Reviewed strategies to maximize success, including removing cigarettes and smoking materials from environment, stress management, substitution of other forms of reinforcement, support of family/friends, and written materials.       Hyperlipidemia    Lipid panel updated in December 2023.  Total cholesterol 168, LDL 61, TGs 432.  He is currently prescribed atorvastatin 20 mg daily and has added fish oil supplementation. -Repeat lipid panel ordered today      Chronic musculoskeletal pain    His acute concern today is  current musculoskeletal pain in his upper extremities.  Symptoms are worse during colder months.  I recommended taking Tylenol every 12 hours as needed for pain relief and applying Voltaren gel to areas of pain on his hands and wrists.      Encounter for well adult exam with abnormal findings    Annual physical exam completed today.  Previous records and labs reviewed. -Repeat labs ordered today -Additional preventative care items are largely up-to-date -We will tentatively plan for follow-up in 6 months      Return in about 6 months (around 04/06/2024).  Billie Lade, MD

## 2023-10-07 NOTE — Assessment & Plan Note (Signed)
He has increased to smoking 1 pack/day of cigarettes and has been smoking for at least 24 years.  Previously reported gradually working towards cessation, but is currently precontemplative. -The patient was counseled on the dangers of tobacco use, and was advised to quit.  Reviewed strategies to maximize success, including removing cigarettes and smoking materials from environment, stress management, substitution of other forms of reinforcement, support of family/friends, and written materials.

## 2023-10-07 NOTE — Assessment & Plan Note (Signed)
Annual physical exam completed today.  Previous records and labs reviewed. -Repeat labs ordered today -Additional preventative care items are largely up-to-date -We will tentatively plan for follow-up in 6 months

## 2023-10-08 ENCOUNTER — Encounter (HOSPITAL_COMMUNITY): Payer: Self-pay

## 2023-10-08 ENCOUNTER — Other Ambulatory Visit: Payer: Self-pay | Admitting: Internal Medicine

## 2023-10-08 DIAGNOSIS — E559 Vitamin D deficiency, unspecified: Secondary | ICD-10-CM

## 2023-10-08 LAB — CBC WITH DIFFERENTIAL/PLATELET
Basophils Absolute: 0 10*3/uL (ref 0.0–0.2)
Basos: 1 %
EOS (ABSOLUTE): 0.2 10*3/uL (ref 0.0–0.4)
Eos: 2 %
Hematocrit: 43.8 % (ref 37.5–51.0)
Hemoglobin: 15.2 g/dL (ref 13.0–17.7)
Immature Grans (Abs): 0 10*3/uL (ref 0.0–0.1)
Immature Granulocytes: 0 %
Lymphocytes Absolute: 1.6 10*3/uL (ref 0.7–3.1)
Lymphs: 21 %
MCH: 31.7 pg (ref 26.6–33.0)
MCHC: 34.7 g/dL (ref 31.5–35.7)
MCV: 91 fL (ref 79–97)
Monocytes Absolute: 0.5 10*3/uL (ref 0.1–0.9)
Monocytes: 7 %
Neutrophils Absolute: 5.3 10*3/uL (ref 1.4–7.0)
Neutrophils: 69 %
Platelets: 285 10*3/uL (ref 150–450)
RBC: 4.8 x10E6/uL (ref 4.14–5.80)
RDW: 12.2 % (ref 11.6–15.4)
WBC: 7.6 10*3/uL (ref 3.4–10.8)

## 2023-10-08 LAB — CMP14+EGFR
ALT: 26 [IU]/L (ref 0–44)
AST: 18 [IU]/L (ref 0–40)
Albumin: 4.5 g/dL (ref 4.1–5.1)
Alkaline Phosphatase: 122 [IU]/L — ABNORMAL HIGH (ref 44–121)
BUN/Creatinine Ratio: 11 (ref 9–20)
BUN: 10 mg/dL (ref 6–24)
Bilirubin Total: 0.3 mg/dL (ref 0.0–1.2)
CO2: 22 mmol/L (ref 20–29)
Calcium: 9.9 mg/dL (ref 8.7–10.2)
Chloride: 106 mmol/L (ref 96–106)
Creatinine, Ser: 0.91 mg/dL (ref 0.76–1.27)
Globulin, Total: 2.3 g/dL (ref 1.5–4.5)
Glucose: 114 mg/dL — ABNORMAL HIGH (ref 70–99)
Potassium: 4.1 mmol/L (ref 3.5–5.2)
Sodium: 142 mmol/L (ref 134–144)
Total Protein: 6.8 g/dL (ref 6.0–8.5)
eGFR: 105 mL/min/{1.73_m2} (ref >59)

## 2023-10-08 LAB — VITAMIN D 25 HYDROXY (VIT D DEFICIENCY, FRACTURES): Vit D, 25-Hydroxy: 10 ng/mL — ABNORMAL LOW (ref 30.0–100.0)

## 2023-10-08 LAB — B12 AND FOLATE PANEL
Folate: 9.6 ng/mL (ref >3.0)
Vitamin B-12: 447 pg/mL (ref 232–1245)

## 2023-10-08 LAB — LIPID PANEL
Chol/HDL Ratio: 3.4 {ratio} (ref 0.0–5.0)
Cholesterol, Total: 160 mg/dL (ref 100–199)
HDL: 47 mg/dL (ref >39)
LDL Chol Calc (NIH): 80 mg/dL (ref 0–99)
Triglycerides: 197 mg/dL — ABNORMAL HIGH (ref 0–149)
VLDL Cholesterol Cal: 33 mg/dL (ref 5–40)

## 2023-10-08 LAB — HEMOGLOBIN A1C
Est. average glucose Bld gHb Est-mCnc: 117 mg/dL
Hgb A1c MFr Bld: 5.7 % — ABNORMAL HIGH (ref 4.8–5.6)

## 2023-10-08 LAB — TSH+FREE T4
Free T4: 1.31 ng/dL (ref 0.82–1.77)
TSH: 1.63 u[IU]/mL (ref 0.450–4.500)

## 2023-10-08 MED ORDER — VITAMIN D (ERGOCALCIFEROL) 1.25 MG (50000 UNIT) PO CAPS: 50000.0000 [IU] | ORAL_CAPSULE | ORAL | 0 refills | Status: DC

## 2023-10-08 NOTE — Telephone Encounter (Signed)
Form faxed to [phone]

## 2023-11-16 ENCOUNTER — Other Ambulatory Visit (HOSPITAL_COMMUNITY): Payer: Self-pay | Admitting: Psychiatry

## 2023-11-16 DIAGNOSIS — F410 Panic disorder [episodic paroxysmal anxiety] without agoraphobia: Secondary | ICD-10-CM

## 2023-11-16 DIAGNOSIS — F3181 Bipolar II disorder: Secondary | ICD-10-CM

## 2023-11-16 DIAGNOSIS — F5104 Psychophysiologic insomnia: Secondary | ICD-10-CM

## 2023-11-16 DIAGNOSIS — F4310 Post-traumatic stress disorder, unspecified: Secondary | ICD-10-CM

## 2023-11-28 ENCOUNTER — Encounter (HOSPITAL_COMMUNITY): Payer: Self-pay

## 2023-11-28 ENCOUNTER — Encounter (HOSPITAL_COMMUNITY): Payer: Self-pay | Admitting: Psychiatry

## 2023-11-28 ENCOUNTER — Telehealth (INDEPENDENT_AMBULATORY_CARE_PROVIDER_SITE_OTHER): Payer: BC Managed Care – PPO | Admitting: Psychiatry

## 2023-11-28 DIAGNOSIS — F4310 Post-traumatic stress disorder, unspecified: Secondary | ICD-10-CM | POA: Diagnosis not present

## 2023-11-28 DIAGNOSIS — Z79899 Other long term (current) drug therapy: Secondary | ICD-10-CM

## 2023-11-28 DIAGNOSIS — F411 Generalized anxiety disorder: Secondary | ICD-10-CM | POA: Diagnosis not present

## 2023-11-28 DIAGNOSIS — Z720 Tobacco use: Secondary | ICD-10-CM

## 2023-11-28 DIAGNOSIS — F3181 Bipolar II disorder: Secondary | ICD-10-CM | POA: Diagnosis not present

## 2023-11-28 DIAGNOSIS — F410 Panic disorder [episodic paroxysmal anxiety] without agoraphobia: Secondary | ICD-10-CM

## 2023-11-28 MED ORDER — LAMOTRIGINE 200 MG PO TABS: 200.0000 mg | ORAL_TABLET | Freq: Every evening | ORAL | 0 refills | Status: DC

## 2023-11-28 MED ORDER — ARIPIPRAZOLE 2 MG PO TABS: 2.0000 mg | ORAL_TABLET | Freq: Every day | ORAL | 0 refills | Status: DC

## 2023-11-28 MED ORDER — LAMOTRIGINE 100 MG PO TABS: 100.0000 mg | ORAL_TABLET | Freq: Every day | ORAL | 0 refills | Status: DC
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
## 2023-11-28 NOTE — Patient Instructions (Signed)
We did not make any medication changes today.  Thank you for getting the monitoring labs while you are on Abilify.  Keep up the good work with trying to cut back on smoking.

## 2023-12-11 ENCOUNTER — Other Ambulatory Visit: Payer: Self-pay

## 2023-12-11 ENCOUNTER — Emergency Department (HOSPITAL_COMMUNITY): Payer: BC Managed Care – PPO

## 2023-12-11 ENCOUNTER — Emergency Department (HOSPITAL_COMMUNITY)
Admission: EM | Admit: 2023-12-11 | Discharge: 2023-12-11 | Disposition: A | Discharge status: Home / Self Care | Payer: BC Managed Care – PPO | Attending: Emergency Medicine | Admitting: Emergency Medicine

## 2023-12-11 DIAGNOSIS — E782 Mixed hyperlipidemia: Secondary | ICD-10-CM | POA: Diagnosis not present

## 2023-12-11 DIAGNOSIS — R072 Precordial pain: Secondary | ICD-10-CM

## 2023-12-11 DIAGNOSIS — E781 Pure hyperglyceridemia: Secondary | ICD-10-CM | POA: Insufficient documentation

## 2023-12-11 DIAGNOSIS — Z7982 Long term (current) use of aspirin: Secondary | ICD-10-CM | POA: Diagnosis not present

## 2023-12-11 DIAGNOSIS — R0602 Shortness of breath: Secondary | ICD-10-CM | POA: Diagnosis not present

## 2023-12-11 DIAGNOSIS — F17200 Nicotine dependence, unspecified, uncomplicated: Secondary | ICD-10-CM | POA: Insufficient documentation

## 2023-12-11 DIAGNOSIS — Z720 Tobacco use: Secondary | ICD-10-CM

## 2023-12-11 DIAGNOSIS — R079 Chest pain, unspecified: Secondary | ICD-10-CM | POA: Diagnosis not present

## 2023-12-11 DIAGNOSIS — M79602 Pain in left arm: Secondary | ICD-10-CM | POA: Diagnosis not present

## 2023-12-11 LAB — CBC
HCT: 46 % (ref 39.0–52.0)
Hemoglobin: 15.5 g/dL (ref 13.0–17.0)
MCH: 31 pg (ref 26.0–34.0)
MCHC: 33.7 g/dL (ref 30.0–36.0)
MCV: 92 fL (ref 80.0–100.0)
Platelets: 244 10*3/uL (ref 150–400)
RBC: 5 MIL/uL (ref 4.22–5.81)
RDW: 12.9 % (ref 11.5–15.5)
WBC: 9.9 10*3/uL (ref 4.0–10.5)
nRBC: 0 % (ref 0.0–0.2)

## 2023-12-11 LAB — BASIC METABOLIC PANEL
Anion gap: 9 (ref 5–15)
BUN: 11 mg/dL (ref 6–20)
CO2: 25 mmol/L (ref 22–32)
Calcium: 9.3 mg/dL (ref 8.9–10.3)
Chloride: 105 mmol/L (ref 98–111)
Creatinine, Ser: 0.92 mg/dL (ref 0.61–1.24)
GFR, Estimated: >60 mL/min (ref >60)
Glucose, Bld: 100 mg/dL — ABNORMAL HIGH (ref 70–99)
Potassium: 4 mmol/L (ref 3.5–5.1)
Sodium: 139 mmol/L (ref 135–145)

## 2023-12-11 LAB — TROPONIN I (HIGH SENSITIVITY)
Troponin I (High Sensitivity): 2 ng/L (ref <18)
Troponin I (High Sensitivity): 3 ng/L (ref <18)

## 2023-12-11 MED ORDER — ALUM & MAG HYDROXIDE-SIMETH 200-200-20 MG/5ML PO SUSP
30.0000 mL | Freq: Once | ORAL | Status: AC
  Administered 2023-12-11: 30 mL via ORAL
  Filled 2023-12-11: qty 30

## 2023-12-11 MED ORDER — METHOCARBAMOL 500 MG PO TABS
500.0000 mg | ORAL_TABLET | Freq: Once | ORAL | Status: AC
  Administered 2023-12-11: 500 mg via ORAL
  Filled 2023-12-11: qty 1

## 2023-12-11 MED ORDER — LIDOCAINE 5 % EX PTCH
1.0000 | MEDICATED_PATCH | CUTANEOUS | Status: DC
  Administered 2023-12-11: 1 via TRANSDERMAL
  Filled 2023-12-11: qty 1

## 2023-12-11 NOTE — Discharge Instructions (Addendum)
 You were seen in the emergency department today for chest pain.  As we discussed your lab work, EKG, chest x-ray all looked reassuring today.  However, given your risk factors I have evaluated referral to cardiology to follow-up with.  They should call you in the next few days to schedule an appointment.  If they do not call you within you will need to reach out yourself.  Please follow-up with your primary care doctor as well.  I recommend monitoring your stress levels.  Continue to monitor how you are doing overall, and return to the emergency department for any new or worsening symptoms such as: Worsening pain or pain with exertion, difficulty breathing, sweating, or pain or swelling in your legs.

## 2023-12-11 NOTE — ED Triage Notes (Signed)
 Pt. Stated, I started having chest pain with SOB with some left arm pain.

## 2023-12-11 NOTE — ED Provider Triage Note (Signed)
 Emergency Medicine Provider Triage Evaluation Note  Peggye Hamilton Dial Sr. , a 48 y.o. male  was evaluated in triage.  Pt complains of pain in the chest, got worse while he was at work at [employer].  Patient says symptoms began last evening thought it was indigestion.  Review of Systems    Physical Exam  BP 118/85 (BP Location: Right Arm)   Pulse 89   Temp (!) 97.5 F (36.4 C)   Resp 18   SpO2 99%  Gen:   Awake, no distress   Resp:  Normal effort  MSK:   Moves extremities without difficulty  Other:  Regular rate and rhythm  Medical Decision Making  Medically screening exam initiated at 10:32 AM.  Appropriate orders placed.  Peggye Hamilton Caponigro Sr. was informed that the remainder of the evaluation will be completed by another provider, this initial triage assessment does not replace that evaluation, and the importance of remaining in the ED until their evaluation is complete.  48 year old male here today for chest pain, radiation to the left shoulder.  Initial EKG nonischemic.  Troponin ordered.  Story is a bit concerning, will monitor for blood work.   Mannie Pac T, DO 12/11/23 1037

## 2023-12-11 NOTE — ED Provider Notes (Signed)
 [place] EMERGENCY DEPARTMENT AT [facility] Provider Note   CSN: [id number] Arrival date & time: 12/11/23  9052     History  Chief Complaint  Patient presents with   Chest Pain   Shortness of Breath   Arm Pain    Andrew Hamilton Wilkowski Sr. is a 48 y.o. male.  Patient with history of alcohol abuse in remission, hyperlipidemia, tobacco use presents today with complaints of chest pain.  States that same began yesterday evening around 11 PM and has persisted overnight.  States that he went to work at [employer] today and his pain worsened.  Presents for evaluation of same.  States that about an hour after arrival today his symptoms improved significantly and are now very mild.  Pain is left-sided in nature and feels like pressure and did radiate into his left arm.  Denies any history of similar symptoms previously.  No personal cardiac history, however does have a direct family member who had a heart attack in their early 54s.  Does note that he smokes daily.  Denies any other recreational drug use.  Initially noted some shortness of breath and diaphoresis, however states that those symptoms have resolved.  Pain is not exertional or relieved with rest.  Denies leg pain or leg swelling, PND or orthopnea.  The history is provided by the patient. No language interpreter was used.  Chest Pain Associated symptoms: shortness of breath (resolved)   Shortness of Breath Associated symptoms: chest pain   Arm Pain Associated symptoms include chest pain and shortness of breath (resolved).       Home Medications Prior to Admission medications   Medication Sig Start Date End Date Taking? Authorizing Provider  Vitamin D , Ergocalciferol , (DRISDOL ) 1.25 MG (50000 UNIT) CAPS capsule Take 1 capsule (50,000 Units total) by mouth every 7 (seven) days for 12 doses. 10/08/23 12/25/23  Melvenia Manus BRAVO, MD  acetaminophen  (TYLENOL ) 500 MG tablet Take 1,000 mg by mouth every 6 (six) hours as needed for  moderate pain.    [provider]  ARIPiprazole  (ABILIFY ) 2 MG tablet Take 1 tablet (2 mg total) by mouth daily. 11/28/23   Barbra Jayson LABOR, MD  aspirin EC 81 MG tablet Take 81 mg by mouth 2 (two) times a week. Swallow whole.    [provider]  atorvastatin  (LIPITOR) 20 MG tablet TAKE 1 TABLET BY MOUTH EVERY DAY 07/16/23   Melvenia Manus BRAVO, MD  cyclobenzaprine  (FLEXERIL ) 10 MG tablet Take 1 tablet (10 mg total) by mouth 2 (two) times daily as needed. 09/08/23   Onesimo Oneil LABOR, MD  gabapentin  (NEURONTIN ) 300 MG capsule Take 1 capsule (300 mg total) by mouth 2 (two) times daily as needed (anxiety, insomnia). 09/19/23 12/18/23  Barbra Jayson LABOR, MD  ibuprofen  (ADVIL ) 200 MG tablet Take 400 mg by mouth every 6 (six) hours as needed for moderate pain.    [provider]  lamoTRIgine  (LAMICTAL ) 100 MG tablet Take 1 tablet (100 mg total) by mouth daily. 11/28/23 02/26/24  Barbra Jayson LABOR, MD  lamoTRIgine  (LAMICTAL ) 200 MG tablet Take 1 tablet (200 mg total) by mouth at bedtime. 11/28/23   Barbra Jayson LABOR, MD  pantoprazole  (PROTONIX ) 40 MG tablet TAKE 1 TABLET (40 MG TOTAL) BY MOUTH TWICE A DAY BEFORE MEALS 05/28/23   Kennedy Charmaine CROME, NP      Allergies    Penicillins, Dimetapp [albertsons di bromm], Promethazine hcl, and Wellbutrin  [bupropion ]    Review of Systems   Review  of Systems  Respiratory:  Positive for shortness of breath (resolved).   Cardiovascular:  Positive for chest pain.  All other systems reviewed and are negative.   Physical Exam Updated Vital Signs BP 113/82   Pulse 81   Temp (!) 97.5 F (36.4 C)   Resp 17   Ht 5' 8 (1.727 m)   Wt 92.1 kg   SpO2 98%   BMI 30.87 kg/m  Physical Exam Vitals and nursing note reviewed.  Constitutional:      General: He is not in acute distress.    Appearance: Normal appearance. He is well-developed and normal weight. He is not ill-appearing, toxic-appearing or diaphoretic.     Comments: Well appearing  HENT:      Head: Normocephalic and atraumatic.  Cardiovascular:     Rate and Rhythm: Normal rate and regular rhythm.     Pulses:          Radial pulses are 2+ on the right side and 2+ on the left side.     Heart sounds: Normal heart sounds.  Pulmonary:     Effort: Pulmonary effort is normal. No respiratory distress.     Breath sounds: Normal breath sounds.  Chest:     Chest wall: Tenderness present.     Comments: TTP left anterior chest without crepitus, deformity, or overlying skin changes Abdominal:     Palpations: Abdomen is soft.     Tenderness: There is no abdominal tenderness.  Musculoskeletal:        General: Normal range of motion.     Cervical back: Normal range of motion.     Right lower leg: No tenderness. No edema.     Left lower leg: No tenderness. No edema.  Skin:    General: Skin is warm and dry.  Neurological:     General: No focal deficit present.     Mental Status: He is alert.  Psychiatric:        Mood and Affect: Mood normal.        Behavior: Behavior normal.     ED Results / Procedures / Treatments   Labs (all labs ordered are listed, but only abnormal results are displayed) Labs Reviewed  BASIC METABOLIC PANEL - Abnormal; Notable for the following components:      Result Value   Glucose, Bld 100 (*)    All other components within normal limits  CBC  TROPONIN I (HIGH SENSITIVITY)  TROPONIN I (HIGH SENSITIVITY)    EKG EKG Interpretation Date/Time:  Thursday December 11 2023 10:19:19 EST Ventricular Rate:  84 PR Interval:  162 QRS Duration:  98 QT Interval:  346 QTC Calculation: 408 R Axis:   81  Text Interpretation: Normal sinus rhythm Possible Inferior infarct , age undetermined Abnormal ECG When compared with ECG of 06-Feb-2018 14:12, PREVIOUS ECG IS PRESENT Confirmed by Mannie Pac [phone]) on 12/11/2023 10:27:43 AM  Radiology DG Chest 2 View Result Date: 12/11/2023 CLINICAL DATA:  Chest pain and shortness of breath.  Left arm pain. EXAM:  CHEST - 2 VIEW COMPARISON:  Chest two views 02/06/2018 FINDINGS: Cardiac silhouette and mediastinal contours are within normal limits. Minimal lateral left midlung horizontal linear subsegmental atelectasis versus scarring. No acute airspace opacity to indicate pneumonia. No pleural effusion or pneumothorax. Mild dextrocurvature of the mid lower thoracic spine with mild-to-moderate multilevel disc space narrowing. IMPRESSION: Minimal lateral left midlung horizontal linear subsegmental atelectasis versus scarring. No acute airspace opacity to indicate pneumonia. Electronically Signed   By: Tanda  Viola M.D.   On: 12/11/2023 11:48    Procedures Procedures    Medications Ordered in ED Medications  lidocaine  (LIDODERM ) 5 % 1 patch (1 patch Transdermal Patch Applied 12/11/23 1417)  alum & mag hydroxide-simeth (MAALOX/MYLANTA) 200-200-20 MG/5ML suspension 30 mL (30 mLs Oral Given 12/11/23 1415)  methocarbamol  (ROBAXIN ) tablet 500 mg (500 mg Oral Given 12/11/23 1415)    ED Course/ Medical Decision Making/ A&P             HEART Score: 3                    Medical Decision Making Amount and/or Complexity of Data Reviewed Labs: ordered. Radiology: ordered.  Risk OTC drugs. Prescription drug management.   This patient is a 48 y.o. male who presents to the ED for concern of chest pain, this involves an extensive number of treatment options, and is a complaint that carries with it a high risk of complications and morbidity. The emergent differential diagnosis prior to evaluation includes, but is not limited to,  ACS, pericarditis, myocarditis, aortic dissection, PE, pneumothorax, esophageal spasm or rupture, chronic angina, pneumonia, bronchitis, GERD, reflux/PUD, biliary disease, pancreatitis, costochondritis, anxiety    This is not an exhaustive differential.   Past Medical History / Co-morbidities / Social History:  has a past medical history of Alcohol use disorder, moderate, in sustained  remission (HCC) (09/05/2022), Hyperlipidemia, Hypoglycemia, PONV (postoperative nausea and vomiting), and Stroke (HCC).  Additional history: Chart reviewed. Pertinent results include: no previous cardiology evaluation  Physical Exam: Physical exam performed. The pertinent findings include: Patient well-appearing, speaking in complete sentences, mild tenderness to palpation of the left anterior chest without overlying skin changes, crepitus, or deformity.  No other acute physical exam abnormalities.  Lung sounds clear to auscultation in all fields.  Lab Tests: I ordered, and personally interpreted labs.  The pertinent results include:  troponin 2 --> 3. No laboratory abnormalities   Imaging Studies: I ordered imaging studies including CXR. I independently visualized and interpreted imaging which showed   Minimal lateral left midlung horizontal linear subsegmental atelectasis versus scarring. No acute airspace opacity to indicate pneumonia.  I agree with the radiologist interpretation.   Cardiac Monitoring:  The patient was maintained on a cardiac monitor.  My attending physician Dr. Randol viewed and interpreted the cardiac monitored which showed an underlying rhythm of: sinus rhythm, no STEMI. I agree with this interpretation.   Medications: I ordered medication including GI cocktail, robaxin , lidocaine  patch  for pain. Reevaluation of the patient after these medicines showed that the patient improved. I have reviewed the patients home medicines and have made adjustments as needed.  Disposition: After consideration of the diagnostic results and the patients response to treatment, I feel that emergency department workup does not suggest an emergent condition requiring admission or immediate intervention beyond what has been performed at this time. The plan is: Discharge with cardiology referral for follow-up, close outpatient follow-up and return precautions.  Patient's workup is benign and  he is feeling significantly improved after time and medications given to him.  He is PERC negative.  He does have significant risk factors for cardiac disease and therefore I did send in a referral to cardiology follow-up following up.  His heart score is 3 and his troponins are negative. He feels well to return home. Evaluation and diagnostic testing in the emergency department does not suggest an emergent condition requiring admission or immediate intervention beyond what has been performed at this  time.  Plan for discharge with close PCP follow-up.  Patient is understanding and amenable with plan, educated on red flag symptoms that would prompt immediate return.  Patient discharged in stable condition.  Final Clinical Impression(s) / ED Diagnoses Final diagnoses:  Precordial chest pain  Tobacco use  Mixed hyperlipidemia  Hypertriglyceridemia    Rx / DC Orders ED Discharge Orders          Ordered    Ambulatory referral to Cardiology        12/11/23 1532          An After Visit Summary was printed and given to the patient.     Nora Lauraine DELENA DEVONNA 12/11/23 1544    Randol Simmonds, MD 12/12/23 [phone]

## 2023-12-11 NOTE — ED Notes (Signed)
 Awaiting patient from lobby.

## 2023-12-16 ENCOUNTER — Ambulatory Visit: Payer: BC Managed Care – PPO | Attending: Internal Medicine | Admitting: Internal Medicine

## 2023-12-16 VITALS — BP 118/68 | HR 77 | Ht 68.0 in | Wt 210.4 lb

## 2023-12-16 DIAGNOSIS — R072 Precordial pain: Secondary | ICD-10-CM | POA: Diagnosis not present

## 2023-12-16 DIAGNOSIS — E781 Pure hyperglyceridemia: Secondary | ICD-10-CM | POA: Diagnosis not present

## 2023-12-16 DIAGNOSIS — E782 Mixed hyperlipidemia: Secondary | ICD-10-CM | POA: Diagnosis not present

## 2023-12-16 MED ORDER — METOPROLOL TARTRATE 100 MG PO TABS: 100.0000 mg | ORAL_TABLET | Freq: Two times a day (BID) | ORAL | 0 refills | Status: DC

## 2023-12-16 MED ORDER — METOPROLOL TARTRATE 100 MG PO TABS: 100.0000 mg | ORAL_TABLET | Freq: Once | ORAL | 0 refills | Status: DC

## 2023-12-16 NOTE — Patient Instructions (Signed)
 Medication Instructions:  Your physician recommends that you continue on your current medications as directed. Please refer to the Current Medication list given to you today.  *If you need a refill on your cardiac medications before your next appointment, please call your pharmacy*   Lab Work: None (had BMP on 12/11/2023)    Testing/Procedures:   Your cardiac CT will be scheduled at one of the below locations:   [facility] [address] [place], [place] [address] [phone]   If scheduled at [facility], please arrive at the [facility] and Children's Entrance (Entrance C2) of [facility] 30 minutes prior to test start time. You can use the FREE valet parking offered at entrance C (encouraged to control the heart rate for the test)  Proceed to the [facility] Radiology Department (first floor) to check-in and test prep.  All radiology patients and guests should use entrance C2 at [facility], accessed from [facility], even though the hospital's physical address listed is [address]    Please follow these instructions carefully (unless otherwise directed):  An IV will be required for this test and Nitroglycerin will be given.  Hold all erectile dysfunction medications at least 3 days (72 hrs) prior to test. (Ie viagra, cialis, sildenafil, tadalafil, etc)   On the Night Before the Test: Be sure to Drink plenty of water. Do not consume any caffeinated/decaffeinated beverages or chocolate 12 hours prior to your test. Do not take any antihistamines 12 hours prior to your test.  On the Day of the Test: Drink plenty of water until 1 hour prior to the test. Do not eat any food 1 hour prior to test. You may take your regular medications prior to the test.  Take metoprolol (Lopressor) 100 mg, once, two hours prior to test. If you take Furosemide/Hydrochlorothiazide/Spironolactone/Chlorthalidone, please HOLD on the morning  of the test.       After the Test: Drink plenty of water. After receiving IV contrast, you may experience a mild flushed feeling. This is normal. On occasion, you may experience a mild rash up to 24 hours after the test. This is not dangerous. If this occurs, you can take Benadryl 25 mg, Zyrtec, Claritin, or Allegra and increase your fluid intake. (Patients taking Tikosyn should avoid Benadryl, and may take Zyrtec, Claritin, or Allegra) If you experience trouble breathing, this can be serious. If it is severe call 911 IMMEDIATELY. If it is mild, please call our office.  We will call to schedule your test 2-4 weeks out understanding that some insurance companies will need an authorization prior to the service being performed.   For more information and frequently asked questions, please visit our website : [email or web address]  For non-scheduling related questions, please contact the cardiac imaging nurse navigator should you have any questions/concerns: Cardiac Imaging Nurse Navigators Direct Office Dial: [phone]   For scheduling needs, including cancellations and rescheduling, please call [place], [phone].    Follow-Up: At [facility], you and your health needs are our priority.  As part of our continuing mission to provide you with exceptional heart care, we have created designated Provider Care Teams.  These Care Teams include your primary Cardiologist (physician) and Advanced Practice Providers (APPs -  Physician Assistants and Nurse Practitioners) who all work together to provide you with the care you need, when you need it.   Your next appointment:    As needed, depending on the results of your Cardiac  CT  Provider:   Dr. Carolan Clines

## 2023-12-16 NOTE — Progress Notes (Signed)
Cardiology Office Note:  .   Date:  12/16/2023  ID:  Andrew Reach Sr., DOB [birth year], MRN [id number] PCP: Billie Lade, MD  [facility] Providers Cardiologist:  None    History of Present Illness: .   Andrew Donning Capri Sr. is a 48 y.o. male with history of alcohol abuse, tobacco abuse who is being referred to clinic for chest pain. He was having persistent left-sided pain throughout the day (see ED note 12/11/2023).  He was managed with a lidocaine patch and Maalox.  His troponin was negative.  His EKG showed sinus rhythm with no acute ischemic changes.  ACS was ruled out.  He was then referred to see cardiology outpatient.  He has smoked for 1 pack/day for least 24 years.  He has a first-degree relative who had an MI around the age of 19  Today, he presents with some ongoing chest pain. With his initial episode, he describes  a 'tightened feeling' and 'like an electrical shock,' that began on Wednesday and persisted into the following day. The pain was exacerbated by physical exertion at work, causing the patient to experience difficulty breathing. The pain radiated to the neck and shoulder. The patient sought immediate medical attention in the emergency room, where tests ruled out an active heart attack. Since the ER visit, the patient has experienced a minor episode of chest pain but continues to maintain his regular activity level.       ROS:  per HPI otherwise negative   Studies Reviewed: Marland Kitchen   EKG Interpretation Date/Time:  Tuesday December 16 2023 09:03:14 EST Ventricular Rate:  75 PR Interval:  158 QRS Duration:  92 QT Interval:  366 QTC Calculation: 408 R Axis:   49  Text Interpretation: Normal sinus rhythm Normal ECG When compared with ECG of 11-Dec-2023 10:19, No significant change was found Confirmed by Carolan Clines ([id number]) on 12/16/2023 9:06:15 AM    EKG Interpretation Date/Time:  Tuesday December 16 2023 09:03:14 EST Ventricular Rate:  75 PR Interval:  158 QRS  Duration:  92 QT Interval:  366 QTC Calculation: 408 R Axis:   49  Text Interpretation: Normal sinus rhythm Normal ECG When compared with ECG of 11-Dec-2023 10:19, No significant change was found Confirmed by Carolan Clines ([id number]) on 12/16/2023 9:06:15 AM  Risk Assessment/Calculations:        Physical Exam:   VS:  Vitals:   12/16/23 0907  BP: 118/68  Pulse: 77  SpO2: 95%    Wt Readings from Last 3 Encounters:  12/16/23 210 lb 6.4 oz (95.4 kg)  12/11/23 203 lb (92.1 kg)  10/07/23 208 lb 6.4 oz (94.5 kg)    GEN: Well nourished, well developed in no acute distress NECK: No JVD CARDIAC: RRR, no murmurs, rubs, gallops RESPIRATORY:  Clear to auscultation without rales, wheezing or rhonchi  ABDOMEN: Soft, non-tender, non-distended EXTREMITIES:  No edema; No deformity   ASSESSMENT AND PLAN: .   Possible Cardiac Chest Pain New onset chest pain with radiation to neck and shoulder. No active myocardial infarction per ER evaluation. Noted to have significant family history of early onset heart disease. Current smoker. -Schedule coronary CT to evaluate for coronary artery disease. -Advise patient to avoid strenuous activity until test results are available.  Tobacco Use Current smoker with significant family history of heart disease. -Encourage smoking cessation to reduce risk of heart disease and stroke.      LDL 80 mg/dL, TC 086 mg/dL, TG 578 mg/dL  Dispo: Follow up PRN  Signed, Maisie Fus, MD

## 2023-12-18 ENCOUNTER — Encounter: Payer: Self-pay | Admitting: Internal Medicine

## 2023-12-25 NOTE — Progress Notes (Signed)
 Order(s) created erroneously. Erroneous order ID: [id number]  Order moved by: CHART CORRECTION ANALYST, FIFTEEN  Order move date/time: 12/25/2023 4:33 PM  Source Patient: [id number]  Source Contact: 12/16/2023  Destination Patient: [id number]  Destination Contact: 04/16/2023

## 2023-12-27 ENCOUNTER — Other Ambulatory Visit: Payer: Self-pay | Admitting: Internal Medicine

## 2023-12-27 DIAGNOSIS — E559 Vitamin D deficiency, unspecified: Secondary | ICD-10-CM

## 2023-12-29 ENCOUNTER — Encounter (HOSPITAL_COMMUNITY): Payer: Self-pay

## 2023-12-31 ENCOUNTER — Encounter: Payer: Self-pay | Admitting: Internal Medicine

## 2023-12-31 ENCOUNTER — Ambulatory Visit (HOSPITAL_COMMUNITY)
Admission: RE | Admit: 2023-12-31 | Discharge: 2023-12-31 | Disposition: A | Discharge status: Home / Self Care | Payer: BC Managed Care – PPO | Source: Ambulatory Visit | Attending: Internal Medicine | Admitting: Internal Medicine

## 2023-12-31 DIAGNOSIS — R072 Precordial pain: Secondary | ICD-10-CM

## 2023-12-31 MED ORDER — METOPROLOL TARTRATE 5 MG/5ML IV SOLN: 10.0000 mg | Freq: Once | INTRAVENOUS | Status: DC | PRN

## 2023-12-31 MED ORDER — NITROGLYCERIN 0.4 MG SL SUBL
0.8000 mg | SUBLINGUAL_TABLET | Freq: Once | SUBLINGUAL | Status: AC
  Administered 2023-12-31: 0.8 mg via SUBLINGUAL

## 2023-12-31 MED ORDER — NITROGLYCERIN 0.4 MG SL SUBL
SUBLINGUAL_TABLET | SUBLINGUAL | Status: AC
Start: 2023-12-31 — End: ?
  Filled 2023-12-31: qty 2

## 2023-12-31 MED ORDER — DILTIAZEM HCL 25 MG/5ML IV SOLN: 10.0000 mg | INTRAVENOUS | Status: DC | PRN

## 2023-12-31 MED ORDER — SODIUM CHLORIDE 0.9 % IV BOLUS
250.0000 mL | Freq: Once | INTRAVENOUS | Status: AC
  Administered 2023-12-31: 250 mL via INTRAVENOUS

## 2023-12-31 MED ORDER — IOHEXOL 350 MG/ML SOLN
95.0000 mL | Freq: Once | INTRAVENOUS | Status: AC | PRN
  Administered 2023-12-31: 95 mL via INTRAVENOUS

## 2023-12-31 MED ORDER — NITROGLYCERIN 0.4 MG SL SUBL: 0.8000 mg | SUBLINGUAL_TABLET | Freq: Once | SUBLINGUAL | Status: DC

## 2023-12-31 NOTE — Progress Notes (Signed)
 Unable to reach Dr Ronette Deter. Spoke with Christeen Douglas RN Ct heart navigator and then with Dr Jacques Navy who gave orders for fluids. NS 250cc given.

## 2023-12-31 NOTE — Progress Notes (Signed)
 Spoke with Dr Ronette Deter about patient BP 99/71 and heart rate 67-80. Ordered 3rd bolus of NS 250 to be given and then go ahead and give nitroglycerine and scan patient despite what his heart rate is.

## 2024-01-23 ENCOUNTER — Encounter (HOSPITAL_COMMUNITY): Payer: Self-pay | Admitting: Psychiatry

## 2024-01-23 ENCOUNTER — Telehealth (HOSPITAL_COMMUNITY): Payer: BC Managed Care – PPO | Admitting: Psychiatry

## 2024-01-23 DIAGNOSIS — Z720 Tobacco use: Secondary | ICD-10-CM

## 2024-01-23 DIAGNOSIS — F3181 Bipolar II disorder: Secondary | ICD-10-CM

## 2024-01-23 DIAGNOSIS — F410 Panic disorder [episodic paroxysmal anxiety] without agoraphobia: Secondary | ICD-10-CM

## 2024-01-23 DIAGNOSIS — E559 Vitamin D deficiency, unspecified: Secondary | ICD-10-CM

## 2024-01-23 DIAGNOSIS — F411 Generalized anxiety disorder: Secondary | ICD-10-CM | POA: Diagnosis not present

## 2024-01-23 DIAGNOSIS — F5104 Psychophysiologic insomnia: Secondary | ICD-10-CM

## 2024-01-23 DIAGNOSIS — F4310 Post-traumatic stress disorder, unspecified: Secondary | ICD-10-CM | POA: Diagnosis not present

## 2024-01-23 DIAGNOSIS — Z79899 Other long term (current) drug therapy: Secondary | ICD-10-CM

## 2024-01-23 MED ORDER — LAMOTRIGINE 200 MG PO TABS: 200.0000 mg | ORAL_TABLET | Freq: Every evening | ORAL | 0 refills | Status: DC

## 2024-01-23 MED ORDER — LAMOTRIGINE 100 MG PO TABS: 100.0000 mg | ORAL_TABLET | Freq: Every day | ORAL | 0 refills | Status: DC

## 2024-01-23 MED ORDER — GABAPENTIN 300 MG PO CAPS: 300.0000 mg | ORAL_CAPSULE | Freq: Two times a day (BID) | ORAL | 0 refills | Status: AC | PRN
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
## 2024-01-23 NOTE — Patient Instructions (Signed)
 We did not make any medication changes today.  Your antipsychotic monitoring labs will also be not needing to be checked again until December 2025.  Happy birthday and I hope it is a good one!

## 2024-02-27 ENCOUNTER — Other Ambulatory Visit (HOSPITAL_COMMUNITY): Payer: Self-pay | Admitting: Psychiatry

## 2024-02-27 DIAGNOSIS — F3181 Bipolar II disorder: Secondary | ICD-10-CM

## 2024-03-19 ENCOUNTER — Encounter (HOSPITAL_COMMUNITY): Payer: Self-pay | Admitting: Psychiatry

## 2024-03-19 ENCOUNTER — Telehealth (HOSPITAL_COMMUNITY): Admitting: Psychiatry

## 2024-03-19 DIAGNOSIS — F4310 Post-traumatic stress disorder, unspecified: Secondary | ICD-10-CM | POA: Diagnosis not present

## 2024-03-19 DIAGNOSIS — F3181 Bipolar II disorder: Secondary | ICD-10-CM

## 2024-03-19 DIAGNOSIS — F5104 Psychophysiologic insomnia: Secondary | ICD-10-CM

## 2024-03-19 DIAGNOSIS — Z720 Tobacco use: Secondary | ICD-10-CM

## 2024-03-19 DIAGNOSIS — Z79899 Other long term (current) drug therapy: Secondary | ICD-10-CM

## 2024-03-19 DIAGNOSIS — F17210 Nicotine dependence, cigarettes, uncomplicated: Secondary | ICD-10-CM

## 2024-03-19 MED ORDER — LAMOTRIGINE 100 MG PO TABS
100.0000 mg | ORAL_TABLET | Freq: Every day | ORAL | 1 refills | Status: AC
  Filled 2024-08-12: qty 90, 90d supply, fill #0

## 2024-03-19 MED ORDER — LAMOTRIGINE 200 MG PO TABS: 200.0000 mg | ORAL_TABLET | Freq: Every evening | ORAL | 1 refills | Status: AC

## 2024-03-19 NOTE — Patient Instructions (Signed)
 We did not make any medication changes today.  It is okay to take a second dose of gabapentin  to assist with sleep if you are still waking up after about 3 to 4 hours during the night.  Keep trying to make cut back to cigarettes and utilize the resources from the quit Line to assist with this.
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
## 2024-04-06 ENCOUNTER — Ambulatory Visit (INDEPENDENT_AMBULATORY_CARE_PROVIDER_SITE_OTHER): Payer: BC Managed Care – PPO | Admitting: Internal Medicine

## 2024-04-06 ENCOUNTER — Encounter: Payer: Self-pay | Admitting: Internal Medicine

## 2024-04-06 VITALS — BP 117/78 | HR 97 | Ht 68.0 in | Wt 213.8 lb

## 2024-04-06 DIAGNOSIS — K219 Gastro-esophageal reflux disease without esophagitis: Secondary | ICD-10-CM

## 2024-04-06 DIAGNOSIS — R7303 Prediabetes: Secondary | ICD-10-CM | POA: Insufficient documentation

## 2024-04-06 DIAGNOSIS — E559 Vitamin D deficiency, unspecified: Secondary | ICD-10-CM

## 2024-04-06 DIAGNOSIS — F3181 Bipolar II disorder: Secondary | ICD-10-CM | POA: Diagnosis not present

## 2024-04-06 DIAGNOSIS — E782 Mixed hyperlipidemia: Secondary | ICD-10-CM

## 2024-04-06 DIAGNOSIS — Z720 Tobacco use: Secondary | ICD-10-CM

## 2024-04-06 NOTE — Progress Notes (Signed)
 Established Patient Office Visit  Subjective   Patient ID: Andrew Kazlauskas Sr., male    DOB: 1975/12/06  Age: 48 y.o. MRN: [id number]  Chief Complaint  Patient presents with   Care Management    Six month follow up    Andrew Gillespie returns to care today for routine follow-up.  He was last evaluated by me in December 2024 for his annual physical.  No medication changes were made at that time, repeat labs ordered, and 56-month follow-up arranged.  In the interim, he has been seen by psychiatry for follow-up.  ER presentation on 2/6 endorsing chest pain.  Fortunately, his cardiac workup was reassuring and he was referred to cardiology for further evaluation.  Seen by cardiology 2/11.  Coronary CT ordered, coronary calcium  score 0.  No evidence of CAD noted.  There have otherwise been no acute interval events. Mr. Bramel reports feeling well today.  He is asymptomatic and has no acute concerns to discuss.  Past Medical History:  Diagnosis Date   Alcohol use disorder, moderate, in sustained remission (HCC) 09/05/2022   Hyperlipidemia    Hypoglycemia    PONV (postoperative nausea and vomiting)    Stroke [facility])    mini stroke 2008   Past Surgical History:  Procedure Laterality Date   APPENDECTOMY     BALLOON DILATION N/A 10/15/2022   Procedure: BALLOON DILATION;  Surgeon: Vinetta Greening, DO;  Location: [facility];  Service: Endoscopy;  Laterality: N/A;   BIOPSY  10/15/2022   Procedure: BIOPSY;  Surgeon: Vinetta Greening, DO;  Location: [facility];  Service: Endoscopy;;   COLONOSCOPY WITH PROPOFOL  N/A 10/15/2022   Procedure: COLONOSCOPY WITH PROPOFOL ;  Surgeon: Vinetta Greening, DO;  Location: [facility];  Service: Endoscopy;  Laterality: N/A;  9:00 am   ESOPHAGOGASTRODUODENOSCOPY (EGD) WITH PROPOFOL  N/A 10/15/2022   Procedure: ESOPHAGOGASTRODUODENOSCOPY (EGD) WITH PROPOFOL ;  Surgeon: Vinetta Greening, DO;  Location: [facility];  Service: Endoscopy;  Laterality: N/A;   HERNIA  REPAIR     POLYPECTOMY  10/15/2022   Procedure: POLYPECTOMY;  Surgeon: Vinetta Greening, DO;  Location: [facility];  Service: Endoscopy;;   SHOULDER ARTHROSCOPY WITH ROTATOR CUFF REPAIR Left 08/05/2022   Procedure: SHOULDER ARTHROSCOPY WITH DEBRIDEMENT AND BICEPS TENODESIS;  Surgeon: Tonita Frater, MD;  Location: [facility];  Service: Orthopedics;  Laterality: Left;   Social History   Tobacco Use   Smoking status: Every Day    Current packs/day: 0.50    Average packs/day: 0.5 packs/day for 35.4 years (17.7 ttl pk-yrs)    Types: Cigarettes    Start date: 11/04/1988   Smokeless tobacco: Former    Quit date: 11/13/2012   Tobacco comments:    Roughly half a pack to three quarters of a pack daily  Vaping Use   Vaping status: Never Used  Substance Use Topics   Alcohol use: Not Currently    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   Drug use: No   Family History  Problem Relation Age of Onset   Diabetes Mother    Mental illness Mother    Diabetes Father    Heart attack Father    Mental illness Father    Colon polyps Father        frequent colonoscopies, every 2 years   Diabetes Sister    Mental illness Brother    Diabetes Maternal Grandmother    Stroke Maternal Grandmother    Diabetes Maternal Grandfather  Heart attack Maternal Grandfather    Congestive Heart Failure Maternal Grandfather    Heart attack Paternal Grandmother    Heart attack Paternal Grandfather    Colon cancer Neg Hx    Allergies  Allergen Reactions   Penicillins Anaphylaxis and Hives    Has patient had a PCN reaction causing immediate rash, facial/tongue/throat swelling, SOB or lightheadedness with hypotension: yes Has patient had a PCN reaction causing severe rash involving mucus membranes or skin necrosis: yes Has patient had a PCN reaction that required hospitalization: unknown Has patient had a PCN reaction occurring within the last 10 years: No If all of the above answers are "NO", then may proceed with  Cephalosporin use.    Dimetapp [[employer] Di Bromm] Other (See Comments)    Child hood allergy   Promethazine Hcl Other (See Comments)    paranoid   Wellbutrin  [Bupropion ] Anxiety   Review of Systems  Constitutional:  Negative for chills and fever.  HENT:  Negative for sore throat.   Respiratory:  Negative for cough and shortness of breath.   Cardiovascular:  Negative for chest pain, palpitations and leg swelling.  Gastrointestinal:  Negative for abdominal pain, blood in stool, constipation, diarrhea, nausea and vomiting.  Genitourinary:  Negative for dysuria and hematuria.  Musculoskeletal:  Negative for myalgias.  Skin:  Negative for itching and rash.  Neurological:  Negative for dizziness and headaches.  Psychiatric/Behavioral:  Negative for depression and suicidal ideas.      Objective:     BP 117/78   Pulse 97   Ht 5\' 8"  (1.727 m)   Wt 213 lb 12.8 oz (97 kg)   SpO2 94%   BMI 32.51 kg/m  BP Readings from Last 3 Encounters:  04/06/24 117/78  12/31/23 100/72  12/16/23 118/68   Physical Exam Vitals reviewed.  Constitutional:      General: He is not in acute distress.    Appearance: Normal appearance. He is obese. He is not ill-appearing.  HENT:     Head: Normocephalic and atraumatic.     Right Ear: External ear normal.     Left Ear: External ear normal.     Nose: Nose normal. No congestion or rhinorrhea.     Mouth/Throat:     Mouth: Mucous membranes are moist.     Pharynx: Oropharynx is clear.  Eyes:     General: No scleral icterus.    Extraocular Movements: Extraocular movements intact.     Conjunctiva/sclera: Conjunctivae normal.     Pupils: Pupils are equal, round, and reactive to light.  Cardiovascular:     Rate and Rhythm: Normal rate and regular rhythm.     Pulses: Normal pulses.     Heart sounds: Normal heart sounds. No murmur heard. Pulmonary:     Effort: Pulmonary effort is normal.     Breath sounds: Normal breath sounds. No wheezing, rhonchi or  rales.  Abdominal:     General: Abdomen is flat. Bowel sounds are normal. There is no distension.     Palpations: Abdomen is soft.     Tenderness: There is no abdominal tenderness.  Musculoskeletal:        General: No swelling or deformity. Normal range of motion.     Cervical back: Normal range of motion.  Skin:    General: Skin is warm and dry.     Capillary Refill: Capillary refill takes less than 2 seconds.  Neurological:     General: No focal deficit present.     Mental  Status: He is alert and oriented to person, place, and time.     Motor: No weakness.  Psychiatric:        Mood and Affect: Mood normal.        Behavior: Behavior normal.        Thought Content: Thought content normal.   Last CBC Lab Results  Component Value Date   WBC 9.9 12/11/2023   HGB 15.5 12/11/2023   HCT 46.0 12/11/2023   MCV 92.0 12/11/2023   MCH 31.0 12/11/2023   RDW 12.9 12/11/2023   PLT 244 12/11/2023   Last metabolic panel Lab Results  Component Value Date   GLUCOSE 100 (H) 12/11/2023   NA 139 12/11/2023   K 4.0 12/11/2023   CL 105 12/11/2023   CO2 25 12/11/2023   BUN 11 12/11/2023   CREATININE 0.92 12/11/2023   GFRNONAA >60 12/11/2023   CALCIUM  9.3 12/11/2023   PROT 6.8 10/07/2023   ALBUMIN 4.5 10/07/2023   LABGLOB 2.3 10/07/2023   AGRATIO 2.0 10/11/2022   BILITOT 0.3 10/07/2023   ALKPHOS 122 (H) 10/07/2023   AST 18 10/07/2023   ALT 26 10/07/2023   ANIONGAP 9 12/11/2023   Last lipids Lab Results  Component Value Date   CHOL 160 10/07/2023   HDL 47 10/07/2023   LDLCALC 80 10/07/2023   TRIG 197 (H) 10/07/2023   CHOLHDL 3.4 10/07/2023   Last hemoglobin A1c Lab Results  Component Value Date   HGBA1C 5.7 (H) 10/07/2023   Last thyroid functions Lab Results  Component Value Date   TSH 1.630 10/07/2023   Last vitamin D  Lab Results  Component Value Date   VD25OH 10.0 (L) 10/07/2023   Last vitamin B12 and Folate Lab Results  Component Value Date   VITAMINB12 447  10/07/2023   FOLATE 9.6 10/07/2023     Assessment & Plan:   Problem List Items Addressed This Visit       Esophageal reflux   Symptoms remain well-controlled with pantoprazole  40 mg twice daily.      Bipolar 2 disorder, major depressive episode (HCC) (Chronic)   Closely followed by psychiatry.  Mood remains stable on regimen of Lamictal , Abilify , and gabapentin .      Tobacco use   He has cut back to 3/4 pack/day of cigarettes and states that he is gradually working toward cessation.  We discussed nicotine replacement therapy but he declines for now. -The patient was counseled on the dangers of tobacco use, and was advised to quit.  Reviewed strategies to maximize success, including removing cigarettes and smoking materials from environment, stress management, substitution of other forms of reinforcement, support of family/friends, and written materials.       Hyperlipidemia   Lipid panel updated in December 2024 reflects adequate control with atorvastatin  20 mg daily.      Vitamin D  deficiency - Primary   Noted on labs from December 2024.  He completed high-dose, weekly vitamin D  supplementation and then started a daily vitamin D  supplement.  Repeat vitamin D  level ordered today.      Prediabetes   A1c 5.7 on labs from December.  We discussed the need to make significant dietary changes in an effort to prevent progression to diabetes.      Return in about 6 months (around 10/06/2024) for CPE.   Tobi Fortes, MD

## 2024-04-06 NOTE — Assessment & Plan Note (Addendum)
 Noted on labs from December 2024.  He completed high-dose, weekly vitamin D  supplementation and then started a daily vitamin D  supplement.  Repeat vitamin D  level ordered today.

## 2024-04-06 NOTE — Assessment & Plan Note (Signed)
 He has cut back to 3/4 pack/day of cigarettes and states that he is gradually working toward cessation.  We discussed nicotine replacement therapy but he declines for now. -The patient was counseled on the dangers of tobacco use, and was advised to quit.  Reviewed strategies to maximize success, including removing cigarettes and smoking materials from environment, stress management, substitution of other forms of reinforcement, support of family/friends, and written materials.

## 2024-04-06 NOTE — Assessment & Plan Note (Signed)
 Closely followed by psychiatry.  Mood remains stable on regimen of Lamictal , Abilify , and gabapentin .

## 2024-04-06 NOTE — Assessment & Plan Note (Signed)
 Symptoms remain well-controlled with pantoprazole 40 mg twice daily

## 2024-04-06 NOTE — Patient Instructions (Signed)
It was a pleasure to see you today.  Thank you for giving [place] the opportunity to be involved in your care.  Below is a brief recap of your visit and next steps.  We will plan to see you again in 6 months.  Summary No medication changes today Repeat vitamin D level ordered Follow up in 6 months

## 2024-04-06 NOTE — Assessment & Plan Note (Signed)
 A1c 5.7 on labs from December.  We discussed the need to make significant dietary changes in an effort to prevent progression to diabetes.

## 2024-04-06 NOTE — Assessment & Plan Note (Signed)
 Lipid panel updated in December 2024 reflects adequate control with atorvastatin  20 mg daily.

## 2024-04-07 ENCOUNTER — Ambulatory Visit: Payer: Self-pay | Admitting: Internal Medicine

## 2024-04-07 LAB — VITAMIN D 25 HYDROXY (VIT D DEFICIENCY, FRACTURES): Vit D, 25-Hydroxy: 31.7 ng/mL (ref 30.0–100.0)

## 2024-05-26 ENCOUNTER — Other Ambulatory Visit: Payer: Self-pay | Admitting: Gastroenterology

## 2024-06-25 ENCOUNTER — Encounter: Payer: Self-pay | Admitting: Radiology

## 2024-07-12 ENCOUNTER — Other Ambulatory Visit (HOSPITAL_BASED_OUTPATIENT_CLINIC_OR_DEPARTMENT_OTHER): Payer: Self-pay

## 2024-07-12 DIAGNOSIS — F17209 Nicotine dependence, unspecified, with unspecified nicotine-induced disorders: Secondary | ICD-10-CM | POA: Diagnosis not present

## 2024-07-12 DIAGNOSIS — F3181 Bipolar II disorder: Secondary | ICD-10-CM | POA: Diagnosis not present

## 2024-07-12 DIAGNOSIS — F411 Generalized anxiety disorder: Secondary | ICD-10-CM | POA: Diagnosis not present

## 2024-07-12 DIAGNOSIS — F4312 Post-traumatic stress disorder, chronic: Secondary | ICD-10-CM | POA: Diagnosis not present

## 2024-07-12 MED ORDER — GABAPENTIN 300 MG PO CAPS
300.0000 mg | ORAL_CAPSULE | Freq: Two times a day (BID) | ORAL | 2 refills | Status: AC
  Filled 2024-07-12: qty 60, 30d supply, fill #0
  Filled 2024-11-10: qty 60, 30d supply, fill #1

## 2024-07-12 MED ORDER — LAMOTRIGINE 100 MG PO TABS
100.0000 mg | ORAL_TABLET | Freq: Every morning | ORAL | 2 refills | Status: AC
  Filled 2024-11-10: qty 30, 30d supply, fill #0

## 2024-07-12 MED ORDER — LAMOTRIGINE 100 MG PO TABS
100.0000 mg | ORAL_TABLET | Freq: Every day | ORAL | 0 refills | Status: AC
  Filled 2024-11-10: qty 90, 90d supply, fill #0

## 2024-07-12 MED ORDER — GABAPENTIN 600 MG PO TABS
600.0000 mg | ORAL_TABLET | Freq: Two times a day (BID) | ORAL | 2 refills | Status: DC
  Filled 2024-07-12: qty 60, 30d supply, fill #0

## 2024-07-12 MED ORDER — ARIPIPRAZOLE 2 MG PO TABS
2.0000 mg | ORAL_TABLET | Freq: Every day | ORAL | 2 refills | Status: AC
  Filled 2024-11-10: qty 30, 30d supply, fill #0

## 2024-07-12 MED ORDER — LAMOTRIGINE 200 MG PO TABS
200.0000 mg | ORAL_TABLET | Freq: Every day | ORAL | 2 refills | Status: AC
  Filled 2024-11-10: qty 30, 30d supply, fill #0

## 2024-07-12 MED ORDER — LAMOTRIGINE 200 MG PO TABS
200.0000 mg | ORAL_TABLET | Freq: Every day | ORAL | 0 refills | Status: AC
  Filled 2024-08-24 (×2): qty 90, 90d supply, fill #0

## 2024-07-12 MED ORDER — ONDANSETRON 4 MG PO TBDP
4.0000 mg | ORAL_TABLET | Freq: Four times a day (QID) | ORAL | 0 refills | Status: AC
  Filled 2024-11-10: qty 12, 8d supply, fill #0

## 2024-07-12 MED ORDER — VITAMIN D (ERGOCALCIFEROL) 1.25 MG (50000 UNIT) PO CAPS: 50000.0000 [IU] | ORAL_CAPSULE | ORAL | 0 refills | Status: AC

## 2024-07-19 ENCOUNTER — Other Ambulatory Visit (HOSPITAL_BASED_OUTPATIENT_CLINIC_OR_DEPARTMENT_OTHER): Payer: Self-pay

## 2024-07-26 ENCOUNTER — Other Ambulatory Visit: Payer: Self-pay

## 2024-07-26 DIAGNOSIS — F17209 Nicotine dependence, unspecified, with unspecified nicotine-induced disorders: Secondary | ICD-10-CM | POA: Diagnosis not present

## 2024-07-26 DIAGNOSIS — F4312 Post-traumatic stress disorder, chronic: Secondary | ICD-10-CM | POA: Diagnosis not present

## 2024-07-26 DIAGNOSIS — E785 Hyperlipidemia, unspecified: Secondary | ICD-10-CM

## 2024-07-26 DIAGNOSIS — F3181 Bipolar II disorder: Secondary | ICD-10-CM | POA: Diagnosis not present

## 2024-07-26 DIAGNOSIS — F411 Generalized anxiety disorder: Secondary | ICD-10-CM | POA: Diagnosis not present

## 2024-07-26 MED ORDER — ATORVASTATIN CALCIUM 20 MG PO TABS: 20.0000 mg | ORAL_TABLET | Freq: Every day | ORAL | 0 refills | Status: DC

## 2024-08-09 DIAGNOSIS — F3181 Bipolar II disorder: Secondary | ICD-10-CM | POA: Diagnosis not present

## 2024-08-09 DIAGNOSIS — F17209 Nicotine dependence, unspecified, with unspecified nicotine-induced disorders: Secondary | ICD-10-CM | POA: Diagnosis not present

## 2024-08-09 DIAGNOSIS — F4312 Post-traumatic stress disorder, chronic: Secondary | ICD-10-CM | POA: Diagnosis not present

## 2024-08-09 DIAGNOSIS — F411 Generalized anxiety disorder: Secondary | ICD-10-CM | POA: Diagnosis not present

## 2024-08-12 ENCOUNTER — Other Ambulatory Visit (HOSPITAL_BASED_OUTPATIENT_CLINIC_OR_DEPARTMENT_OTHER): Payer: Self-pay

## 2024-08-12 MED FILL — Aripiprazole Tab 2 MG: ORAL | 90 days supply | Qty: 90 | Fill #0 | Status: AC

## 2024-08-13 ENCOUNTER — Other Ambulatory Visit (HOSPITAL_BASED_OUTPATIENT_CLINIC_OR_DEPARTMENT_OTHER): Payer: Self-pay

## 2024-08-24 ENCOUNTER — Other Ambulatory Visit: Payer: Self-pay | Admitting: Internal Medicine

## 2024-08-24 ENCOUNTER — Other Ambulatory Visit (HOSPITAL_BASED_OUTPATIENT_CLINIC_OR_DEPARTMENT_OTHER): Payer: Self-pay

## 2024-08-24 ENCOUNTER — Other Ambulatory Visit: Payer: Self-pay

## 2024-08-24 DIAGNOSIS — E785 Hyperlipidemia, unspecified: Secondary | ICD-10-CM

## 2024-08-24 MED FILL — Pantoprazole Sodium EC Tab 40 MG (Base Equiv): ORAL | 90 days supply | Qty: 180 | Fill #0 | Status: AC

## 2024-08-25 ENCOUNTER — Other Ambulatory Visit (HOSPITAL_BASED_OUTPATIENT_CLINIC_OR_DEPARTMENT_OTHER): Payer: Self-pay

## 2024-08-25 MED ORDER — ATORVASTATIN CALCIUM 20 MG PO TABS
20.0000 mg | ORAL_TABLET | Freq: Every day | ORAL | 3 refills | Status: AC
  Filled 2024-08-25: qty 90, 90d supply, fill #0
  Filled 2024-11-28: qty 90, 90d supply, fill #1

## 2024-09-06 ENCOUNTER — Encounter: Payer: Self-pay | Admitting: Radiology

## 2024-10-12 ENCOUNTER — Ambulatory Visit

## 2024-10-12 VITALS — BP 116/80 | HR 104 | Ht 68.0 in | Wt 225.0 lb

## 2024-10-12 DIAGNOSIS — Z0000 Encounter for general adult medical examination without abnormal findings: Secondary | ICD-10-CM | POA: Diagnosis not present

## 2024-10-12 NOTE — Progress Notes (Unsigned)
 Complete physical exam  Patient: Andrew Frankie Gillespie.   DOB: [birth year]   48 y.o. Male  MRN: [id number]  Subjective:    Chief Complaint  Patient presents with   Annual Exam    Andrew Gillespie. is a 48 y.o. male who presents today for a complete physical exam. He reports consuming a {diet types:17450} diet. {types:19826} He generally feels {DESC; WELL/FAIRLY WELL/POORLY:18703}. He reports sleeping {DESC; WELL/FAIRLY WELL/POORLY:18703}. He {does/does not:200015} have additional problems to discuss today.    Most recent fall risk assessment:    10/07/2023    8:56 AM  Fall Risk   Falls in the past year? 0  Number falls in past yr: 0  Injury with Fall? 0   Risk for fall due to : No Fall Risks  Follow up Falls evaluation completed     Data saved with a previous flowsheet row definition     Most recent depression screenings:    10/12/2024   10:28 AM 04/06/2024    9:29 AM  PHQ 2/9 Scores  PHQ - 2 Score 2 2  PHQ- 9 Score 6 8      Data saved with a previous flowsheet row definition    {VISON DENTAL STD PSA (Optional):27386}  {History (Optional):23778}  Patient Care Team: Bevely Doffing, FNP as PCP - General (Family Medicine) Alvan Ronal BRAVO, MD (Inactive) as PCP - Cardiology (Cardiology)   Outpatient Medications Prior to Visit  Medication Sig   acetaminophen  (TYLENOL ) 500 MG tablet Take 1,000 mg by mouth every 6 (six) hours as needed for moderate pain.   ARIPiprazole  (ABILIFY ) 2 MG tablet Take 1 tablet (2 mg total) by mouth daily.   ARIPiprazole  (ABILIFY ) 2 MG tablet Take 1 tablet (2 mg total) by mouth at bedtime.   aspirin EC 81 MG tablet Take 81 mg by mouth 2 (two) times a week. Swallow whole.   atorvastatin  (LIPITOR) 20 MG tablet TAKE 1 TABLET BY MOUTH EVERY DAY   atorvastatin  (LIPITOR) 20 MG tablet Take 1 tablet (20 mg total) by mouth daily.   cyclobenzaprine  (FLEXERIL ) 10 MG tablet Take 1 tablet (10 mg total) by mouth 2 (two) times daily as needed.   gabapentin   (NEURONTIN ) 300 MG capsule Take 1 capsule (300 mg total) by mouth 2 (two) times daily as needed (anxiety, insomnia).   gabapentin  (NEURONTIN ) 300 MG capsule Take 1 capsule (300 mg total) by mouth 2 (two) times daily as needed for anxiety/insomnia.   ibuprofen  (ADVIL ) 200 MG tablet Take 400 mg by mouth every 6 (six) hours as needed for moderate pain.   lamoTRIgine  (LAMICTAL ) 100 MG tablet Take 1 tablet (100 mg total) by mouth daily.   lamoTRIgine  (LAMICTAL ) 100 MG tablet Take 1 tablet (100 mg total) by mouth daily.   lamoTRIgine  (LAMICTAL ) 100 MG tablet Take 1 tablet (100 mg total) by mouth in the morning.   lamoTRIgine  (LAMICTAL ) 200 MG tablet Take 1 tablet (200 mg total) by mouth at bedtime.   lamoTRIgine  (LAMICTAL ) 200 MG tablet Take 1 tablet (200 mg total) by mouth at bedtime.   lamoTRIgine  (LAMICTAL ) 200 MG tablet Take 1 tablet (200 mg total) by mouth at bedtime.   ondansetron  (ZOFRAN -ODT) 4 MG disintegrating tablet Take 1 tablet (4 mg total) by mouth every 6 (six) hours.   pantoprazole  (PROTONIX ) 40 MG tablet Take 1 tablet (40 mg total) by mouth 2 (two) times daily before meals   Vitamin D , Ergocalciferol , (DRISDOL ) 1.25 MG (50000 UNIT) CAPS capsule Take 1 capsule (  50,000 Units total) by mouth once a week.   No facility-administered medications prior to visit.    ROS        Objective:     BP 116/80   Pulse (!) 104   Ht 5' 8 (1.727 m)   Wt 225 lb 0.6 oz (102.1 kg)   SpO2 93%   BMI 34.22 kg/m  {Vitals History (Optional):23777}  Physical Exam   No results found for any visits on 10/12/24. {Show previous labs (optional):23779}    Assessment & Plan:    Routine Health Maintenance and Physical Exam  Immunization History  Administered Date(s) Administered   Influenza,inj,Quad PF,6+ Mos 09/24/2016   Rabies, IM 07/09/2012, 07/13/2012, 07/16/2012   Rabies, intradermal 07/09/2012   Tdap 07/19/2016    Health Maintenance  Topic Date Due   COVID-19 Vaccine (1) Never done    Pneumococcal Vaccine (1 of 2 - PCV) Never done   Hepatitis B Vaccines 19-59 Average Risk (1 of 3 - 19+ 3-dose series) Never done   Influenza Vaccine  02/01/2025 (Originally 06/04/2024)   DTaP/Tdap/Td (2 - Td or Tdap) 07/19/2026   Colonoscopy  10/15/2032   Hepatitis C Screening  Completed   HIV Screening  Completed   HPV VACCINES  Aged Out   Meningococcal B Vaccine  Aged Out    Discussed health benefits of physical activity, and encouraged him to engage in regular exercise appropriate for his age and condition.  Problem List Items Addressed This Visit   None  No follow-ups on file.     Leita Longs, FNP

## 2024-10-13 ENCOUNTER — Ambulatory Visit: Payer: Self-pay

## 2024-10-13 LAB — CMP14+EGFR
ALT: 28 IU/L (ref 0–44)
AST: 16 IU/L (ref 0–40)
Albumin: 4.7 g/dL (ref 4.1–5.1)
Alkaline Phosphatase: 150 IU/L — ABNORMAL HIGH (ref 47–123)
BUN/Creatinine Ratio: 8 — ABNORMAL LOW (ref 9–20)
BUN: 8 mg/dL (ref 6–24)
Bilirubin Total: 0.4 mg/dL (ref 0.0–1.2)
CO2: 22 mmol/L (ref 20–29)
Calcium: 9.7 mg/dL (ref 8.7–10.2)
Chloride: 103 mmol/L (ref 96–106)
Creatinine, Ser: 0.96 mg/dL (ref 0.76–1.27)
Globulin, Total: 2.4 g/dL (ref 1.5–4.5)
Glucose: 113 mg/dL — ABNORMAL HIGH (ref 70–99)
Potassium: 4.3 mmol/L (ref 3.5–5.2)
Sodium: 141 mmol/L (ref 134–144)
Total Protein: 7.1 g/dL (ref 6.0–8.5)
eGFR: 98 mL/min/1.73 (ref >59)

## 2024-10-13 LAB — HEMOGLOBIN A1C
Est. average glucose Bld gHb Est-mCnc: 114 mg/dL
Hgb A1c MFr Bld: 5.6 % (ref 4.8–5.6)

## 2024-10-13 LAB — CBC WITH DIFFERENTIAL/PLATELET
Basophils Absolute: 0.1 x10E3/uL (ref 0.0–0.2)
Basos: 1 %
EOS (ABSOLUTE): 0.1 x10E3/uL (ref 0.0–0.4)
Eos: 2 %
Hematocrit: 46.4 % (ref 37.5–51.0)
Hemoglobin: 15.9 g/dL (ref 13.0–17.7)
Immature Grans (Abs): 0 x10E3/uL (ref 0.0–0.1)
Immature Granulocytes: 0 %
Lymphocytes Absolute: 1.9 x10E3/uL (ref 0.7–3.1)
Lymphs: 22 %
MCH: 31 pg (ref 26.6–33.0)
MCHC: 34.3 g/dL (ref 31.5–35.7)
MCV: 90 fL (ref 79–97)
Monocytes Absolute: 0.5 x10E3/uL (ref 0.1–0.9)
Monocytes: 6 %
Neutrophils Absolute: 5.9 x10E3/uL (ref 1.4–7.0)
Neutrophils: 69 %
Platelets: 270 x10E3/uL (ref 150–450)
RBC: 5.13 x10E6/uL (ref 4.14–5.80)
RDW: 12.6 % (ref 11.6–15.4)
WBC: 8.5 x10E3/uL (ref 3.4–10.8)

## 2024-10-13 LAB — LIPID PANEL
Chol/HDL Ratio: 3.6 ratio (ref 0.0–5.0)
Cholesterol, Total: 162 mg/dL (ref 100–199)
HDL: 45 mg/dL (ref >39)
LDL Chol Calc (NIH): 70 mg/dL (ref 0–99)
Triglycerides: 292 mg/dL — ABNORMAL HIGH (ref 0–149)
VLDL Cholesterol Cal: 47 mg/dL — ABNORMAL HIGH (ref 5–40)

## 2024-10-13 LAB — TSH+FREE T4
Free T4: 1.44 ng/dL (ref 0.82–1.77)
TSH: 2.01 u[IU]/mL (ref 0.450–4.500)

## 2024-11-04 ENCOUNTER — Encounter: Payer: Self-pay | Admitting: Gastroenterology

## 2024-11-10 ENCOUNTER — Other Ambulatory Visit (HOSPITAL_BASED_OUTPATIENT_CLINIC_OR_DEPARTMENT_OTHER): Payer: Self-pay

## 2024-11-10 ENCOUNTER — Other Ambulatory Visit (HOSPITAL_COMMUNITY): Payer: Self-pay

## 2024-11-22 ENCOUNTER — Other Ambulatory Visit (HOSPITAL_BASED_OUTPATIENT_CLINIC_OR_DEPARTMENT_OTHER): Payer: Self-pay

## 2024-11-28 MED FILL — Pantoprazole Sodium EC Tab 40 MG (Base Equiv): ORAL | 90 days supply | Qty: 180 | Fill #1 | Status: AC

## 2024-11-29 ENCOUNTER — Other Ambulatory Visit (HOSPITAL_BASED_OUTPATIENT_CLINIC_OR_DEPARTMENT_OTHER): Payer: Self-pay

## 2025-02-22 ENCOUNTER — Encounter
